# Patient Record
Sex: Female | Born: 1965 | Race: White | Hispanic: No | Marital: Married | Smoking: Former smoker
Health system: Southern US, Community
[De-identification: ages and names within clinical notes are randomized; demographics above are authoritative.]

## PROBLEM LIST (undated history)

## (undated) DIAGNOSIS — M1990 Unspecified osteoarthritis, unspecified site: Secondary | ICD-10-CM

## (undated) DIAGNOSIS — E785 Hyperlipidemia, unspecified: Secondary | ICD-10-CM

## (undated) DIAGNOSIS — D649 Anemia, unspecified: Secondary | ICD-10-CM

## (undated) DIAGNOSIS — IMO0002 Reserved for concepts with insufficient information to code with codable children: Secondary | ICD-10-CM

## (undated) DIAGNOSIS — R112 Nausea with vomiting, unspecified: Secondary | ICD-10-CM

## (undated) DIAGNOSIS — Z9889 Other specified postprocedural states: Secondary | ICD-10-CM

## (undated) DIAGNOSIS — R519 Headache, unspecified: Secondary | ICD-10-CM

## (undated) DIAGNOSIS — T7840XA Allergy, unspecified, initial encounter: Secondary | ICD-10-CM

## (undated) DIAGNOSIS — R51 Headache: Secondary | ICD-10-CM

## (undated) DIAGNOSIS — E559 Vitamin D deficiency, unspecified: Secondary | ICD-10-CM

## (undated) DIAGNOSIS — G629 Polyneuropathy, unspecified: Secondary | ICD-10-CM

## (undated) DIAGNOSIS — I10 Essential (primary) hypertension: Secondary | ICD-10-CM

## (undated) HISTORY — DX: Headache, unspecified: R51.9

## (undated) HISTORY — DX: Allergy, unspecified, initial encounter: T78.40XA

## (undated) HISTORY — DX: Anemia, unspecified: D64.9

## (undated) HISTORY — DX: Polyneuropathy, unspecified: G62.9

## (undated) HISTORY — DX: Headache: R51

## (undated) HISTORY — PX: DIAGNOSTIC LAPAROSCOPY: SUR761

## (undated) HISTORY — PX: CERVICAL DISC SURGERY: SHX588

## (undated) HISTORY — DX: Essential (primary) hypertension: I10

## (undated) HISTORY — DX: Hyperlipidemia, unspecified: E78.5

## (undated) HISTORY — PX: TONSILLECTOMY: SUR1361

## (undated) HISTORY — DX: Vitamin D deficiency, unspecified: E55.9

## (undated) HISTORY — PX: ABDOMINAL HYSTERECTOMY: SHX81

## (undated) HISTORY — DX: Reserved for concepts with insufficient information to code with codable children: IMO0002

---

## 1999-04-16 ENCOUNTER — Other Ambulatory Visit: Admission: RE | Admit: 1999-04-16 | Discharge: 1999-04-16 | Payer: Self-pay | Admitting: Obstetrics & Gynecology

## 1999-10-11 ENCOUNTER — Inpatient Hospital Stay (HOSPITAL_COMMUNITY): Admission: AD | Admit: 1999-10-11 | Discharge: 1999-10-11 | Payer: Self-pay | Admitting: Obstetrics & Gynecology

## 2000-08-12 ENCOUNTER — Other Ambulatory Visit: Admission: RE | Admit: 2000-08-12 | Discharge: 2000-08-12 | Payer: Self-pay | Admitting: Family Medicine

## 2001-09-26 ENCOUNTER — Emergency Department (HOSPITAL_COMMUNITY): Admission: EM | Admit: 2001-09-26 | Discharge: 2001-09-27 | Payer: Self-pay | Admitting: Emergency Medicine

## 2001-09-27 ENCOUNTER — Encounter: Payer: Self-pay | Admitting: Emergency Medicine

## 2001-12-13 ENCOUNTER — Encounter: Admission: RE | Admit: 2001-12-13 | Discharge: 2001-12-13 | Payer: Self-pay | Admitting: Obstetrics & Gynecology

## 2001-12-13 ENCOUNTER — Encounter: Payer: Self-pay | Admitting: Obstetrics & Gynecology

## 2002-09-27 ENCOUNTER — Other Ambulatory Visit: Admission: RE | Admit: 2002-09-27 | Discharge: 2002-09-27 | Payer: Self-pay | Admitting: Obstetrics & Gynecology

## 2004-02-08 ENCOUNTER — Emergency Department (HOSPITAL_COMMUNITY): Admission: EM | Admit: 2004-02-08 | Discharge: 2004-02-08 | Payer: Self-pay | Admitting: Emergency Medicine

## 2004-07-17 ENCOUNTER — Encounter: Admission: RE | Admit: 2004-07-17 | Discharge: 2004-07-17 | Payer: Self-pay | Admitting: Family Medicine

## 2004-08-04 ENCOUNTER — Emergency Department (HOSPITAL_COMMUNITY): Admission: EM | Admit: 2004-08-04 | Discharge: 2004-08-05 | Payer: Self-pay | Admitting: Emergency Medicine

## 2004-10-08 ENCOUNTER — Other Ambulatory Visit: Admission: RE | Admit: 2004-10-08 | Discharge: 2004-10-08 | Payer: Self-pay | Admitting: Obstetrics & Gynecology

## 2005-04-16 ENCOUNTER — Encounter: Admission: RE | Admit: 2005-04-16 | Discharge: 2005-04-16 | Payer: Self-pay | Admitting: Neurosurgery

## 2005-08-01 ENCOUNTER — Ambulatory Visit (HOSPITAL_COMMUNITY): Admission: RE | Admit: 2005-08-01 | Discharge: 2005-08-01 | Payer: Self-pay | Admitting: Neurosurgery

## 2006-12-16 ENCOUNTER — Encounter: Admission: RE | Admit: 2006-12-16 | Discharge: 2006-12-16 | Payer: Self-pay | Admitting: Neurosurgery

## 2007-12-13 ENCOUNTER — Encounter: Admission: RE | Admit: 2007-12-13 | Discharge: 2007-12-13 | Payer: Self-pay | Admitting: Obstetrics & Gynecology

## 2010-04-06 ENCOUNTER — Encounter: Payer: Self-pay | Admitting: Internal Medicine

## 2010-08-02 NOTE — Op Note (Signed)
[name], [name]              ACCOUNT NO.:  [id number]   MEDICAL RECORD NO.:  [id number]          PATIENT TYPE:  OIB   LOCATION:  3007                         FACILITY:  [facility]   PHYSICIAN:  Kathaleen Maser. Pool, M.D.    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   PROCEDURE NAME:  C6-7 anterior cervical dissection and fusion with allograft  surgical plating.   SURGEON:  Kathaleen Maser. Pool, M.D.   ASSISTANT:  Tia Alert, MD   ANESTHESIA:  General endotracheal.   INDICATIONS FOR PROCEDURE:  [name] Smith is a 45 year old female with a  history of chronic neck and right scapular pain with some intermittent right  upper extremity radiation.  She has failed a very long and exhaustive course  of conservative management.  Workup demonstrated evidence of a rightward C6-  7 disk herniation.  The patient has been counseled as to her options.  She  has decided to proceed with C6-7 anterior cervical diskectomy with fusion  allograft surgical plating in the hopes of improving her symptoms.   OPERATIVE NOTE:  The patient is taken to the operating room and placed on  the table in the supine position after an adequate level of anesthesia was  achieved.  The patient was positioned with her neck slightly extended and  held in place with halter traction.  The patient's anterior cervical region  was prepped and draped sterilely. A 10-blade was used to make a linear skin  incision overlying the C6-7 interspace.  This was carried down sharply to  the platysma.  The platysma was then divided vertically; and dissection  proceeded along the medial border of the sternocleidomastoid muscle and  carotid sheath.  The trachea and esophagus were mobilized  and retracted  towards the left. The prevertebral fascia was stripped off the anterior  __________   The longus coli muscles, were then elevated bilaterally using electrocautery  to the deep soft tissue space. Intraoperative fluoroscopy was used and the  C6-7 was confirmed.  The disk space was then incised with a 15-blade in a  rectangular fashion.  A wide disk space plane was then achieved using  pituitary rongeurs,  Kerrison rongeurs, and a high-speed drill.  All  elements of the disk were removed down to the level of the posterior  annulus.  Microscope was then brought into the field to carry out the  remainder of the diskectomy.  The remaining aspects of annulus and  osteophytes were removed using the high-speed drill, down to the level of  the posterior longitudinal ligament.  The posterior longitudinal ligament  was elevated and resected in piece meal fashion using Kerrison rongeurs  along the thecal sac and exiting.  C7 nerve roots were identified.  Wide  decompression was then performed undercutting the bodies of C6 an C7 and  wide anterior foraminotomies were performed along  the first C6 and C7 nerve  roots bilaterally.   The wound was then irrigated with antibiotic solution. Gelfoam was placed  topically for hemostasis and it was found to be good.  A 5 mm LifeNet  allograft wedge was then packed into place and recessed approximately 1 mm  from the anterior cortical margin of C6 and C7.  A 12-mm outliner anterior  cervical plate was then placed under fluoroscopic guidance using 13-mm bare  bone screws to each of the above levels.  All screws were given a final  tightening and found to be solid in bone.  A locking screw was then placed  at both levels.  Final images revealed good position of bone grafts and  hardware with __________ of the lumbar spine.  The wound was then irrigated  with antibiotic solution.  Gelfoam was placed in the operative incision and  found to be god.   Microscope and retractors were removed. Hemostasis was  achieved with electrocautery.  The wound was then closed in layers with  Vicryl sutures.  Steri-Strips and a sterile dressing were applied. The were  no perioperative complications.  The patient tolerated procedure well; and  she returns to the recovery room in stable condition.           ______________________________  Kathaleen Maser Pool, M.D.     HAP/MEDQ  D:  08/01/2005  T:  08/01/2005  Job:  [id number]

## 2011-01-08 ENCOUNTER — Other Ambulatory Visit: Payer: Self-pay | Admitting: Internal Medicine

## 2011-01-08 ENCOUNTER — Ambulatory Visit
Admission: RE | Admit: 2011-01-08 | Discharge: 2011-01-08 | Disposition: A | Discharge status: Home / Self Care | Payer: 59 | Source: Ambulatory Visit | Attending: Internal Medicine | Admitting: Internal Medicine

## 2011-01-08 DIAGNOSIS — R519 Headache, unspecified: Secondary | ICD-10-CM

## 2011-01-09 ENCOUNTER — Other Ambulatory Visit: Payer: Self-pay | Admitting: Internal Medicine

## 2011-01-09 DIAGNOSIS — R519 Headache, unspecified: Secondary | ICD-10-CM

## 2011-01-11 ENCOUNTER — Ambulatory Visit
Admission: RE | Admit: 2011-01-11 | Discharge: 2011-01-11 | Disposition: A | Discharge status: Home / Self Care | Payer: 59 | Source: Ambulatory Visit | Attending: Internal Medicine | Admitting: Internal Medicine

## 2011-01-11 DIAGNOSIS — R519 Headache, unspecified: Secondary | ICD-10-CM

## 2011-03-18 LAB — HM DEXA SCAN

## 2012-02-04 ENCOUNTER — Other Ambulatory Visit: Payer: Self-pay | Admitting: Neurosurgery

## 2012-03-16 NOTE — Pre-Procedure Instructions (Addendum)
20 [name] Smith  03/16/2012   Your procedure is scheduled on:  Monday, January 13th   Report to [facility] at 5:30 AM.  Call this number if you have problems the morning of surgery: [phone]   Remember:   Do not eat food or drink any liquids:After Midnight Sunday.    Take these medicines the morning of surgery with A SIP OF WATER: Hydrocodone   Do not wear jewelry, make-up or nail polish.  Do not wear lotions, powders, or perfumes. You may  NOT wear deodorant.  Ladies-- Do not shave 48 hours prior to surgery.              Do not bring valuables to the hospital.             Contacts, dentures or bridgework may not be worn into surgery.   Leave suitcase in the car. After surgery it may be brought to your room.  For patients admitted to the hospital, checkout time is 11:00 AM the day of discharge.   Patients discharged the day of surgery will not be allowed to drive home.   Name and phone number of your driver:    Special Instructions: Shower using CHG 2 nights before surgery and the night before surgery.  If you shower the day of surgery use CHG.  Use special wash - you have one bottle of CHG for all showers.  You should use approximately 1/3 of the bottle for each shower.   Please read over the following fact sheets that you were given: Pain Booklet, Blood Transfusion Information, MRSA Information and Surgical Site Infection Prevention

## 2012-03-18 ENCOUNTER — Encounter (HOSPITAL_COMMUNITY)
Admission: RE | Admit: 2012-03-18 | Discharge: 2012-03-18 | Disposition: A | Discharge status: Home / Self Care | Payer: 59 | Source: Ambulatory Visit | Attending: Neurosurgery | Admitting: Neurosurgery

## 2012-03-18 ENCOUNTER — Encounter (HOSPITAL_COMMUNITY): Payer: Self-pay

## 2012-03-18 HISTORY — DX: Nausea with vomiting, unspecified: Z98.890

## 2012-03-18 HISTORY — DX: Unspecified osteoarthritis, unspecified site: M19.90

## 2012-03-18 HISTORY — DX: Nausea with vomiting, unspecified: R11.2

## 2012-03-18 LAB — CBC WITH DIFFERENTIAL/PLATELET
Basophils Absolute: 0.1 10*3/uL (ref 0.0–0.1)
Basophils Relative: 1 % (ref 0–1)
Eosinophils Absolute: 0.3 10*3/uL (ref 0.0–0.7)
Eosinophils Relative: 6 % — ABNORMAL HIGH (ref 0–5)
HCT: 39.2 % (ref 36.0–46.0)
Hemoglobin: 13.3 g/dL (ref 12.0–15.0)
Lymphocytes Relative: 37 % (ref 12–46)
Lymphs Abs: 2 10*3/uL (ref 0.7–4.0)
MCH: 29.8 pg (ref 26.0–34.0)
MCHC: 33.9 g/dL (ref 30.0–36.0)
MCV: 87.9 fL (ref 78.0–100.0)
Monocytes Absolute: 0.4 10*3/uL (ref 0.1–1.0)
Monocytes Relative: 8 % (ref 3–12)
Neutro Abs: 2.6 10*3/uL (ref 1.7–7.7)
Neutrophils Relative %: 49 % (ref 43–77)
Platelets: 290 10*3/uL (ref 150–400)
RBC: 4.46 MIL/uL (ref 3.87–5.11)
RDW: 12.7 % (ref 11.5–15.5)
WBC: 5.3 10*3/uL (ref 4.0–10.5)

## 2012-03-18 LAB — SURGICAL PCR SCREEN
MRSA, PCR: NEGATIVE
Staphylococcus aureus: NEGATIVE

## 2012-03-18 NOTE — Progress Notes (Signed)
1430.Marland KitchenMarland KitchenTHURSDAY...the patient DOES not want to wear blue blood band x 6 days.  She has not had blood transfusion and understands she will have sample drawn the dos....da

## 2012-03-28 MED ORDER — CEFAZOLIN SODIUM-DEXTROSE 2-3 GM-% IV SOLR
2.0000 g | INTRAVENOUS | Status: DC
  Filled 2012-03-28: qty 50

## 2012-03-28 NOTE — Progress Notes (Signed)
Patient has penicillin allergy.

## 2012-03-29 ENCOUNTER — Encounter (HOSPITAL_COMMUNITY): Payer: Self-pay | Admitting: Neurosurgery

## 2012-03-29 ENCOUNTER — Encounter (HOSPITAL_COMMUNITY): Payer: Self-pay | Admitting: Anesthesiology

## 2012-03-29 ENCOUNTER — Inpatient Hospital Stay (HOSPITAL_COMMUNITY): Payer: 59

## 2012-03-29 ENCOUNTER — Ambulatory Visit (HOSPITAL_COMMUNITY): Payer: 59 | Admitting: Anesthesiology

## 2012-03-29 ENCOUNTER — Encounter (HOSPITAL_COMMUNITY)
Admission: RE | Disposition: A | Discharge status: Home / Self Care | Payer: Self-pay | Source: Ambulatory Visit | Attending: Neurosurgery

## 2012-03-29 ENCOUNTER — Inpatient Hospital Stay (HOSPITAL_COMMUNITY)
Admission: RE | Admit: 2012-03-29 | Discharge: 2012-03-30 | DRG: 473 | Disposition: A | Discharge status: Home / Self Care | Payer: 59 | Source: Ambulatory Visit | Attending: Neurosurgery | Admitting: Neurosurgery

## 2012-03-29 DIAGNOSIS — Z880 Allergy status to penicillin: Secondary | ICD-10-CM

## 2012-03-29 DIAGNOSIS — M19049 Primary osteoarthritis, unspecified hand: Secondary | ICD-10-CM | POA: Diagnosis present

## 2012-03-29 DIAGNOSIS — Z87891 Personal history of nicotine dependence: Secondary | ICD-10-CM

## 2012-03-29 DIAGNOSIS — Z881 Allergy status to other antibiotic agents status: Secondary | ICD-10-CM

## 2012-03-29 DIAGNOSIS — M5000 Cervical disc disorder with myelopathy, unspecified cervical region: Secondary | ICD-10-CM | POA: Diagnosis present

## 2012-03-29 DIAGNOSIS — Z981 Arthrodesis status: Secondary | ICD-10-CM

## 2012-03-29 HISTORY — PX: ANTERIOR CERVICAL DECOMP/DISCECTOMY FUSION: SHX1161

## 2012-03-29 LAB — TYPE AND SCREEN
ABO/RH(D): A POS
Antibody Screen: NEGATIVE

## 2012-03-29 LAB — ABO/RH: ABO/RH(D): A POS

## 2012-03-29 SURGERY — ANTERIOR CERVICAL DECOMPRESSION/DISCECTOMY FUSION 1 LEVEL
Anesthesia: General | Site: Neck | Laterality: Bilateral | Wound class: Clean

## 2012-03-29 MED ORDER — DEXAMETHASONE SODIUM PHOSPHATE 10 MG/ML IJ SOLN: 10.0000 mg | INTRAMUSCULAR | Status: DC

## 2012-03-29 MED ORDER — HYDROCODONE-ACETAMINOPHEN 5-325 MG PO TABS: 1.0000 | ORAL_TABLET | ORAL | Status: DC | PRN

## 2012-03-29 MED ORDER — THROMBIN 5000 UNITS EX SOLR
OROMUCOSAL | Status: DC | PRN
  Administered 2012-03-29: 09:00:00 via TOPICAL

## 2012-03-29 MED ORDER — BACITRACIN 50000 UNITS IM SOLR
INTRAMUSCULAR | Status: AC
  Filled 2012-03-29: qty 1

## 2012-03-29 MED ORDER — POLYETHYLENE GLYCOL 3350 17 G PO PACK
17.0000 g | PACK | Freq: Every day | ORAL | Status: DC | PRN
  Filled 2012-03-29: qty 1

## 2012-03-29 MED ORDER — HYDROMORPHONE HCL PF 1 MG/ML IJ SOLN
INTRAMUSCULAR | Status: AC
  Filled 2012-03-29: qty 1

## 2012-03-29 MED ORDER — HYDROMORPHONE HCL PF 1 MG/ML IJ SOLN
0.5000 mg | INTRAMUSCULAR | Status: DC | PRN
  Administered 2012-03-29: 1 mg via INTRAVENOUS
  Filled 2012-03-29 (×2): qty 1

## 2012-03-29 MED ORDER — ONDANSETRON HCL 4 MG/2ML IJ SOLN: 4.0000 mg | Freq: Four times a day (QID) | INTRAMUSCULAR | Status: DC | PRN

## 2012-03-29 MED ORDER — HEMOSTATIC AGENTS (NO CHARGE) OPTIME
TOPICAL | Status: DC | PRN
  Administered 2012-03-29: 1 via TOPICAL

## 2012-03-29 MED ORDER — ALUM & MAG HYDROXIDE-SIMETH 200-200-20 MG/5ML PO SUSP: 30.0000 mL | Freq: Four times a day (QID) | ORAL | Status: DC | PRN

## 2012-03-29 MED ORDER — DEXAMETHASONE SODIUM PHOSPHATE 10 MG/ML IJ SOLN
INTRAMUSCULAR | Status: AC
  Administered 2012-03-29: 10 mg via INTRAVENOUS
  Filled 2012-03-29: qty 1

## 2012-03-29 MED ORDER — PHENOL 1.4 % MT LIQD: 1.0000 | OROMUCOSAL | Status: DC | PRN

## 2012-03-29 MED ORDER — LACTATED RINGERS IV SOLN
INTRAVENOUS | Status: DC | PRN
  Administered 2012-03-29 (×2): via INTRAVENOUS

## 2012-03-29 MED ORDER — SODIUM CHLORIDE 0.9 % IV SOLN: 250.0000 mL | INTRAVENOUS | Status: DC

## 2012-03-29 MED ORDER — ARTIFICIAL TEARS OP OINT
TOPICAL_OINTMENT | OPHTHALMIC | Status: DC | PRN
  Administered 2012-03-29: 1 via OPHTHALMIC

## 2012-03-29 MED ORDER — ACETAMINOPHEN 650 MG RE SUPP: 650.0000 mg | RECTAL | Status: DC | PRN

## 2012-03-29 MED ORDER — OXYCODONE-ACETAMINOPHEN 5-325 MG PO TABS: 1.0000 | ORAL_TABLET | ORAL | Status: DC | PRN

## 2012-03-29 MED ORDER — ONDANSETRON HCL 4 MG/2ML IJ SOLN
4.0000 mg | INTRAMUSCULAR | Status: DC | PRN
  Administered 2012-03-29: 4 mg via INTRAVENOUS
  Filled 2012-03-29: qty 2

## 2012-03-29 MED ORDER — SENNA 8.6 MG PO TABS
1.0000 | ORAL_TABLET | Freq: Two times a day (BID) | ORAL | Status: DC
  Filled 2012-03-29 (×2): qty 1

## 2012-03-29 MED ORDER — FENTANYL CITRATE 0.05 MG/ML IJ SOLN
INTRAMUSCULAR | Status: DC | PRN
  Administered 2012-03-29 (×2): 100 ug via INTRAVENOUS

## 2012-03-29 MED ORDER — 0.9 % SODIUM CHLORIDE (POUR BTL) OPTIME
TOPICAL | Status: DC | PRN
  Administered 2012-03-29: 1000 mL

## 2012-03-29 MED ORDER — LIDOCAINE HCL (CARDIAC) 20 MG/ML IV SOLN
INTRAVENOUS | Status: DC | PRN
  Administered 2012-03-29: 100 mg via INTRAVENOUS

## 2012-03-29 MED ORDER — BISACODYL 10 MG RE SUPP: 10.0000 mg | Freq: Every day | RECTAL | Status: DC | PRN

## 2012-03-29 MED ORDER — GLYCOPYRROLATE 0.2 MG/ML IJ SOLN
INTRAMUSCULAR | Status: DC | PRN
  Administered 2012-03-29: .8 mg via INTRAVENOUS

## 2012-03-29 MED ORDER — SODIUM CHLORIDE 0.9 % IJ SOLN: 3.0000 mL | Freq: Two times a day (BID) | INTRAMUSCULAR | Status: DC

## 2012-03-29 MED ORDER — VANCOMYCIN HCL IN DEXTROSE 1-5 GM/200ML-% IV SOLN
1000.0000 mg | Freq: Once | INTRAVENOUS | Status: AC
  Administered 2012-03-29: 1000 mg via INTRAVENOUS
  Filled 2012-03-29: qty 200

## 2012-03-29 MED ORDER — OXYCODONE HCL 5 MG/5ML PO SOLN: 5.0000 mg | Freq: Once | ORAL | Status: DC | PRN

## 2012-03-29 MED ORDER — MAGNESIUM CITRATE PO SOLN
1.0000 | Freq: Once | ORAL | Status: AC | PRN
  Filled 2012-03-29: qty 296

## 2012-03-29 MED ORDER — SODIUM CHLORIDE 0.9 % IR SOLN
Status: DC | PRN
  Administered 2012-03-29: 08:00:00

## 2012-03-29 MED ORDER — SUCCINYLCHOLINE CHLORIDE 20 MG/ML IJ SOLN
INTRAMUSCULAR | Status: DC | PRN
  Administered 2012-03-29: 120 mg via INTRAVENOUS

## 2012-03-29 MED ORDER — ZOLPIDEM TARTRATE 5 MG PO TABS: 5.0000 mg | ORAL_TABLET | Freq: Every evening | ORAL | Status: DC | PRN

## 2012-03-29 MED ORDER — THROMBIN 5000 UNITS EX KIT
PACK | CUTANEOUS | Status: DC | PRN
  Administered 2012-03-29 (×2): 5000 [IU] via TOPICAL

## 2012-03-29 MED ORDER — NEOSTIGMINE METHYLSULFATE 1 MG/ML IJ SOLN
INTRAMUSCULAR | Status: DC | PRN
  Administered 2012-03-29: 5 mg via INTRAVENOUS

## 2012-03-29 MED ORDER — ACETAMINOPHEN 325 MG PO TABS: 650.0000 mg | ORAL_TABLET | ORAL | Status: DC | PRN

## 2012-03-29 MED ORDER — OXYCODONE HCL 5 MG PO TABS: 5.0000 mg | ORAL_TABLET | Freq: Once | ORAL | Status: DC | PRN

## 2012-03-29 MED ORDER — MIDAZOLAM HCL 5 MG/5ML IJ SOLN
INTRAMUSCULAR | Status: DC | PRN
  Administered 2012-03-29: 2 mg via INTRAVENOUS

## 2012-03-29 MED ORDER — CYCLOBENZAPRINE HCL 10 MG PO TABS: 10.0000 mg | ORAL_TABLET | Freq: Three times a day (TID) | ORAL | Status: DC | PRN

## 2012-03-29 MED ORDER — MENTHOL 3 MG MT LOZG: 1.0000 | LOZENGE | OROMUCOSAL | Status: DC | PRN

## 2012-03-29 MED ORDER — HYDROMORPHONE HCL PF 1 MG/ML IJ SOLN
0.2500 mg | INTRAMUSCULAR | Status: DC | PRN
  Administered 2012-03-29 (×4): 0.5 mg via INTRAVENOUS

## 2012-03-29 MED ORDER — SODIUM CHLORIDE 0.9 % IV SOLN
INTRAVENOUS | Status: AC
  Administered 2012-03-29: 08:00:00 via INTRAVENOUS
  Filled 2012-03-29: qty 500

## 2012-03-29 MED ORDER — ROCURONIUM BROMIDE 100 MG/10ML IV SOLN
INTRAVENOUS | Status: DC | PRN
  Administered 2012-03-29: 50 mg via INTRAVENOUS

## 2012-03-29 MED ORDER — SODIUM CHLORIDE 0.9 % IJ SOLN: 3.0000 mL | INTRAMUSCULAR | Status: DC | PRN

## 2012-03-29 MED ORDER — VANCOMYCIN HCL IN DEXTROSE 1-5 GM/200ML-% IV SOLN
1000.0000 mg | Freq: Once | INTRAVENOUS | Status: AC
  Administered 2012-03-29: 1000 mg via INTRAVENOUS

## 2012-03-29 MED ORDER — ONDANSETRON HCL 4 MG/2ML IJ SOLN
INTRAMUSCULAR | Status: DC | PRN
  Administered 2012-03-29 (×2): 4 mg via INTRAVENOUS

## 2012-03-29 SURGICAL SUPPLY — 59 items
APL SKNCLS STERI-STRIP NONHPOA (GAUZE/BANDAGES/DRESSINGS) ×1
BAG DECANTER FOR FLEXI CONT (MISCELLANEOUS) ×2 IMPLANT
BENZOIN TINCTURE PRP APPL 2/3 (GAUZE/BANDAGES/DRESSINGS) ×2 IMPLANT
BRUSH SCRUB EZ PLAIN DRY (MISCELLANEOUS) ×2 IMPLANT
BUR MATCHSTICK NEURO 3.0 LAGG (BURR) ×2 IMPLANT
CANISTER SUCTION 2500CC (MISCELLANEOUS) ×2 IMPLANT
CLOTH BEACON ORANGE TIMEOUT ST (SAFETY) ×2 IMPLANT
CONT SPEC 4OZ CLIKSEAL STRL BL (MISCELLANEOUS) ×2 IMPLANT
DRAPE C-ARM 42X72 X-RAY (DRAPES) ×4 IMPLANT
DRAPE LAPAROTOMY 100X72 PEDS (DRAPES) ×2 IMPLANT
DRAPE MICROSCOPE ZEISS OPMI (DRAPES) ×2 IMPLANT
DRAPE POUCH INSTRU U-SHP 10X18 (DRAPES) ×2 IMPLANT
DRILL BIT (BIT) ×2 IMPLANT
DURAPREP 6ML APPLICATOR 50/CS (WOUND CARE) ×1 IMPLANT
ELECT COATED BLADE 2.86 ST (ELECTRODE) ×2 IMPLANT
ELECT REM PT RETURN 9FT ADLT (ELECTROSURGICAL) ×2
ELECTRODE REM PT RTRN 9FT ADLT (ELECTROSURGICAL) ×1 IMPLANT
GAUZE SPONGE 4X4 16PLY XRAY LF (GAUZE/BANDAGES/DRESSINGS) IMPLANT
GLOVE BIO SURGEON STRL SZ8 (GLOVE) ×1 IMPLANT
GLOVE BIOGEL PI IND STRL 7.0 (GLOVE) IMPLANT
GLOVE BIOGEL PI IND STRL 7.5 (GLOVE) IMPLANT
GLOVE BIOGEL PI INDICATOR 7.0 (GLOVE) ×1
GLOVE BIOGEL PI INDICATOR 7.5 (GLOVE) ×1
GLOVE ECLIPSE 8.5 STRL (GLOVE) ×2 IMPLANT
GLOVE EXAM NITRILE LRG STRL (GLOVE) IMPLANT
GLOVE EXAM NITRILE MD LF STRL (GLOVE) IMPLANT
GLOVE EXAM NITRILE XL STR (GLOVE) IMPLANT
GLOVE EXAM NITRILE XS STR PU (GLOVE) IMPLANT
GLOVE SS BIOGEL STRL SZ 6.5 (GLOVE) IMPLANT
GLOVE SUPERSENSE BIOGEL SZ 6.5 (GLOVE) ×4
GOWN BRE IMP SLV AUR LG STRL (GOWN DISPOSABLE) ×2 IMPLANT
GOWN BRE IMP SLV AUR XL STRL (GOWN DISPOSABLE) ×1 IMPLANT
GOWN STRL REIN 2XL LVL4 (GOWN DISPOSABLE) IMPLANT
HEAD HALTER (SOFTGOODS) ×2 IMPLANT
HEMOSTAT POWDER SURGIFOAM 1G (HEMOSTASIS) ×1 IMPLANT
KIT BASIN OR (CUSTOM PROCEDURE TRAY) ×2 IMPLANT
KIT ROOM TURNOVER OR (KITS) ×2 IMPLANT
NDL SPNL 20GX3.5 QUINCKE YW (NEEDLE) ×1 IMPLANT
NEEDLE SPNL 20GX3.5 QUINCKE YW (NEEDLE) ×2 IMPLANT
NS IRRIG 1000ML POUR BTL (IV SOLUTION) ×2 IMPLANT
PACK LAMINECTOMY NEURO (CUSTOM PROCEDURE TRAY) ×2 IMPLANT
PAD ARMBOARD 7.5X6 YLW CONV (MISCELLANEOUS) ×6 IMPLANT
PLATE 23MM (Plate) ×1 IMPLANT
RUBBERBAND STERILE (MISCELLANEOUS) ×4 IMPLANT
SCREW ST 13X4XST VA NS SPNE (Screw) IMPLANT
SCREW ST VAR 4 ATL (Screw) ×8 IMPLANT
SPACER BONE CORNERSTONE 6X14 (Orthopedic Implant) ×1 IMPLANT
SPONGE GAUZE 4X4 12PLY (GAUZE/BANDAGES/DRESSINGS) ×2 IMPLANT
SPONGE INTESTINAL PEANUT (DISPOSABLE) ×2 IMPLANT
SPONGE SURGIFOAM ABS GEL SZ50 (HEMOSTASIS) ×2 IMPLANT
STRIP CLOSURE SKIN 1/2X4 (GAUZE/BANDAGES/DRESSINGS) ×2 IMPLANT
SUT PDS AB 5-0 P3 18 (SUTURE) ×2 IMPLANT
SUT VIC AB 3-0 SH 8-18 (SUTURE) ×2 IMPLANT
SYR 20ML ECCENTRIC (SYRINGE) ×2 IMPLANT
TAPE CLOTH 4X10 WHT NS (GAUZE/BANDAGES/DRESSINGS) ×1 IMPLANT
TAPE CLOTH SURG 4X10 WHT LF (GAUZE/BANDAGES/DRESSINGS) ×1 IMPLANT
TOWEL OR 17X24 6PK STRL BLUE (TOWEL DISPOSABLE) ×2 IMPLANT
TOWEL OR 17X26 10 PK STRL BLUE (TOWEL DISPOSABLE) ×2 IMPLANT
WATER STERILE IRR 1000ML POUR (IV SOLUTION) ×2 IMPLANT

## 2012-03-29 NOTE — Progress Notes (Signed)
Spoke with Dr Jordan Likes and informed him of pt feeling nauseated and having diarrhea since last night.  He stated he will see her in holding.  Also informed him of pts allergy to Penicillin and new order received.

## 2012-03-29 NOTE — Brief Op Note (Signed)
03/29/2012  9:17 AM  PATIENT:  [name] Smith  47 y.o. female  PRE-OPERATIVE DIAGNOSIS:  Cervical four-five herniated nucleus pulposus  POST-OPERATIVE DIAGNOSIS:  Cervical four-five herniated nucleus pulposus  PROCEDURE:  Procedure(s) (LRB) with comments: ANTERIOR CERVICAL DECOMPRESSION/DISCECTOMY FUSION 1 LEVEL (Bilateral) - Cervical four-five anterior cervical discectomy with fusion   SURGEON:  Surgeon(s) and Role:    * Temple Pacini, MD - Primary    * Mariam Dollar, MD - Assisting  PHYSICIAN ASSISTANT:   ASSISTANTS:    ANESTHESIA:   Gen.  EBL:  Total I/O In: 1000 [I.V.:1000] Out: 100 [Blood:100]  BLOOD ADMINISTERED:none  DRAINS: none   LOCAL MEDICATIONS USED:  NONE  SPECIMEN:  No Specimen  DISPOSITION OF SPECIMEN:  N/A  COUNTS:  YES  TOURNIQUET:  * No tourniquets in log *  DICTATION: .Dragon Dictation  PLAN OF CARE: Admit to inpatient   PATIENT DISPOSITION:  PACU - hemodynamically stable.   Delay start of Pharmacological VTE agent (>24hrs) due to surgical blood loss or risk of bleeding: yes

## 2012-03-29 NOTE — Anesthesia Preprocedure Evaluation (Addendum)
Anesthesia Evaluation  Patient identified by MRN, date of birth, ID band Patient awake    Reviewed: Allergy & Precautions, H&P , NPO status , Patient's Chart, lab work & pertinent test results  History of Anesthesia Complications (+) PONV  Airway Mallampati: II TM Distance: <3 FB Neck ROM: full    Dental  (+) Dental Advisory Given   Pulmonary former smoker,          Cardiovascular     Neuro/Psych    GI/Hepatic   Endo/Other    Renal/GU      Musculoskeletal  (+) Arthritis -,   Abdominal   Peds  Hematology   Anesthesia Other Findings   Reproductive/Obstetrics                          Anesthesia Physical Anesthesia Plan  ASA: II  Anesthesia Plan: General   Post-op Pain Management:    Induction: Intravenous  Airway Management Planned: Oral ETT  Additional Equipment:   Intra-op Plan:   Post-operative Plan: Extubation in OR  Informed Consent: I have reviewed the patients History and Physical, chart, labs and discussed the procedure including the risks, benefits and alternatives for the proposed anesthesia with the patient or authorized representative who has indicated his/her understanding and acceptance.     Plan Discussed with: CRNA and Surgeon  Anesthesia Plan Comments:         Anesthesia Quick Evaluation

## 2012-03-29 NOTE — Anesthesia Procedure Notes (Signed)
Procedure Name: Intubation Date/Time: 03/29/2012 8:05 AM Performed by: Sherie Don Pre-anesthesia Checklist: Patient identified, Emergency Drugs available, Suction available, Patient being monitored and Timeout performed Patient Re-evaluated:Patient Re-evaluated prior to inductionOxygen Delivery Method: Circle system utilized Preoxygenation: Pre-oxygenation with 100% oxygen Intubation Type: IV induction and Rapid sequence Laryngoscope Size: Mac and 3 Grade View: Grade III Tube type: Oral Tube size: 7.0 mm Number of attempts: 1 Airway Equipment and Method: Stylet Placement Confirmation: ETT inserted through vocal cords under direct vision,  positive ETCO2 and breath sounds checked- equal and bilateral (posterior arytenoids anterir larynx) Secured at: 22 cm Tube secured with: Tape Dental Injury: Teeth and Oropharynx as per pre-operative assessment  Difficulty Due To: Difficult Airway- due to anterior larynx Future Recommendations: Recommend- induction with short-acting agent, and alternative techniques readily available

## 2012-03-29 NOTE — Transfer of Care (Signed)
Immediate Anesthesia Transfer of Care Note  Patient: [name] Smith  Procedure(s) Performed: Procedure(s) (LRB) with comments: ANTERIOR CERVICAL DECOMPRESSION/DISCECTOMY FUSION 1 LEVEL (Bilateral) - Cervical four-five anterior cervical discectomy with fusion   Patient Location: PACU  Anesthesia Type:General  Level of Consciousness: alert  and patient cooperative  Airway & Oxygen Therapy: Patient Spontanous Breathing and Patient connected to nasal cannula oxygen  Post-op Assessment: Report given to PACU RN and Patient moving all extremities X 4  Post vital signs: Reviewed and stable  Complications: No apparent anesthesia complications

## 2012-03-29 NOTE — Op Note (Signed)
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  Asst. Surgeon: Wynetta Emery  Anesthesia: General  Indication: 47 year old female with neck and bilateral upper extremity symptoms of pain paresthesias and weakness consistent with an early cervical myelopathy. Patient is status post previous C6-7 anterior cervical discectomy and fusion. Workup demonstrates evidence of a significant paracentral disc herniation on the right at C4-5 with spinal cord compression. Patient presents now for anterior cervical discectomy and fusion in hopes of improving her symptoms.  Operative note: After induction anesthesia, patient positioned supine with her neck slightly extended and held in place with halter traction. Patient's anterior cervical region was prepped and draped sterilely. 10 blade used to make a linear skin incision overlying the C5-6 vertebral level. This carried down sharply to the platysma. Is then divided vertically and dissection proceeded on the medial border of the sternocleidomastoid muscle and carotid sheath. Trachea and esophagus are mobilized track towards the left. Prevertebral fascia struck the anterior spinal column. Longus colli muscle belly bilaterally/Carter. Deep self-retaining traction placed intraoperative fluoroscopy is used and the C4-5 level was confirmed. The space and incised 15 blade in a rectangular fashion. Y. disc space clear was achieved using pituitary rongeurs for tobacco progress Kerrison rongeurs and a high-speed drill. All its the disc removed down to the level posterior annulus. Marked and brought field these at the remainder of the discectomy remaining aspects of annulus and osteophytes removed using a high-speed drill  down to level posterior laterally. Posterior lateral and was elevated and resected piecemeal fashion using Kerrison rongeurs. Underlying thecal sac was identified. Wide central decompression then performed by undercutting the bodies of C4 and C5. Decompression then proceeded out each neural foramen. Wide anterior foraminotomies were then performed along the course exiting C5 nerve roots bilaterally. This went very thorough decompression had been achieved. There is no his injury to thecal sac and nerve roots. Wounds that area out solution. Gelfoam was placed topically for hemostasis which was found to be good. 6 mm cornerstone allograft wedges and packed into place recessed roughly 1 mm from the anterior cortical margin. 23 mm Atlantis anterior cervical plate is then placed over the C4 and C5 levels. This infection or fluoroscopic guidance using 13 mm variable screws 2 each at both levels. All 4 screws given a final tightening be solidly within bone. Locking screws were then engaged both levels. Final images revealed good position the bone graft and hardware at proper upper level with normal lamina spine. Wounds an area and one final time. Hemostasis was ensured with bipolar try repaired was and close in layers with Vicryl sutures. Steri-Strips and sterile dressing were applied. There were no apparent locations. Patient upper well and she returns to the recovery postop.

## 2012-03-29 NOTE — H&P (Signed)
[name] Smith is an 47 y.o. female.   Chief Complaint: Neck pain bilateral upper extremity pain and numbness HPI: 47 year old female with a remote history of a C6-7 anterior cervical discectomy and fusion with allograft or plating with good overall result presents now with worsening neck pain and bilateral upper extremity numbness paresthesias and weakness consistent with an early cervical myelopathy. Patient recently began having some lower tray symptoms which are likely secondary to her spinal cord compression as well. Workup demonstrates evidence of a right paracentral disc herniation at C4-5 with significant spinal cord compression. The remainder of her cervical spine was quite healthy.    Past Medical History  Diagnosis Date  . PONV (postoperative nausea and vomiting)   . Arthritis     in hands    Past Surgical History  Procedure Date  . Diagnostic laparoscopy   . Abdominal hysterectomy   . Tonsillectomy   . Cervical disc surgery     No family history on file. Social History:  reports that she quit smoking about 7 months ago. She does not have any smokeless tobacco history on file. She reports that she does not drink alcohol or use illicit drugs.  Allergies:  Allergies  Allergen Reactions  . Erythromycin Hives  . Penicillins Hives  . Tetracyclines & Related Hives    Medications Prior to Admission  Medication Sig Dispense Refill  . Cholecalciferol (VITAMIN D) 2000 UNITS CAPS Take 2 capsules by mouth 2 (two) times daily.      Marland Kitchen HYDROcodone-acetaminophen (NORCO/VICODIN) 5-325 MG per tablet Take 1 tablet by mouth every 6 (six) hours as needed. For pain        No results found for this or any previous visit (from the past 48 hour(s)). No results found.  Review of Systems  Constitutional: Negative.   HENT: Negative.   Eyes: Negative.   Respiratory: Negative.   Cardiovascular: Negative.   Gastrointestinal: Negative.   Genitourinary: Negative.   Musculoskeletal:  Negative.   Skin: Negative.   Neurological: Negative.   Endo/Heme/Allergies: Negative.   Psychiatric/Behavioral: Negative.     Blood pressure 123/83, pulse 67, temperature 97.7 F (36.5 C), temperature source Oral, resp. rate 18, SpO2 100.00%. Physical Exam  Constitutional: She is oriented to person, place, and time. She appears well-developed and well-nourished.  HENT:  Head: Normocephalic and atraumatic.  Right Ear: External ear normal.  Left Ear: External ear normal.  Nose: Nose normal.  Mouth/Throat: Oropharynx is clear and moist.  Eyes: Conjunctivae normal and EOM are normal. Pupils are equal, round, and reactive to light. Right eye exhibits no discharge. Left eye exhibits no discharge.  Neck: Normal range of motion. Neck supple. No tracheal deviation present. No thyromegaly present.  Cardiovascular: Normal rate, regular rhythm, normal heart sounds and intact distal pulses.  Exam reveals no friction rub.   No murmur heard. Respiratory: Effort normal and breath sounds normal. No respiratory distress. She has no wheezes.  GI: Soft. Bowel sounds are normal. She exhibits no distension. There is no tenderness.  Musculoskeletal: Normal range of motion. She exhibits no edema and no tenderness.  Neurological: She is alert and oriented to person, place, and time. She has normal reflexes. She displays normal reflexes. No cranial nerve deficit. She exhibits normal muscle tone. Coordination normal.  Skin: Skin is warm and dry. No rash noted. No erythema. No pallor.  Psychiatric: She has a normal mood and affect. Her behavior is normal. Judgment and thought content normal.     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 Plan C4-5 anterior cervical setting fusion without after plating. Risks and benefits been explained. Patient wishes to proceed.  Verdie Wilms A 03/29/2012, 7:40 AM

## 2012-03-29 NOTE — Progress Notes (Signed)
ANTIBIOTIC CONSULT NOTE - INITIAL  Pharmacy Consult for Vancomycin  Indication: Surgical prophylaxis  Allergies  Allergen Reactions  . Erythromycin Hives  . Penicillins Hives  . Tetracyclines & Related Hives    Patient Measurements:   Adjusted Body Weight: 61 kg  Vital Signs: Temp: 97.7 F (36.5 C) (01/13 1116) Temp src: Oral (01/13 0603) BP: 132/79 mmHg (01/13 1116) Pulse Rate: 72  (01/13 1116) Intake/Output from previous day:   Intake/Output from this shift: Total I/O In: 2100 [I.V.:2100] Out: 100 [Blood:100]  Labs: No results found for this basename: WBC:3,HGB:3,PLT:3,LABCREA:3,CREATININE:3 in the last 72 hours CrCl is unknown because no creatinine reading has been taken and the patient has no height on file. No results found for this basename: VANCOTROUGH:2,VANCOPEAK:2,VANCORANDOM:2,GENTTROUGH:2,GENTPEAK:2,GENTRANDOM:2,TOBRATROUGH:2,TOBRAPEAK:2,TOBRARND:2,AMIKACINPEAK:2,AMIKACINTROU:2,AMIKACIN:2, in the last 72 hours   Microbiology: Recent Results (from the past 720 hour(s))  SURGICAL PCR SCREEN     Status: Normal   Collection Time   03/18/12  2:42 PM      Component Value Range Status Comment   MRSA, PCR NEGATIVE  NEGATIVE Final    Staphylococcus aureus NEGATIVE  NEGATIVE Final     Medical History: Past Medical History  Diagnosis Date  . PONV (postoperative nausea and vomiting)   . Arthritis     in hands    Assessment: 89 YOF admitted for anterior cervical discectomy and fusion today, pharmacy is consulted to dose vancomycin for post-op prophylaxis (penicillin allergy). Patient received pre-op vancomycin 1g at ~ 0800 this morning. No drain in place confirmed with RN. No BMET in chart, but patient doesn't have history of kidney problems.    Goal of Therapy:  Prevent surgical infections.  Plan:  - Vancomycin 1g IV x 1 at 2000 (12 hrs after pre-op dose) - Pharmacy sign off.  Bayard Hugger, PharmD, BCPS  Clinical Pharmacist  Pager: [phone]  03/29/2012,11:38  AM

## 2012-03-29 NOTE — Anesthesia Postprocedure Evaluation (Signed)
Anesthesia Post Note  Patient: [name] Smith  Procedure(s) Performed: Procedure(s) (LRB): ANTERIOR CERVICAL DECOMPRESSION/DISCECTOMY FUSION 1 LEVEL (Bilateral)  Anesthesia type: General  Patient location: PACU  Post pain: Pain level controlled and Adequate analgesia  Post assessment: Post-op Vital signs reviewed, Patient's Cardiovascular Status Stable, Respiratory Function Stable, Patent Airway and Pain level controlled  Last Vitals:  Filed Vitals:   03/29/12 1045  BP: 114/55  Pulse: 64  Temp: 36.4 C  Resp: 9    Post vital signs: Reviewed and stable  Level of consciousness: awake, alert  and oriented  Complications: No apparent anesthesia complications

## 2012-03-29 NOTE — Preoperative (Signed)
Beta Blockers   Reason not to administer Beta Blockers:Not Applicable 

## 2012-03-30 ENCOUNTER — Encounter (HOSPITAL_COMMUNITY): Payer: Self-pay | Admitting: Neurosurgery

## 2012-03-30 MED ORDER — HYDROCODONE-ACETAMINOPHEN 5-325 MG PO TABS: 1.0000 | ORAL_TABLET | ORAL | Status: DC | PRN

## 2012-03-30 MED ORDER — CYCLOBENZAPRINE HCL 10 MG PO TABS: 10.0000 mg | ORAL_TABLET | Freq: Three times a day (TID) | ORAL | Status: DC | PRN

## 2012-03-30 NOTE — Discharge Summary (Signed)
Physician Discharge Summary  Patient ID: [name] Smith MRN: [id number] DOB/AGE: 1965-10-11 47 y.o.  Admit date: 03/29/2012 Discharge date: 03/30/2012  Admission Diagnoses:  Discharge Diagnoses:  Principal Problem:  *Herniated nucleus pulposus with myelopathy, cervical   Discharged Condition: good  Hospital Course: Admitted to hospital where she underwent uncomplicated C4-5 anterior cervical decompression and fusion. Postoperatively she is done well. Neck and upper trimming symptoms much improved. Strength sensation intact. Wound healing well. Ready for discharge home.  Consults:   Significant Diagnostic Studies:   Treatments:   Discharge Exam: Blood pressure 108/70, pulse 94, temperature 99.8 F (37.7 C), temperature source Oral, resp. rate 16, SpO2 96.00%. Awake and alert oriented and appropriate. Cranial nerve function is intact. Motor and sensory function of the extremities normal. Wound clean dry and intact. Next soft airway midline chest and abdomen benign.  Disposition:      Medication List     As of 03/30/2012 11:12 AM    TAKE these medications         cyclobenzaprine 10 MG tablet   Commonly known as: FLEXERIL   Take 1 tablet (10 mg total) by mouth 3 (three) times daily as needed for muscle spasms.      HYDROcodone-acetaminophen 5-325 MG per tablet   Commonly known as: NORCO/VICODIN   Take 1 tablet by mouth every 6 (six) hours as needed. For pain      HYDROcodone-acetaminophen 5-325 MG per tablet   Commonly known as: NORCO/VICODIN   Take 1-2 tablets by mouth every 4 (four) hours as needed.      Vitamin D 2000 UNITS Caps   Take 2 capsules by mouth 2 (two) times daily.           Follow-up Information    Follow up with Bevin Das A, MD. Call in 1 week. (Ask for Lurena Joiner)    Contact information:   [address] [place] [address] [phone]          Signed: Tyeasha Ebbs A 03/30/2012, 11:12 AM

## 2012-03-30 NOTE — Progress Notes (Signed)
Pt given D/C instructions with Rx's, verbal understanding given. Pt D/C'd home via wheelchair per MD order. Loralye Loberg, RN 

## 2012-06-15 LAB — HM PAP SMEAR

## 2012-06-15 LAB — HM MAMMOGRAPHY

## 2013-01-26 ENCOUNTER — Encounter: Payer: Self-pay | Admitting: Internal Medicine

## 2013-01-26 DIAGNOSIS — M5030 Other cervical disc degeneration, unspecified cervical region: Secondary | ICD-10-CM | POA: Insufficient documentation

## 2013-01-26 DIAGNOSIS — E785 Hyperlipidemia, unspecified: Secondary | ICD-10-CM

## 2013-01-26 DIAGNOSIS — IMO0002 Reserved for concepts with insufficient information to code with codable children: Secondary | ICD-10-CM

## 2013-01-26 DIAGNOSIS — E559 Vitamin D deficiency, unspecified: Secondary | ICD-10-CM

## 2013-01-26 DIAGNOSIS — D649 Anemia, unspecified: Secondary | ICD-10-CM

## 2013-01-27 ENCOUNTER — Ambulatory Visit: Payer: Self-pay | Admitting: Emergency Medicine

## 2013-02-14 ENCOUNTER — Ambulatory Visit: Payer: Self-pay | Admitting: Emergency Medicine

## 2013-03-28 ENCOUNTER — Encounter: Payer: Self-pay | Admitting: Emergency Medicine

## 2013-03-28 ENCOUNTER — Ambulatory Visit (INDEPENDENT_AMBULATORY_CARE_PROVIDER_SITE_OTHER): Payer: 59 | Admitting: Emergency Medicine

## 2013-03-28 VITALS — BP 122/70 | HR 80 | Temp 98.6°F | Resp 16 | Ht 64.5 in | Wt 140.0 lb

## 2013-03-28 DIAGNOSIS — M549 Dorsalgia, unspecified: Secondary | ICD-10-CM

## 2013-03-28 DIAGNOSIS — R251 Tremor, unspecified: Secondary | ICD-10-CM

## 2013-03-28 DIAGNOSIS — R209 Unspecified disturbances of skin sensation: Secondary | ICD-10-CM

## 2013-03-28 DIAGNOSIS — R202 Paresthesia of skin: Secondary | ICD-10-CM

## 2013-03-28 DIAGNOSIS — E559 Vitamin D deficiency, unspecified: Secondary | ICD-10-CM

## 2013-03-28 DIAGNOSIS — R5381 Other malaise: Secondary | ICD-10-CM

## 2013-03-28 DIAGNOSIS — R5383 Other fatigue: Secondary | ICD-10-CM

## 2013-03-28 DIAGNOSIS — E538 Deficiency of other specified B group vitamins: Secondary | ICD-10-CM

## 2013-03-28 DIAGNOSIS — M25559 Pain in unspecified hip: Secondary | ICD-10-CM

## 2013-03-28 LAB — CBC WITH DIFFERENTIAL/PLATELET
Basophils Absolute: 0.1 10*3/uL (ref 0.0–0.1)
Basophils Relative: 1 % (ref 0–1)
Eosinophils Absolute: 0.1 10*3/uL (ref 0.0–0.7)
Eosinophils Relative: 1 % (ref 0–5)
HCT: 38.7 % (ref 36.0–46.0)
Hemoglobin: 13.2 g/dL (ref 12.0–15.0)
Lymphocytes Relative: 28 % (ref 12–46)
Lymphs Abs: 1.8 10*3/uL (ref 0.7–4.0)
MCH: 30.6 pg (ref 26.0–34.0)
MCHC: 34.1 g/dL (ref 30.0–36.0)
MCV: 89.8 fL (ref 78.0–100.0)
Monocytes Absolute: 0.4 10*3/uL (ref 0.1–1.0)
Monocytes Relative: 6 % (ref 3–12)
Neutro Abs: 4 10*3/uL (ref 1.7–7.7)
Neutrophils Relative %: 64 % (ref 43–77)
Platelets: 326 10*3/uL (ref 150–400)
RBC: 4.31 MIL/uL (ref 3.87–5.11)
RDW: 12.9 % (ref 11.5–15.5)
WBC: 6.3 10*3/uL (ref 4.0–10.5)

## 2013-03-28 NOTE — Patient Instructions (Signed)
Tremor  Tremor is a rhythmic, involuntary muscular contraction characterized by oscillations (to-and-fro movements) of a part of the body. The most common of all involuntary movements, tremor can affect various body parts such as the hands, head, facial structures, vocal cords, trunk, and legs; most tremors, however, occur in the hands. Tremor often accompanies neurological disorders associated with aging. Although the disorder is not life-threatening, it can be responsible for functional disability and social embarrassment.  TREATMENT   There are many types of tremor and several ways in which tremor is classified. The most common classification is by behavioral context or position. There are five categories of tremor within this classification: resting, postural, kinetic, task-specific, and psychogenic. Resting or static tremor occurs when the muscle is at rest, for example when the hands are lying on the lap. This type of tremor is often seen in patients with Parkinson's disease. Postural tremor occurs when a patient attempts to maintain posture, such as holding the hands outstretched. Postural tremors include physiological tremor, essential tremor, tremor with basal ganglia disease (also seen in patients with Parkinson's disease), cerebellar postural tremor, tremor with peripheral neuropathy, post-traumatic tremor, and alcoholic tremor. Kinetic or intention (action) tremor occurs during purposeful movement, for example during finger-to-nose testing. Task-specific tremor appears when performing goal-oriented tasks such as handwriting, speaking, or standing. This group consists of primary writing tremor, vocal tremor, and orthostatic tremor. Psychogenic tremor occurs in both older and younger patients. The key feature of this tremor is that it dramatically lessens or disappears when the patient is distracted.  PROGNOSIS  There are some treatment options available for tremor; the appropriate treatment depends on  accurate diagnosis of the cause. Some tremors respond to treatment of the underlying condition, for example in some cases of psychogenic tremor treating the patient's underlying mental problem may cause the tremor to disappear. Also, patients with tremor due to Parkinson's disease may be treated with Levodopa drug therapy. Symptomatic drug therapy is available for several other tremors as well. For those cases of tremor in which there is no effective drug treatment, physical measures such as teaching the patient to brace the affected limb during the tremor are sometimes useful. Surgical intervention such as thalamotomy or deep brain stimulation may be useful in certain cases.  Document Released: 02/21/2002 Document Revised: 05/26/2011 Document Reviewed: 03/03/2005  Exit[employer]® Patient Information ©2014 [employer].

## 2013-03-28 NOTE — Progress Notes (Signed)
Subjective:    Patient ID: [name] Smith, female    DOB: 12/24/1965, 48 y.o.   MRN: [id number]  HPI Comments: 48 yo female notes she has had multiple back problems and is frustrated because Dr Dutch QuintPoole neurosurgeon is not helping with symptoms. She notes feels like shaking on inside/ outside, looks like Parkinson's tremor when it occurs. Cymbalta made symptoms worse with shaking. She has to take Norco 5/325 in order to sleep. She notes unusual tingling sensations in different areas of body without triggers. She is fatigued due to sleep difficulties. She notes tremor R>L hand. SHE ALSO NOTES SHE LIVES IN [facility] HOME.     Medication List       This list is accurate as of: 03/28/13 10:21 PM.  Always use your most recent med list.               b complex vitamins tablet  Take 1 tablet by mouth daily.     HYDROcodone-acetaminophen 5-325 MG per tablet  Commonly known as:  NORCO/VICODIN  Take 1-2 tablets by mouth every 4 (four) hours as needed.     Magnesium 250 MG Tabs  Take 250 mg by mouth daily.     Vitamin D 2000 UNITS Caps  Take 2 capsules by mouth 2 (two) times daily.        ALLERGIES Erythromycin; Penicillins; Prednisone; and Tetracyclines & related    Past Medical History  Diagnosis Date  . PONV (postoperative nausea and vomiting)   . Arthritis     in hands  . Anemia   . Hyperlipidemia   . Unspecified vitamin D deficiency   . Headache   . Allergy   . DDD (degenerative disc disease)     Review of Systems  Constitutional: Positive for fatigue.  Musculoskeletal: Positive for back pain.  Neurological: Positive for tremors.  All other systems reviewed and are negative.   BP 122/70  Pulse 80  Temp(Src) 98.6 F (37 C) (Temporal)  Resp 16  Ht 5' 4.5" (1.638 m)  Wt 140 lb (63.504 kg)  BMI 23.67 kg/m2     Objective:   Physical Exam  Nursing note and vitals reviewed. Constitutional: She is oriented to person, place, and time. She appears well-developed and  well-nourished. No distress.  HENT:  Head: Normocephalic and atraumatic.  Right Ear: External ear normal.  Left Ear: External ear normal.  Nose: Nose normal.  Mouth/Throat: Oropharynx is clear and moist. No oropharyngeal exudate.  Eyes: Conjunctivae and EOM are normal.  Neck: Normal range of motion. Neck supple. No JVD present. No thyromegaly present.  Cardiovascular: Normal rate, regular rhythm, normal heart sounds and intact distal pulses.   Pulmonary/Chest: Effort normal and breath sounds normal.  Abdominal: Soft. Bowel sounds are normal. She exhibits no distension and no mass. There is no tenderness. There is no rebound and no guarding.  Musculoskeletal: Normal range of motion. She exhibits no edema and no tenderness.  Lymphadenopathy:    She has no cervical adenopathy.  Neurological: She is alert and oriented to person, place, and time. She has normal reflexes. No cranial nerve deficit. Coordination normal.  Skin: Skin is warm and dry. No rash noted. No erythema. No pallor.  Psychiatric: She has a normal mood and affect. Her behavior is normal. Judgment and thought content normal.          Assessment & Plan:  1. Back pain vs tingling vs tremors vs vit def- check labs, if neg ? Ref Neurology not  neurosurgery 2. Fatigue- check labs

## 2013-03-29 LAB — IRON AND TIBC
%SAT: 28 % (ref 20–55)
Iron: 81 ug/dL (ref 42–145)
TIBC: 294 ug/dL (ref 250–470)
UIBC: 213 ug/dL (ref 125–400)

## 2013-03-29 LAB — LYME ABY, WSTRN BLT IGG & IGM W/BANDS

## 2013-03-29 LAB — BASIC METABOLIC PANEL WITH GFR
BUN: 13 mg/dL (ref 6–23)
CO2: 27 mEq/L (ref 19–32)
Calcium: 9.8 mg/dL (ref 8.4–10.5)
Chloride: 102 mEq/L (ref 96–112)
Creat: 0.62 mg/dL (ref 0.50–1.10)
GFR, Est African American: >89 mL/min
GFR, Est Non African American: >89 mL/min
Glucose, Bld: 93 mg/dL (ref 70–99)
Potassium: 4.7 mEq/L (ref 3.5–5.3)
Sodium: 137 mEq/L (ref 135–145)

## 2013-03-29 LAB — HEPATIC FUNCTION PANEL
ALT: 18 U/L (ref 0–35)
AST: 20 U/L (ref 0–37)
Albumin: 4.5 g/dL (ref 3.5–5.2)
Alkaline Phosphatase: 57 U/L (ref 39–117)
Bilirubin, Direct: 0.1 mg/dL (ref 0.0–0.3)
Indirect Bilirubin: 0.3 mg/dL (ref 0.0–0.9)
Total Bilirubin: 0.4 mg/dL (ref 0.3–1.2)
Total Protein: 7.1 g/dL (ref 6.0–8.3)

## 2013-03-29 LAB — HSV(HERPES SIMPLEX VRS) I + II AB-IGG
HSV 1 Glycoprotein G Ab, IgG: 0.11 IV
HSV 2 Glycoprotein G Ab, IgG: 0.15 IV

## 2013-03-29 LAB — MAGNESIUM: Magnesium: 2 mg/dL (ref 1.5–2.5)

## 2013-03-29 LAB — RPR

## 2013-03-29 LAB — ROCKY MTN SPOTTED FVR ABS PNL(IGG+IGM)
RMSF IgG: 0.17 IV
RMSF IgM: 0.35 IV

## 2013-03-29 LAB — VITAMIN D 25 HYDROXY (VIT D DEFICIENCY, FRACTURES): Vit D, 25-Hydroxy: 32 ng/mL (ref 30–89)

## 2013-03-29 LAB — FOLATE RBC: RBC Folate: 391 ng/mL (ref >280)

## 2013-03-29 LAB — VITAMIN B12: Vitamin B-12: 337 pg/mL (ref 211–911)

## 2013-03-29 LAB — TSH: TSH: 0.677 u[IU]/mL (ref 0.350–4.500)

## 2013-03-30 LAB — HEAVY METALS, BLOOD
Arsenic: <3 mcg/L (ref <23)
Lead: <2 ug/dL (ref <10)
Mercury, B: <4 mcg/L (ref <=10)

## 2013-04-05 ENCOUNTER — Ambulatory Visit (INDEPENDENT_AMBULATORY_CARE_PROVIDER_SITE_OTHER): Payer: 59 | Admitting: Neurology

## 2013-04-05 ENCOUNTER — Encounter: Payer: Self-pay | Admitting: Neurology

## 2013-04-05 VITALS — BP 116/70 | HR 76 | Temp 97.9°F | Ht 64.0 in | Wt 139.0 lb

## 2013-04-05 DIAGNOSIS — M542 Cervicalgia: Secondary | ICD-10-CM

## 2013-04-05 DIAGNOSIS — R259 Unspecified abnormal involuntary movements: Secondary | ICD-10-CM

## 2013-04-05 DIAGNOSIS — R202 Paresthesia of skin: Secondary | ICD-10-CM

## 2013-04-05 DIAGNOSIS — R209 Unspecified disturbances of skin sensation: Secondary | ICD-10-CM

## 2013-04-05 DIAGNOSIS — R251 Tremor, unspecified: Secondary | ICD-10-CM

## 2013-04-05 LAB — RPR

## 2013-04-05 LAB — VITAMIN B12: Vitamin B-12: 316 pg/mL (ref 211–911)

## 2013-04-05 NOTE — Progress Notes (Signed)
Subjective:    [name] Smith was seen in consultation in the movement disorder clinic at the request of MCKEOWN,WILLIAM DAVID, MD.  The evaluation is for tremor.  She is accompanied by her husband who supplements the hx.    The patient is a 48 y.o. right handed female with a history of tremor.  She reported that after neck surgery last year, she began to paresthesias in both legs.  After a while, she began to have paresthesias in the arms and face. They are present all of the time but she cannot state if it involves all of the fingers/hand.  It seems to be more of the backs of the legs all the way to the feet bilaterally.  She also c/o that tremor began after she had an epidural for LBP.   She feels that she is shaking on the inside but she and her husband also note it on the outside.  She notes the tremor most when trying to hold something.  However, she feels/sees it at rest as well.  She notes no tremor in the legs.  There is no family hx of tremor.    She does have hx of migraine, described as pressure.  It is initially R sided and becomes holocephalic.  She describes it as pressure.  She has nausea.  No emesis.  She has photophobia/phonophobia.  It occurs one time per month.  She has never used triptans.    Affected by caffeine:  no Affected by alcohol:  unknown Affected by stress:  yes Affected by fatigue:  no Spills soup if on spoon:  no Spills glass of liquid if full:  no Affects ADL's (tying shoes, brushing teeth, etc):  no  Current/Previously tried tremor medications: n/a  Current medications that may exacerbate tremor:  n/a  Outside reports reviewed: historical medical records.  Allergies  Allergen Reactions  . Erythromycin Hives  . Penicillins Hives  . Prednisone Other (See Comments)    pain  . Tetracyclines & Related Hives    Current Outpatient Prescriptions on File Prior to Visit  Medication Sig Dispense Refill  . b complex vitamins tablet Take 1 tablet by mouth  daily.      . Cholecalciferol (VITAMIN D) 2000 UNITS CAPS Take 2 capsules by mouth 2 (two) times daily.      Marland Kitchen HYDROcodone-acetaminophen (NORCO/VICODIN) 5-325 MG per tablet Take 1-2 tablets by mouth every 4 (four) hours as needed.  60 tablet  1  . Magnesium 250 MG TABS Take 250 mg by mouth daily.       No current facility-administered medications on file prior to visit.    Past Medical History  Diagnosis Date  . PONV (postoperative nausea and vomiting)   . Arthritis     in hands  . Anemia   . Hyperlipidemia   . Unspecified vitamin D deficiency   . Headache   . Allergy   . DDD (degenerative disc disease)     Past Surgical History  Procedure Laterality Date  . Diagnostic laparoscopy    . Abdominal hysterectomy    . Tonsillectomy    . Cervical disc surgery    . Anterior cervical decomp/discectomy fusion  03/29/2012    Procedure: ANTERIOR CERVICAL DECOMPRESSION/DISCECTOMY FUSION 1 LEVEL;  Surgeon: Temple Pacini, MD;  Location: [facility];  Service: Neurosurgery;  Laterality: Bilateral;  Cervical four-five anterior cervical discectomy with fusion   . Cesarean section      History   Social History  .  Marital Status: Married    Spouse Name: N/A    Number of Children: N/A  . Years of Education: N/A   Occupational History  . Not on file.   Social History Main Topics  . Smoking status: Former Smoker -- 3 years    Quit date: 08/17/2011  . Smokeless tobacco: Not on file  . Alcohol Use: No  . Drug Use: No  . Sexual Activity: Yes    Birth Control/ Protection: Surgical     Comment: 1 cig every 2-3 dyas   Other Topics Concern  . Not on file   Social History Narrative  . No narrative on file    Family Status  Relation Status Death Age  . Mother Alive   . Father Alive   . Brother Alive   . Daughter Alive   . Son Alive   . Son Alive     Review of Systems A complete 10 system ROS was obtained and was negative apart from what is mentioned.   Objective:   VITALS:    Filed Vitals:   04/05/13 1019  BP: 116/70  Pulse: 76  Temp: 97.9 F (36.6 C)  TempSrc: Oral  Height: 5\' 4"  (1.626 m)  Weight: 139 lb (63.05 kg)   Gen:  Appears stated age and in NAD. HEENT:  Normocephalic, atraumatic. The mucous membranes are moist. The superficial temporal arteries are without ropiness or tenderness. Cardiovascular: Regular rate and rhythm. Lungs: Clear to auscultation bilaterally. Neck: There are no carotid bruits noted bilaterally.  NEUROLOGICAL:  Orientation:  The patient is alert and oriented x 3.  Recent and remote memory are intact.  Attention span and concentration are normal.  Able to name objects and repeat without trouble.  Fund of knowledge is appropriate Cranial nerves: There is good facial symmetry. The pupils are equal round and reactive to light bilaterally. Fundoscopic exam reveals clear disc margins bilaterally. Extraocular muscles are intact and visual fields are full to confrontational testing. Speech is fluent and clear. Soft palate rises symmetrically and there is no tongue deviation. Hearing is intact to conversational tone. Tone: Tone is good throughout. Sensation: Sensation is intact to light touch and pinprick throughout (facial, trunk, extremities). Vibration is intact at the bilateral big toe. There is no extinction with double simultaneous stimulation. There is no sensory dermatomal level identified. Coordination:  The patient has no dysdiadichokinesia or dysmetria. Motor: Strength is 5/5 in the bilateral upper and lower extremities.  Shoulder shrug is equal bilaterally.  There is no pronator drift.  There are no fasciculations noted. DTR's: Deep tendon reflexes are 2/4 at the bilateral biceps, triceps, brachioradialis, patella and achilles.  Plantar responses are downgoing bilaterally. Gait and Station: The patient is able to ambulate without difficulty. The patient is able to heel toe walk without any difficulty. The patient is able to  ambulate in a tandem fashion. The patient is able to stand in the Romberg position.   MOVEMENT EXAM: Tremor:  There is no rest tremor.  Very minimal tremor of the outstretched hands.  No increase with intention.  No trouble with Archimedes spirals without significant difficulty.    The patient is able to pour water from one glass to another without spilling it.  She writes a sentence with no evidence of tremor.       Assessment/Plan:   1.  Tremor.  -I saw very minimal, if any, tremor with examination today. 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  There was no evidence  of any type of neurodegenerative condition.  I saw no essential tremor.   Reassurance provided 2.  Paresthesias, diffuse.  -Her neurological examination was nonfocal and nonlateralizing today.  She has had several labs from her primary care physician.  Her recent TSH was normal.  I will recheck her B12, RPR, SPEP/UPEP with immunofixation.  Her recent glucose was normal.  -I will go ahead and repeat image her brain and cervical spine.  She will call me after completed.  If negative, we are going to consider an EMG, but she does have a normal examinationso yield may be low.

## 2013-04-05 NOTE — Patient Instructions (Addendum)
1. We will check some blood work on you today. Please take your requisition to [facility] on the first floor of our building.  2. We have scheduled [facility] at [facility] for your MRI on 04/15/2013 at 3:00 pm. Please arrive 15 minutes prior and go to 1st floor radiology. 3. We will base your follow up on the results of these tests.

## 2013-04-07 ENCOUNTER — Telehealth: Payer: Self-pay | Admitting: Neurology

## 2013-04-07 LAB — SPEP & IFE WITH QIG
Albumin ELP: 63.7 % (ref 55.8–66.1)
Alpha-1-Globulin: 3.5 % (ref 2.9–4.9)
Alpha-2-Globulin: 8.5 % (ref 7.1–11.8)
Beta 2: 4.9 % (ref 3.2–6.5)
Beta Globulin: 5.6 % (ref 4.7–7.2)
Gamma Globulin: 13.8 % (ref 11.1–18.8)
IgA: 139 mg/dL (ref 69–380)
IgG (Immunoglobin G), Serum: 891 mg/dL (ref 690–1700)
IgM, Serum: 373 mg/dL — ABNORMAL HIGH (ref 52–322)
Total Protein, Serum Electrophoresis: 7 g/dL (ref 6.0–8.3)

## 2013-04-07 LAB — UIFE/LIGHT CHAINS/TP QN, 24-HR UR
Albumin, U: DETECTED
Alpha 1, Urine: DETECTED — AB
Alpha 2, Urine: DETECTED — AB
Beta, Urine: DETECTED — AB
Free Kappa Lt Chains,Ur: 0.55 mg/dL (ref 0.14–2.42)
Free Kappa/Lambda Ratio: 3.44 ratio (ref 2.04–10.37)
Free Lambda Lt Chains,Ur: 0.16 mg/dL (ref 0.02–0.67)
Gamma Globulin, Urine: DETECTED — AB
Total Protein, Urine: 1.8 mg/dL

## 2013-04-07 NOTE — Telephone Encounter (Signed)
Message copied by Silvio PateMCCRACKEN, JADE L on Thu Apr 07, 2013  4:37 PM ------      Message from: TAT, REBECCA S      Created: Thu Apr 07, 2013  4:33 PM       Please let pt know that her B12 is on low end of normal and I would like to see her take B12 - 1000 mcg daily and it can be rechecked in the future (months) ------

## 2013-04-07 NOTE — Telephone Encounter (Signed)
Called patient to make her aware. No answer. Will try again tomorrow.

## 2013-04-08 NOTE — Telephone Encounter (Signed)
Called and spoke to patient. She states she was confused when she spoke with me earlier. She is taking 6000 mg of Vitamin D - not B12. She will start 1000 mcg of B12 now.

## 2013-04-08 NOTE — Telephone Encounter (Signed)
No, do not take per day.  Order methylmalonic acid and homocysteine and then have her get monthly injections.

## 2013-04-08 NOTE — Telephone Encounter (Signed)
Patient had labs drawn at PCP recently as well and was told this information. She was taking vitamin b12 but has bumped this up to 6000 mcg per day. I told her to continue this and we would be back in touch with her with her MR results.

## 2013-04-15 ENCOUNTER — Ambulatory Visit (HOSPITAL_COMMUNITY)
Admission: RE | Admit: 2013-04-15 | Discharge: 2013-04-15 | Disposition: A | Discharge status: Home / Self Care | Payer: 59 | Source: Ambulatory Visit | Attending: Neurology | Admitting: Neurology

## 2013-04-15 DIAGNOSIS — R202 Paresthesia of skin: Secondary | ICD-10-CM

## 2013-04-15 DIAGNOSIS — M47812 Spondylosis without myelopathy or radiculopathy, cervical region: Secondary | ICD-10-CM | POA: Insufficient documentation

## 2013-04-15 DIAGNOSIS — M5030 Other cervical disc degeneration, unspecified cervical region: Secondary | ICD-10-CM | POA: Insufficient documentation

## 2013-04-15 DIAGNOSIS — M542 Cervicalgia: Secondary | ICD-10-CM

## 2013-04-15 DIAGNOSIS — R251 Tremor, unspecified: Secondary | ICD-10-CM

## 2013-04-15 DIAGNOSIS — Z981 Arthrodesis status: Secondary | ICD-10-CM | POA: Insufficient documentation

## 2013-04-18 ENCOUNTER — Telehealth: Payer: Self-pay | Admitting: Neurology

## 2013-04-18 NOTE — Telephone Encounter (Signed)
Message copied by Silvio PateMCCRACKEN, Danyell Awbrey L on Mon Apr 18, 2013  8:25 AM ------      Message from: TAT, REBECCA S      Created: Mon Apr 18, 2013  8:21 AM       Please let pt know that MRI brain looked good.  MRI C- spine:  Moderate disc degeneration and spondylosis at C5-6. This has      progressed since 2013.  Does she want to go back to her surgeon for opinion? ------

## 2013-04-18 NOTE — Telephone Encounter (Signed)
Patient made aware of MR results. She will contact her back surgeon and let us know if she needs anything.

## 2013-11-08 ENCOUNTER — Encounter: Payer: Self-pay | Admitting: Emergency Medicine

## 2013-11-23 ENCOUNTER — Ambulatory Visit (INDEPENDENT_AMBULATORY_CARE_PROVIDER_SITE_OTHER): Payer: 59 | Admitting: Physician Assistant

## 2013-11-23 ENCOUNTER — Encounter: Payer: Self-pay | Admitting: Physician Assistant

## 2013-11-23 VITALS — BP 128/80 | HR 72 | Temp 97.7°F | Resp 16 | Ht 64.5 in | Wt 146.0 lb

## 2013-11-23 DIAGNOSIS — Z0000 Encounter for general adult medical examination without abnormal findings: Secondary | ICD-10-CM

## 2013-11-23 DIAGNOSIS — E538 Deficiency of other specified B group vitamins: Secondary | ICD-10-CM

## 2013-11-23 DIAGNOSIS — E785 Hyperlipidemia, unspecified: Secondary | ICD-10-CM

## 2013-11-23 DIAGNOSIS — R1310 Dysphagia, unspecified: Secondary | ICD-10-CM

## 2013-11-23 LAB — CBC WITH DIFFERENTIAL/PLATELET
Basophils Absolute: 0.1 10*3/uL (ref 0.0–0.1)
Basophils Relative: 1 % (ref 0–1)
Eosinophils Absolute: 0.2 10*3/uL (ref 0.0–0.7)
Eosinophils Relative: 3 % (ref 0–5)
HCT: 36.3 % (ref 36.0–46.0)
Hemoglobin: 12.5 g/dL (ref 12.0–15.0)
Lymphocytes Relative: 33 % (ref 12–46)
Lymphs Abs: 2 10*3/uL (ref 0.7–4.0)
MCH: 29.6 pg (ref 26.0–34.0)
MCHC: 34.4 g/dL (ref 30.0–36.0)
MCV: 85.8 fL (ref 78.0–100.0)
Monocytes Absolute: 0.4 10*3/uL (ref 0.1–1.0)
Monocytes Relative: 7 % (ref 3–12)
Neutro Abs: 3.5 10*3/uL (ref 1.7–7.7)
Neutrophils Relative %: 56 % (ref 43–77)
Platelets: 353 10*3/uL (ref 150–400)
RBC: 4.23 MIL/uL (ref 3.87–5.11)
RDW: 13.7 % (ref 11.5–15.5)
WBC: 6.2 10*3/uL (ref 4.0–10.5)

## 2013-11-23 NOTE — Progress Notes (Signed)
Complete Physical  Assessment and Plan: Arthritis- continue meds, follow up with doctors  Anemia- check CBC, iron, B12  Hyperlipidemia- diet controlled, check labs  Unspecified vitamin D deficiency- with new osteopenia, check level  Headache- remission  Allergy- OTC meds  DDD (degenerative disc disease)- continue meds, follow up  Osteopenia-  continue Vit D and Ca, weight bearing exercises Dysphagia, + epigastric tenderness- get on dexilant samples, diet discussed.  Menopausal- get on bASA, continue MGM, suggest 3D Health Maintenance  Discussed med's effects and SE's. Screening labs and tests as requested with regular follow-up as recommended.  HPI 48 y.o. female  presents for a complete physical.  Her blood pressure has been controlled at home, today their BP is BP: 128/80 mmHg She does not workout due to chronic pain of back/neck.  She denies chest pain, shortness of breath, dizziness.  She is not on cholesterol medication and denies myalgias. Her cholesterol is at goal. The cholesterol last visit was:  LDL 85   Last A1C in the office was: 4.8 Patient is on Vitamin D supplement, she is on 6000 IU daily.   Lab Results  Component Value Date   VD25OH 32 03/28/2013     Is on estrogen patch for osteopenia and menopause from Dr. Jennette Kettle.  She has been having some dysphagia with solid foods for 1 month, some epigastric pain. Concerned it is her thyroid, she states she had an RAI uptake scan years ago that was normal but she did have nodules.   Current Medications:    Medication List       This list is accurate as of: 11/23/13  4:30 PM.  Always use your most recent med list.               CALCIUM PO  Take 500 mg by mouth 2 (two) times daily.     estradiol 0.1 MG/24HR patch  Commonly known as:  VIVELLE-DOT  Place 1 patch onto the skin 2 (two) times a week.     HYDROcodone-acetaminophen 5-325 MG per tablet  Commonly known as:  NORCO/VICODIN  Take 1-2 tablets by mouth every 4  (four) hours as needed.     LUTEIN PO  Take 25 mg by mouth daily.     Magnesium 400 MG Tabs  Take 400 mg by mouth daily.     pregabalin 50 MG capsule  Commonly known as:  LYRICA  Take 50 mg by mouth 2 (two) times daily.     PROBIOTIC PO  Take by mouth daily.     SUPER B COMPLEX PO  Take by mouth daily.     VITAMIN B-12 PO  Take 1,000 mcg by mouth daily.     Vitamin D 2000 UNITS Caps  Take 2,000 Units by mouth 3 (three) times daily.        Health Maintenance:   Immunization History  Administered Date(s) Administered  . Tdap 10/22/2011   Tetanus: 2013 Pneumovax: Flu vaccine: declines Zostavax: Pap: 08/2013 MGM:08/2013 DEXA: 08/2013 Osteopenia Dr. Jennette Kettle Colonoscopy: is scheduled with Dr. Loreta Ave EGD:  Patient Care Team: Lucky Cowboy, MD as PCP - General (Internal Medicine) Manning Charity, OD as Referring Physician (Optometry) Lacretia Nicks Varney Baas, MD as Consulting Physician (Obstetrics and Gynecology) Janalyn Harder, MD as Consulting Physician (Dermatology)   Allergies:  Allergies  Allergen Reactions  . Erythromycin Hives  . Penicillins Hives  . Prednisone Other (See Comments)    pain  . Tetracyclines & Related Hives   Medical History:  Past Medical  History  Diagnosis Date  . PONV (postoperative nausea and vomiting)   . Arthritis     in hands  . Anemia   . Hyperlipidemia     diet controlled  . Unspecified vitamin D deficiency   . Headache   . Allergy   . DDD (degenerative disc disease)    Surgical History:  Past Surgical History  Procedure Laterality Date  . Diagnostic laparoscopy    . Abdominal hysterectomy    . Tonsillectomy    . Cervical disc surgery    . Anterior cervical decomp/discectomy fusion  03/29/2012    Procedure: ANTERIOR CERVICAL DECOMPRESSION/DISCECTOMY FUSION 1 LEVEL;  Surgeon: Temple Pacini, MD;  Location: [facility];  Service: Neurosurgery;  Laterality: Bilateral;  Cervical four-five anterior cervical discectomy with fusion   .  Cesarean section     Family History:  Family History  Problem Relation Age of Onset  . Hypertension Mother   . Hyperlipidemia Mother   . Heart disease Father   . Hyperlipidemia Father   . Cancer Father     prostate  . Hypertension Father   . Diabetes Maternal Aunt   . Cancer Maternal Grandfather     colon   Social History:  History  Substance Use Topics  . Smoking status: Former Smoker -- 3 years    Quit date: 08/17/2011  . Smokeless tobacco: Not on file  . Alcohol Use: No   Review of Systems:  = complains of   = denies  General: Fatigue  Fever  Chills  Weakness   Insomnia Weight change  Night sweats   Change in appetite  Eyes: Redness  Blurred vision  Diplopia  Discharge   ENT: Congestion  Sinus Pain  Post Nasal Drip  Sore Throat  Earache  hearing loss  Tinnitus  Snoring   Cardiac: Chest pain/pressure  SOB  Orthopnea   Palpitations intermittent and no accompaniments [x ]  Paroxysmal nocturnal dyspnea[ ]  Claudication  Edema   Pulmonary: Cough  Wheezing[ ]   SOB   Pleurisy   GI: Nausea  Vomiting[ ]  Dysphagia[ x] Heartburn[x ] Abdominal pain  Epigastric [x ] Constipation ; Diarrhea  BRBPR  Melena[ ]  Bloating  Hemorrhoids   GU: Hematuria[ ]  Dysuria  Nocturia[ ]  Urgency   Hesitancy  Discharge  Frequency   Breast:  Breast lumps   nipple discharge    Neuro: Headaches[ ]  Vertigo[ ]  Paresthesias[ ]  Spasm  Speech changes  Incoordination   Ortho: Arthritis [ x] Joint pain  Muscle pain [x ] Joint swelling  Back Pain [x ] Skin:  Rash   Pruritis  Change in skin lesion   Psych: Depression[ ]  Anxiety[ ]  Confusion  Memory loss   Heme/Lypmh: Bleeding  Bruising  Enlarged lymph nodes   Endocrine: Visual blurring  Paresthesia  Polyuria  Polydypsea    Heat/cold intolerance  Hypoglycemia   Physical Exam: Estimated  body mass index is 24.68 kg/(m^2) as calculated from the following:   Height as of this encounter: 5' 4.5" (1.638 m).   Weight as of this encounter: 146 lb (66.225 kg). BP 128/80  Pulse 72  Temp(Src) 97.7 F (  36.5 C)  Resp 16  Ht 5' 4.5" (1.638 m)  Wt 146 lb (66.225 kg)  BMI 24.68 kg/m2 General Appearance: Well nourished, in no apparent distress. Eyes: PERRLA, EOMs, conjunctiva no swelling or erythema, normal fundi and vessels. Sinuses: No Frontal/maxillary tenderness ENT/Mouth: Ext aud canals clear, normal light reflex with TMs without erythema, bulging.  Good dentition. No erythema, swelling, or exudate on post pharynx. Tonsils not swollen or erythematous. Hearing normal.  Neck: Supple, thyroid with some nodules and possible very slight goiter. No bruits Respiratory: Respiratory effort normal, BS equal bilaterally without rales, rhonchi, wheezing or stridor. Cardio: RRR without murmurs, rubs or gallops. Brisk peripheral pulses without edema.  Chest: symmetric, with normal excursions and percussion. Breasts: defer Abdomen: Soft, +BS. + epigastric tender, no guarding, rebound, hernias, masses, or organomegaly. .  Lymphatics: Non tender without lymphadenopathy.  Genitourinary: defer Musculoskeletal: Full ROM all peripheral extremities,5/5 strength, and normal gait. Skin: Warm, dry without rashes, lesions, ecchymosis.  Neuro: Cranial nerves intact, reflexes equal bilaterally. Normal muscle tone, no cerebellar symptoms. Sensation intact.  Psych: Awake and oriented X 3, normal affect, Insight and Judgment appropriate.   EKG: WNL no changes.   Quentin Mulling 4:15 PM

## 2013-11-23 NOTE — Patient Instructions (Signed)

## 2013-11-24 LAB — HEPATIC FUNCTION PANEL
ALT: <8 U/L (ref 0–35)
AST: 14 U/L (ref 0–37)
Albumin: 4.4 g/dL (ref 3.5–5.2)
Alkaline Phosphatase: 59 U/L (ref 39–117)
Bilirubin, Direct: 0.1 mg/dL (ref 0.0–0.3)
Indirect Bilirubin: 0.3 mg/dL (ref 0.2–1.2)
Total Bilirubin: 0.4 mg/dL (ref 0.2–1.2)
Total Protein: 7.1 g/dL (ref 6.0–8.3)

## 2013-11-24 LAB — BASIC METABOLIC PANEL WITH GFR
BUN: 13 mg/dL (ref 6–23)
CO2: 29 mEq/L (ref 19–32)
Calcium: 9.7 mg/dL (ref 8.4–10.5)
Chloride: 103 mEq/L (ref 96–112)
Creat: 0.63 mg/dL (ref 0.50–1.10)
GFR, Est African American: >89 mL/min
GFR, Est Non African American: >89 mL/min
Glucose, Bld: 81 mg/dL (ref 70–99)
Potassium: 3.9 mEq/L (ref 3.5–5.3)
Sodium: 139 mEq/L (ref 135–145)

## 2013-11-24 LAB — URINALYSIS, MICROSCOPIC ONLY
Bacteria, UA: NONE SEEN
Casts: NONE SEEN
Crystals: NONE SEEN

## 2013-11-24 LAB — IRON AND TIBC
%SAT: 37 % (ref 20–55)
Iron: 102 ug/dL (ref 42–145)
TIBC: 275 ug/dL (ref 250–470)
UIBC: 173 ug/dL (ref 125–400)

## 2013-11-24 LAB — LIPID PANEL
Cholesterol: 164 mg/dL (ref 0–200)
HDL: 53 mg/dL (ref >39)
LDL Cholesterol: 100 mg/dL — ABNORMAL HIGH (ref 0–99)
Total CHOL/HDL Ratio: 3.1 Ratio
Triglycerides: 56 mg/dL (ref <150)
VLDL: 11 mg/dL (ref 0–40)

## 2013-11-24 LAB — VITAMIN B12: Vitamin B-12: 439 pg/mL (ref 211–911)

## 2013-11-24 LAB — TSH: TSH: 0.547 u[IU]/mL (ref 0.350–4.500)

## 2013-11-24 LAB — URINALYSIS, ROUTINE W REFLEX MICROSCOPIC
Glucose, UA: NEGATIVE mg/dL
Hgb urine dipstick: NEGATIVE
Leukocytes, UA: NEGATIVE
Nitrite: NEGATIVE
Protein, ur: NEGATIVE mg/dL
Specific Gravity, Urine: 1.025 (ref 1.005–1.030)
Urobilinogen, UA: 0.2 mg/dL (ref 0.0–1.0)
pH: 5.5 (ref 5.0–8.0)

## 2013-11-24 LAB — MICROALBUMIN / CREATININE URINE RATIO
Creatinine, Urine: 352.1 mg/dL
Microalb Creat Ratio: 2.6 mg/g (ref 0.0–30.0)
Microalb, Ur: 0.93 mg/dL (ref 0.00–1.89)

## 2013-11-24 LAB — FERRITIN: Ferritin: 158 ng/mL (ref 10–291)

## 2013-11-24 LAB — HEMOGLOBIN A1C
Hgb A1c MFr Bld: 5.4 % (ref <5.7)
Mean Plasma Glucose: 108 mg/dL (ref <117)

## 2013-11-24 LAB — INSULIN, FASTING: Insulin fasting, serum: 8.6 u[IU]/mL (ref 2.0–19.6)

## 2013-11-24 LAB — VITAMIN D 25 HYDROXY (VIT D DEFICIENCY, FRACTURES): Vit D, 25-Hydroxy: 49 ng/mL (ref 30–89)

## 2013-11-24 LAB — MAGNESIUM: Magnesium: 2.1 mg/dL (ref 1.5–2.5)

## 2014-03-14 ENCOUNTER — Encounter: Payer: Self-pay | Admitting: Physician Assistant

## 2014-03-14 ENCOUNTER — Ambulatory Visit (INDEPENDENT_AMBULATORY_CARE_PROVIDER_SITE_OTHER): Payer: 59 | Admitting: Physician Assistant

## 2014-03-14 VITALS — BP 100/64 | HR 66 | Temp 98.2°F | Resp 16 | Ht 64.5 in | Wt 148.0 lb

## 2014-03-14 DIAGNOSIS — F329 Major depressive disorder, single episode, unspecified: Secondary | ICD-10-CM

## 2014-03-14 DIAGNOSIS — F32A Depression, unspecified: Secondary | ICD-10-CM

## 2014-03-14 DIAGNOSIS — F411 Generalized anxiety disorder: Secondary | ICD-10-CM

## 2014-03-14 MED ORDER — ALPRAZOLAM 0.25 MG PO TABS: 0.2500 mg | ORAL_TABLET | Freq: Three times a day (TID) | ORAL | Status: DC | PRN

## 2014-03-14 MED ORDER — SERTRALINE HCL 25 MG PO TABS: ORAL_TABLET | ORAL | Status: DC

## 2014-03-14 NOTE — Progress Notes (Signed)
Subjective:    Patient ID: [name] Smith, female    DOB: 05/01/1965, 48 y.o.   MRN: [id number]  Anxiety Presents for initial visit. Episode onset: Started about 2 months ago. The problem has been unchanged. Symptoms include nervous/anxious behavior. Patient reports no shortness of breath. Episode frequency: comes and goes. Duration: Hours. The severity of symptoms is interfering with daily activities. The symptoms are aggravated by social activities. Hours of sleep per night: 7 hours and does take naps during day. The quality of sleep is good. Nighttime awakenings: wake up due to back pain.   Treatments tried: Patient states she was on Lexapro before, but that caused GI upset and would like to try another medication. The treatment provided no relief.  Patient states she thought the anxiety was a side effect of estrogen patch and tried getting off of patch this past summer and did not notice an improvement while not being on patch.  Patient sees Dr. Jordan LikesPool for chronic pain.  Patient is currently taking Lyrica for nerve damage and neck surgery.  Patient is also taking Hydrocodone 5-325mg  and states she just stopped taking them due to running out and not getting refill.  Patient states she has been on them for years and was only taking 1 tablet per day and usually at bed time.  Patient states she was wondering about that medication and iff it can cause anxiety feelings.  Patient does admit to social anxiety when out in a store/crowded area.  She states she get chest tightness, SOB and anxiety when out in a crowd.  Patient does admit to be taking care of her parents and does not currently work.  Patient denies any specific stress or issue that can be causing her symptoms.  GFR= >89 on 11/23/13 Review of Systems  Constitutional: Negative.  Negative for fever, chills, diaphoresis and fatigue.  HENT: Negative.   Eyes: Negative.   Respiratory: Positive for chest tightness. Negative for cough, shortness of  breath and wheezing.   Cardiovascular: Negative.   Gastrointestinal: Negative.   Genitourinary: Negative.   Skin: Negative.  Negative for rash.  Neurological: Negative.   Psychiatric/Behavioral: The patient is nervous/anxious.    Past Medical History  Diagnosis Date  . PONV (postoperative nausea and vomiting)   . Arthritis     in hands  . Anemia   . Hyperlipidemia     diet controlled  . Unspecified vitamin D deficiency   . Headache   . Allergy   . DDD (degenerative disc disease)    Current Outpatient Prescriptions on File Prior to Visit  Medication Sig Dispense Refill  . B Complex-C (SUPER B COMPLEX PO) Take by mouth daily.    Marland Kitchen. CALCIUM PO Take 500 mg by mouth 2 (two) times daily.    . Cholecalciferol (VITAMIN D) 2000 UNITS CAPS Take 2,000 Units by mouth 3 (three) times daily.     . Cyanocobalamin (VITAMIN B-12 PO) Take 1,000 mcg by mouth daily.    Marland Kitchen. estradiol (VIVELLE-DOT) 0.1 MG/24HR patch Place 1 patch onto the skin 2 (two) times a week.    . LUTEIN PO Take 25 mg by mouth daily.    . Magnesium 400 MG TABS Take 400 mg by mouth daily.    . pregabalin (LYRICA) 50 MG capsule Take 50 mg by mouth 2 (two) times daily.    . Probiotic Product (PROBIOTIC PO) Take by mouth daily.     No current facility-administered medications on file prior to visit.  Allergies  Allergen Reactions  . Erythromycin Hives  . Penicillins Hives  . Prednisone Other (See Comments)    pain  . Tetracyclines & Related Hives     BP 100/64 mmHg  Pulse 66  Temp(Src) 98.2 F (36.8 C) (Temporal)  Resp 16  Ht 5' 4.5" (1.638 m)  Wt 148 lb (67.132 kg)  BMI 25.02 kg/m2  SpO2 98% Wt Readings from Last 3 Encounters:  03/14/14 148 lb (67.132 kg)  11/23/13 146 lb (66.225 kg)  04/05/13 139 lb (63.05 kg)   Objective:   Physical Exam  Constitutional: She is oriented to person, place, and time. She appears well-developed and well-nourished. She does not have a sickly appearance. No distress.  HENT:  Head:  Normocephalic.  Right Ear: Tympanic membrane, external ear and ear canal normal.  Left Ear: Tympanic membrane, external ear and ear canal normal.  Nose: Nose normal. Right sinus exhibits no maxillary sinus tenderness and no frontal sinus tenderness. Left sinus exhibits no maxillary sinus tenderness and no frontal sinus tenderness.  Mouth/Throat: Uvula is midline, oropharynx is clear and moist and mucous membranes are normal. Mucous membranes are not pale and not dry. No trismus in the jaw. No uvula swelling. No oropharyngeal exudate, posterior oropharyngeal edema or posterior oropharyngeal erythema.  Eyes: Conjunctivae and lids are normal. Pupils are equal, round, and reactive to light. Right eye exhibits no discharge. Left eye exhibits no discharge. No scleral icterus.  Neck: Trachea normal, normal range of motion and phonation normal. Neck supple. No tracheal tenderness present. No tracheal deviation present. No thyroid mass and no thyromegaly present.  Cardiovascular: Normal rate, regular rhythm, S1 normal, S2 normal, normal heart sounds, intact distal pulses and normal pulses.  Exam reveals no gallop, no distant heart sounds and no friction rub.   No murmur heard. Pulmonary/Chest: Effort normal and breath sounds normal. No stridor. No respiratory distress. She has no decreased breath sounds. She has no wheezes. She has no rhonchi. She has no rales. She exhibits no tenderness.  Abdominal: Soft. Bowel sounds are normal. There is no tenderness. There is no rebound and no guarding.  Lymphadenopathy:  No tenderness or LAD.  Neurological: She is alert and oriented to person, place, and time. No cranial nerve deficit. Gait normal.  Skin: Skin is warm, dry and intact. No rash noted. She is not diaphoretic. No pallor.  Psychiatric: Her speech is normal and behavior is normal. Judgment and thought content normal. Her mood appears anxious. Cognition and memory are normal.  Vitals reviewed.  Assessment &  Plan:  1. Depression and Generalized Anxiety Disorder- Possible social anxiety? -Start taking Xanax as prescribed- ALPRAZolam (XANAX) 0.25 MG tablet; Take 1 tablet (0.25 mg total) by mouth 3 (three) times daily as needed for anxiety.  Dispense: 90 tablet; Refill: 0 -Start taking Zoloft as prescribed-  sertraline (ZOLOFT) 25 MG tablet; Take 1 tablet PO QDaily for first week.  Then if tolerating, take 2 tablets PO QDaily.  Dispense: 67 tablet; Refill: 0 -Gave patient information about [facility].  Told patient to call to get evaluated by psychiatrist to check for possible social anxiety disorder and to have counseling.  Please talk to Dr. Jordan LikesPool about your pain medications.  Discussed medication effects and SE's.  Pt agreed to treatment plan. Please follow up in 1 month.  Jensine Luz, Lise AuerJennifer L, PA-C 11:46 AM [facility]

## 2014-03-14 NOTE — Patient Instructions (Addendum)
-  Take Xanax as prescribed for anxiety. -Take the Zoloft as prescribed for maintenance of anxiety.  Please follow up in 1 month.  Recommend that you see [facility] to help manage your depression, anxiety and stress better. Address: [address]     [place], [place] [address] [phone]    Generalized Anxiety Disorder Generalized anxiety disorder (GAD) is a mental disorder. It interferes with life functions, including relationships, work, and school. GAD is different from normal anxiety, which everyone experiences at some point in their lives in response to specific life events and activities. Normal anxiety actually helps us prepare for and get through these life events and activities. Normal anxiety goes away after the event or activity is over.  GAD causes anxiety that is not necessarily related to specific events or activities. It also causes excess anxiety in proportion to specific events or activities. The anxiety associated with GAD is also difficult to control. GAD can vary from mild to severe. People with severe GAD can have intense waves of anxiety with physical symptoms (panic attacks).  SYMPTOMS The anxiety and worry associated with GAD are difficult to control. This anxiety and worry are related to many life events and activities and also occur more days than not for 6 months or longer. People with GAD also have three or more of the following symptoms (one or more in children):  Restlessness.   Fatigue.  Difficulty concentrating.   Irritability.  Muscle tension.  Difficulty sleeping or unsatisfying sleep. DIAGNOSIS GAD is diagnosed through an assessment by your health care provider. Your health care provider will ask you questions aboutyour mood,physical symptoms, and events in your life. Your health care provider may ask you about your medical history and use of alcohol or drugs, including prescription medicines. Your health care provider may  also do a physical exam and blood tests. Certain medical conditions and the use of certain substances can cause symptoms similar to those associated with GAD. Your health care provider may refer you to a mental health specialist for further evaluation. TREATMENT The following therapies are usually used to treat GAD:   Medication. Antidepressant medication usually is prescribed for long-term daily control. Antianxiety medicines may be added in severe cases, especially when panic attacks occur.   Talk therapy (psychotherapy). Certain types of talk therapy can be helpful in treating GAD by providing support, education, and guidance. A form of talk therapy called cognitive behavioral therapy can teach you healthy ways to think about and react to daily life events and activities.  Stress managementtechniques. These include yoga, meditation, and exercise and can be very helpful when they are practiced regularly. A mental health specialist can help determine which treatment is best for you. Some people see improvement with one therapy. However, other people require a combination of therapies. Document Released: 06/28/2012 Document Revised: 07/18/2013 Document Reviewed: 06/28/2012 [facility] Patient Information 2015 [place], [place]. This information is not intended to replace advice given to you by your health care provider. Make sure you discuss any questions you have with your health care provider.

## 2014-04-11 ENCOUNTER — Ambulatory Visit: Payer: Self-pay | Admitting: Physician Assistant

## 2014-07-07 ENCOUNTER — Ambulatory Visit
Admission: RE | Admit: 2014-07-07 | Discharge: 2014-07-07 | Disposition: A | Discharge status: Home / Self Care | Payer: 59 | Source: Ambulatory Visit | Attending: Neurosurgery | Admitting: Neurosurgery

## 2014-07-07 ENCOUNTER — Other Ambulatory Visit: Payer: Self-pay | Admitting: Neurosurgery

## 2014-07-07 DIAGNOSIS — M533 Sacrococcygeal disorders, not elsewhere classified: Secondary | ICD-10-CM

## 2014-07-07 MED ORDER — METHYLPREDNISOLONE ACETATE 40 MG/ML INJ SUSP (RADIOLOG
120.0000 mg | Freq: Once | INTRAMUSCULAR | Status: AC
  Administered 2014-07-07: 120 mg via INTRA_ARTICULAR

## 2014-07-07 MED ORDER — IOHEXOL 180 MG/ML  SOLN
1.0000 mL | Freq: Once | INTRAMUSCULAR | Status: AC | PRN
  Administered 2014-07-07: 1 mL via INTRA_ARTICULAR

## 2014-07-29 ENCOUNTER — Encounter: Payer: Self-pay | Admitting: *Deleted

## 2014-11-27 ENCOUNTER — Ambulatory Visit (INDEPENDENT_AMBULATORY_CARE_PROVIDER_SITE_OTHER): Payer: Commercial Managed Care - HMO | Admitting: Physician Assistant

## 2014-11-27 VITALS — BP 100/60 | HR 70 | Temp 97.5°F | Resp 16 | Ht 64.0 in | Wt 152.4 lb

## 2014-11-27 DIAGNOSIS — F411 Generalized anxiety disorder: Secondary | ICD-10-CM

## 2014-11-27 DIAGNOSIS — M5030 Other cervical disc degeneration, unspecified cervical region: Secondary | ICD-10-CM

## 2014-11-27 DIAGNOSIS — F329 Major depressive disorder, single episode, unspecified: Secondary | ICD-10-CM

## 2014-11-27 DIAGNOSIS — I10 Essential (primary) hypertension: Secondary | ICD-10-CM | POA: Diagnosis not present

## 2014-11-27 DIAGNOSIS — Z131 Encounter for screening for diabetes mellitus: Secondary | ICD-10-CM

## 2014-11-27 DIAGNOSIS — Z0000 Encounter for general adult medical examination without abnormal findings: Secondary | ICD-10-CM | POA: Diagnosis not present

## 2014-11-27 DIAGNOSIS — D649 Anemia, unspecified: Secondary | ICD-10-CM

## 2014-11-27 DIAGNOSIS — M8580 Other specified disorders of bone density and structure, unspecified site: Secondary | ICD-10-CM

## 2014-11-27 DIAGNOSIS — M2550 Pain in unspecified joint: Secondary | ICD-10-CM

## 2014-11-27 DIAGNOSIS — M5000 Cervical disc disorder with myelopathy, unspecified cervical region: Secondary | ICD-10-CM

## 2014-11-27 DIAGNOSIS — F32A Depression, unspecified: Secondary | ICD-10-CM

## 2014-11-27 DIAGNOSIS — E785 Hyperlipidemia, unspecified: Secondary | ICD-10-CM

## 2014-11-27 DIAGNOSIS — M26609 Unspecified temporomandibular joint disorder, unspecified side: Secondary | ICD-10-CM

## 2014-11-27 DIAGNOSIS — E559 Vitamin D deficiency, unspecified: Secondary | ICD-10-CM

## 2014-11-27 LAB — CBC WITH DIFFERENTIAL/PLATELET
Basophils Absolute: 0.1 10*3/uL (ref 0.0–0.1)
Basophils Relative: 1 % (ref 0–1)
Eosinophils Absolute: 0.2 10*3/uL (ref 0.0–0.7)
Eosinophils Relative: 3 % (ref 0–5)
HCT: 38.1 % (ref 36.0–46.0)
Hemoglobin: 12.5 g/dL (ref 12.0–15.0)
Lymphocytes Relative: 32 % (ref 12–46)
Lymphs Abs: 1.8 10*3/uL (ref 0.7–4.0)
MCH: 29.3 pg (ref 26.0–34.0)
MCHC: 32.8 g/dL (ref 30.0–36.0)
MCV: 89.2 fL (ref 78.0–100.0)
MPV: 9.7 fL (ref 8.6–12.4)
Monocytes Absolute: 0.3 10*3/uL (ref 0.1–1.0)
Monocytes Relative: 6 % (ref 3–12)
Neutro Abs: 3.2 10*3/uL (ref 1.7–7.7)
Neutrophils Relative %: 58 % (ref 43–77)
Platelets: 325 10*3/uL (ref 150–400)
RBC: 4.27 MIL/uL (ref 3.87–5.11)
RDW: 13 % (ref 11.5–15.5)
WBC: 5.6 10*3/uL (ref 4.0–10.5)

## 2014-11-27 NOTE — Progress Notes (Signed)
Complete Physical  Assessment and Plan: 1. Hyperlipidemia -continue medications, check lipids, decrease fatty foods, increase activity.  - Lipid panel  2. Herniated nucleus pulposus with myelopathy, cervical Follow up Dr. [name]  3. Vitamin D deficiency - Vit D  25 hydroxy (rtn osteoporosis monitoring)  4. Depression Depression- continue medications, stress management techniques discussed, increase water, good sleep hygiene discussed, increase exercise, and increase veggies.  - Magnesium  5. Generalized anxiety disorder  stress management techniques discussed, increase water, good sleep hygiene discussed, increase exercise, and increase veggies.   6. Anemia, unspecified anemia type - Iron and TIBC - Ferritin - Vitamin B12  7. DDD (degenerative disc disease), cervical Continue follow up Dr. [name]  8. Osteopenia Osteopenia- get dexa next year, continue Vit D and Ca, weight bearing exercises  9. Essential hypertension - continue medications, DASH diet, exercise and monitor at home. Call if greater than 130/80. - CBC with Differential/Platelet - BASIC METABOLIC PANEL WITH GFR - Hepatic function panel - TSH - Urinalysis, Routine w reflex microscopic (not at [facility]) - Microalbumin / creatinine urine ratio - EKG 12-Lead - [place], RETROPERITNL ABD,  LTD  10. Screening for diabetes mellitus - Hemoglobin A1c - Insulin, fasting  11. Routine general medical examination at a health care facility Given Dr. Kenna Gilbert number - CBC with Differential/Platelet - BASIC METABOLIC PANEL WITH GFR - Hepatic function panel - TSH - Lipid panel - Hemoglobin A1c - Insulin, fasting - Magnesium - Vit D  25 hydroxy (rtn osteoporosis monitoring) - Urinalysis, Routine w reflex microscopic (not at [facility]) - Microalbumin / creatinine urine ratio - Iron and TIBC - Ferritin - Vitamin B12  12. Polyarthralgia Likely OA, since biateral hands/feet, will get autoimmue labs - Sedimentation rate - ANA -  Anti-DNA antibody, double-stranded - Rheumatoid factor - CK  13. TMJ (temporomandibular joint disorder) information given to the patient, no gum/decrease hard foods, warm wet wash clothes, decrease stress, talk with dentist about possible night guard, can do massage, and exercise.    Discussed med's effects and SE's. Screening labs and tests as requested with regular follow-up as recommended.  HPI 49 y.o. female  presents for a complete physical.  Her blood pressure has been controlled at home, today their BP is BP: 100/60 mmHg She does not workout due to chronic pain of back/neck.  She denies chest pain, shortness of breath, dizziness.  She is not on cholesterol medication and denies myalgias. Her cholesterol is at goal. The cholesterol last visit was:   Lab Results  Component Value Date   CHOL 164 11/23/2013   HDL 53 11/23/2013   LDLCALC 100* 11/23/2013   TRIG 56 11/23/2013   CHOLHDL 3.1 11/23/2013    Last A1C in the office was:  Lab Results  Component Value Date   HGBA1C 5.4 11/23/2013   Patient is on Vitamin D supplement, she is on 6000 IU daily.   Lab Results  Component Value Date   VD25OH 49 11/23/2013     Is on estrogen patch for osteopenia and menopause from Dr. Jennette Kettle, due DEXA 2017.  She was on zoloft for depression/anxiety, but she is off of it due to diarrhea, she is on xanax PRN.  She follows with Dr. [name] for her neck, but has lower back pain, has had injection with him that helped. She states her hands are swelling, heels are painful. She is on lyrica 75 BID. She also has some blurred vision.  She is having headaches daily, right eye and behind it.  Has seen Dr. Eulah Pont in the past.  BMI is Body mass index is 26.15 kg/(m^2)., she is working on diet and exercise. Wt Readings from Last 3 Encounters:  11/27/14 152 lb 6.4 oz (69.128 kg)  03/14/14 148 lb (67.132 kg)  11/23/13 146 lb (66.225 kg)     Current Medications:    Medication List       This list is  accurate as of: 11/27/14  2:30 PM.  Always use your most recent med list.               ALPRAZolam 0.25 MG tablet  Commonly known as:  XANAX  Take 1 tablet (0.25 mg total) by mouth 3 (three) times daily as needed for anxiety.     CALCIUM PO  Take 500 mg by mouth 2 (two) times daily.     estradiol 0.1 MG/24HR patch  Commonly known as:  VIVELLE-DOT  Place 1 patch onto the skin 2 (two) times a week.     LUTEIN PO  Take 25 mg by mouth daily.     pregabalin 50 MG capsule  Commonly known as:  LYRICA  Take 50 mg by mouth 2 (two) times daily.     SUPER B COMPLEX PO  Take by mouth daily.     VITAMIN B-12 PO  Take 1,000 mcg by mouth daily.     Vitamin D 2000 UNITS Caps  Take 2,000 Units by mouth 3 (three) times daily.        Health Maintenance:   Immunization History  Administered Date(s) Administered  . Tdap 10/22/2011   Tetanus: 2013 Pneumovax: Prevnar 13: due age 33 Flu vaccine: declines Zostavax: Pap: 08/2014 MGM:08/2014 DEXA: 08/2013 Osteopenia Dr. Jennette Kettle Colonoscopy: is scheduled with Dr. Loreta Ave, never got EGD: MRI cervical 2015  Eye: Dr. Emily Filbert   Patient Care Team: Lucky Cowboy, MD as PCP - General (Internal Medicine) Manning Charity, OD as Referring Physician (Optometry) Lacretia Nicks Varney Baas, MD as Consulting Physician (Obstetrics and Gynecology) Janalyn Harder, MD as Consulting Physician (Dermatology) Charna Elizabeth, MD as Consulting Physician (Gastroenterology)  Allergies:  Allergies  Allergen Reactions  . Erythromycin Hives  . Penicillins Hives  . Prednisone Other (See Comments)    pain  . Tetracyclines & Related Hives   Medical History:  Past Medical History  Diagnosis Date  . PONV (postoperative nausea and vomiting)   . Arthritis     in hands  . Anemia   . Hyperlipidemia     diet controlled  . Unspecified vitamin D deficiency   . Headache   . Allergy   . DDD (degenerative disc disease)    Surgical History:  Past Surgical History  Procedure  Laterality Date  . Diagnostic laparoscopy    . Abdominal hysterectomy    . Tonsillectomy    . Cervical disc surgery    . Anterior cervical decomp/discectomy fusion  03/29/2012    Procedure: ANTERIOR CERVICAL DECOMPRESSION/DISCECTOMY FUSION 1 LEVEL;  Surgeon: Temple Pacini, MD;  Location: [facility];  Service: Neurosurgery;  Laterality: Bilateral;  Cervical four-five anterior cervical discectomy with fusion   . Cesarean section     Family History:  Family History  Problem Relation Age of Onset  . Hypertension Mother   . Hyperlipidemia Mother   . Heart disease Father   . Hyperlipidemia Father   . Cancer Father     prostate  . Hypertension Father   . Diabetes Maternal Aunt   . Cancer Maternal Grandfather     colon  Social History:  Social History  Substance Use Topics  . Smoking status: Former Smoker -- 3 years    Quit date: 08/17/2011  . Smokeless tobacco: Not on file  . Alcohol Use: No   Review of Systems  Constitutional: Positive for malaise/fatigue. Negative for fever, chills, weight loss and diaphoresis.  HENT: Negative for congestion, ear discharge, ear pain, hearing loss, nosebleeds, sore throat and tinnitus.   Eyes: Positive for blurred vision. Negative for double vision, photophobia, pain, discharge and redness.  Respiratory: Negative.  Negative for stridor.   Cardiovascular: Negative.   Gastrointestinal: Negative.   Genitourinary: Negative.   Musculoskeletal: Positive for back pain. Negative for myalgias, joint pain, falls and neck pain.  Skin: Negative.   Neurological: Positive for headaches. Negative for dizziness, tingling, tremors, sensory change, speech change, focal weakness, seizures, loss of consciousness and weakness.  Psychiatric/Behavioral: Negative.      Physical Exam: Estimated body mass index is 26.15 kg/(m^2) as calculated from the following:   Height as of this encounter:  (1.626 m).   Weight as of this encounter: 152 lb 6.4 oz (69.128  kg). BP 100/60 mmHg  Pulse 70  Temp(Src) 97.5 F (36.4 C)  Resp 16  Ht  (1.626 m)  Wt 152 lb 6.4 oz (69.128 kg)  BMI 26.15 kg/m2 General Appearance: Well nourished, in no apparent distress. Eyes: PERRLA, EOMs, conjunctiva no swelling or erythema, normal fundi and vessels. Sinuses: No Frontal/maxillary tenderness ENT/Mouth: Ext aud canals clear, normal light reflex with TMs without erythema, bulging.  Good dentition. No erythema, swelling, or exudate on post pharynx. Tonsils not swollen or erythematous. Hearing normal.  Neck: Supple, thyroid with some nodules and possible very slight goiter. No bruits Respiratory: Respiratory effort normal, BS equal bilaterally without rales, rhonchi, wheezing or stridor. Cardio: RRR without murmurs, rubs or gallops. Brisk peripheral pulses without edema.  Chest: symmetric, with normal excursions and percussion. Breasts: defer Abdomen: Soft, +BS. + epigastric tender, no guarding, rebound, hernias, masses, or organomegaly. .  Lymphatics: Non tender without lymphadenopathy.  Genitourinary: defer Musculoskeletal: Full ROM all peripheral extremities,5/5 strength, and normal gait. Skin: Warm, dry without rashes, lesions, ecchymosis.  Neuro: Cranial nerves intact, reflexes equal bilaterally. Normal muscle tone, no cerebellar symptoms. Sensation intact.  Psych: Awake and oriented X 3, normal affect, Insight and Judgment appropriate.   EKG: WNL no changes.   Quentin Mulling 2:30 PM

## 2014-11-27 NOTE — Patient Instructions (Addendum)
SCHEDULE COLONOSCOPY DR.[facility] Phone: [phone];  What is the TMJ? The temporomandibular (tem-PUH-ro-man-DIB-yoo-ler) joint, or the TMJ, connects the upper and lower jawbones. This joint allows the jaw to open wide and move back and forth when you chew, talk, or yawn.There are also several muscles that help this joint move. There can be muscle tightness and pain in the muscle that can cause several symptoms.  What causes TMJ pain? There are many causes of TMJ pain. Repeated chewing (for example, chewing gum) and clenching your teeth can cause pain in the joint. Some TMJ pain has no obvious cause. What can I do to ease the pain? There are many things you can do to help your pain get better. When you have pain:  Eat soft foods and stay away from chewy foods (for example, taffy) Try to use both sides of your mouth to chew Don't chew gum Massage Don't open your mouth wide (for example, during yawning or singing) Don't bite your cheeks or fingernails Lower your amount of stress and worry Applying a warm, damp washcloth to the joint may help. Over-the-counter pain medicines such as ibuprofen (one brand: Advil) or acetaminophen (one brand: Tylenol) might also help. Do not use these medicines if you are allergic to them or if your doctor told you not to use them. How can I stop the pain from coming back? When your pain is better, you can do these exercises to make your muscles stronger and to keep the pain from coming back:  Resisted mouth opening: Place your thumb or two fingers under your chin and open your mouth slowly, pushing up lightly on your chin with your thumb. Hold for three to six seconds. Close your mouth slowly. Resisted mouth closing: Place your thumbs under your chin and your two index fingers on the ridge between your mouth and the bottom of your chin. Push down lightly on your chin as you close your mouth. Tongue up: Slowly open and close your mouth while keeping the tongue touching  the roof of the mouth. Side-to-side jaw movement: Place an object about one fourth of an inch thick (for example, two tongue depressors) between your front teeth. Slowly move your jaw from side to side. Increase the thickness of the object as the exercise becomes easier Forward jaw movement: Place an object about one fourth of an inch thick between your front teeth and move the bottom jaw forward so that the bottom teeth are in front of the top teeth. Increase the thickness of the object as the exercise becomes easier. These exercises should not be painful. If it hurts to do these exercises, stop doing them and talk to your family doctor.

## 2014-11-28 LAB — ANTI-DNA ANTIBODY, DOUBLE-STRANDED: ds DNA Ab: <1 IU/mL

## 2014-11-28 LAB — LIPID PANEL
Cholesterol: 190 mg/dL (ref 125–200)
HDL: 47 mg/dL (ref >=46)
LDL Cholesterol: 126 mg/dL (ref <130)
Total CHOL/HDL Ratio: 4 Ratio (ref <=5.0)
Triglycerides: 86 mg/dL (ref <150)
VLDL: 17 mg/dL (ref <30)

## 2014-11-28 LAB — URINALYSIS, ROUTINE W REFLEX MICROSCOPIC
Bilirubin Urine: NEGATIVE
Glucose, UA: NEGATIVE
Hgb urine dipstick: NEGATIVE
Ketones, ur: NEGATIVE
Leukocytes, UA: NEGATIVE
Nitrite: NEGATIVE
Protein, ur: NEGATIVE
Specific Gravity, Urine: 1.024 (ref 1.001–1.035)
pH: 6 (ref 5.0–8.0)

## 2014-11-28 LAB — HEPATIC FUNCTION PANEL
ALT: 19 U/L (ref 6–29)
AST: 16 U/L (ref 10–35)
Albumin: 4.5 g/dL (ref 3.6–5.1)
Alkaline Phosphatase: 66 U/L (ref 33–130)
Bilirubin, Direct: 0.1 mg/dL (ref <=0.2)
Indirect Bilirubin: 0.3 mg/dL (ref 0.2–1.2)
Total Bilirubin: 0.4 mg/dL (ref 0.2–1.2)
Total Protein: 6.8 g/dL (ref 6.1–8.1)

## 2014-11-28 LAB — VITAMIN D 25 HYDROXY (VIT D DEFICIENCY, FRACTURES): Vit D, 25-Hydroxy: 51 ng/mL (ref 30–100)

## 2014-11-28 LAB — IRON AND TIBC
%SAT: 33 % (ref 11–50)
Iron: 98 ug/dL (ref 45–160)
TIBC: 293 ug/dL (ref 250–450)
UIBC: 195 ug/dL (ref 125–400)

## 2014-11-28 LAB — BASIC METABOLIC PANEL WITH GFR
BUN: 13 mg/dL (ref 7–25)
CO2: 27 mmol/L (ref 20–31)
Calcium: 9.6 mg/dL (ref 8.6–10.4)
Chloride: 102 mmol/L (ref 98–110)
Creat: 0.59 mg/dL (ref 0.50–1.05)
GFR, Est African American: >89 mL/min (ref >=60)
GFR, Est Non African American: >89 mL/min (ref >=60)
Glucose, Bld: 80 mg/dL (ref 65–99)
Potassium: 4.1 mmol/L (ref 3.5–5.3)
Sodium: 142 mmol/L (ref 135–146)

## 2014-11-28 LAB — MICROALBUMIN / CREATININE URINE RATIO
Creatinine, Urine: 248.8 mg/dL
Microalb Creat Ratio: 3.6 mg/g (ref 0.0–30.0)
Microalb, Ur: 0.9 mg/dL (ref <2.0)

## 2014-11-28 LAB — SEDIMENTATION RATE: Sed Rate: 7 mm/hr (ref 0–30)

## 2014-11-28 LAB — HEMOGLOBIN A1C
Hgb A1c MFr Bld: 5.3 % (ref <5.7)
Mean Plasma Glucose: 105 mg/dL (ref <117)

## 2014-11-28 LAB — VITAMIN B12: Vitamin B-12: 302 pg/mL (ref 211–911)

## 2014-11-28 LAB — CK: Total CK: 38 U/L (ref 7–177)

## 2014-11-28 LAB — ANA: Anti Nuclear Antibody(ANA): NEGATIVE

## 2014-11-28 LAB — MAGNESIUM: Magnesium: 2 mg/dL (ref 1.5–2.5)

## 2014-11-28 LAB — FERRITIN: Ferritin: 175 ng/mL (ref 10–291)

## 2014-11-28 LAB — INSULIN, FASTING: Insulin fasting, serum: 7 u[IU]/mL (ref 2.0–19.6)

## 2014-11-28 LAB — RHEUMATOID FACTOR: Rhuematoid fact SerPl-aCnc: <10 IU/mL (ref <=14)

## 2014-11-28 LAB — TSH: TSH: 0.435 u[IU]/mL (ref 0.350–4.500)

## 2015-03-27 ENCOUNTER — Ambulatory Visit (INDEPENDENT_AMBULATORY_CARE_PROVIDER_SITE_OTHER): Payer: Commercial Managed Care - HMO | Admitting: Physician Assistant

## 2015-03-27 ENCOUNTER — Encounter: Payer: Self-pay | Admitting: Physician Assistant

## 2015-03-27 VITALS — BP 102/76 | HR 74 | Temp 98.3°F | Resp 16 | Ht 64.5 in | Wt 150.4 lb

## 2015-03-27 DIAGNOSIS — G43009 Migraine without aura, not intractable, without status migrainosus: Secondary | ICD-10-CM | POA: Diagnosis not present

## 2015-03-27 DIAGNOSIS — D649 Anemia, unspecified: Secondary | ICD-10-CM

## 2015-03-27 DIAGNOSIS — F411 Generalized anxiety disorder: Secondary | ICD-10-CM | POA: Diagnosis not present

## 2015-03-27 DIAGNOSIS — R002 Palpitations: Secondary | ICD-10-CM | POA: Diagnosis not present

## 2015-03-27 DIAGNOSIS — I10 Essential (primary) hypertension: Secondary | ICD-10-CM | POA: Diagnosis not present

## 2015-03-27 DIAGNOSIS — Z79899 Other long term (current) drug therapy: Secondary | ICD-10-CM

## 2015-03-27 LAB — CBC WITH DIFFERENTIAL/PLATELET
Basophils Absolute: 0.1 10*3/uL (ref 0.0–0.1)
Basophils Relative: 1 % (ref 0–1)
Eosinophils Absolute: 0.2 10*3/uL (ref 0.0–0.7)
Eosinophils Relative: 4 % (ref 0–5)
HCT: 38 % (ref 36.0–46.0)
Hemoglobin: 12.8 g/dL (ref 12.0–15.0)
Lymphocytes Relative: 39 % (ref 12–46)
Lymphs Abs: 2 10*3/uL (ref 0.7–4.0)
MCH: 29.4 pg (ref 26.0–34.0)
MCHC: 33.7 g/dL (ref 30.0–36.0)
MCV: 87.4 fL (ref 78.0–100.0)
MPV: 9.6 fL (ref 8.6–12.4)
Monocytes Absolute: 0.4 10*3/uL (ref 0.1–1.0)
Monocytes Relative: 8 % (ref 3–12)
Neutro Abs: 2.4 10*3/uL (ref 1.7–7.7)
Neutrophils Relative %: 48 % (ref 43–77)
Platelets: 306 10*3/uL (ref 150–400)
RBC: 4.35 MIL/uL (ref 3.87–5.11)
RDW: 12.8 % (ref 11.5–15.5)
WBC: 5 10*3/uL (ref 4.0–10.5)

## 2015-03-27 LAB — MAGNESIUM: Magnesium: 2 mg/dL (ref 1.5–2.5)

## 2015-03-27 LAB — BASIC METABOLIC PANEL WITH GFR
BUN: 11 mg/dL (ref 7–25)
CO2: 26 mmol/L (ref 20–31)
Calcium: 9.6 mg/dL (ref 8.6–10.4)
Chloride: 103 mmol/L (ref 98–110)
Creat: 0.61 mg/dL (ref 0.50–1.05)
GFR, Est African American: >89 mL/min (ref >=60)
GFR, Est Non African American: >89 mL/min (ref >=60)
Glucose, Bld: 98 mg/dL (ref 65–99)
Potassium: 4 mmol/L (ref 3.5–5.3)
Sodium: 140 mmol/L (ref 135–146)

## 2015-03-27 LAB — HEPATIC FUNCTION PANEL
ALT: 9 U/L (ref 6–29)
AST: 12 U/L (ref 10–35)
Albumin: 4.4 g/dL (ref 3.6–5.1)
Alkaline Phosphatase: 57 U/L (ref 33–130)
Bilirubin, Direct: <0.1 mg/dL (ref <=0.2)
Total Bilirubin: 0.4 mg/dL (ref 0.2–1.2)
Total Protein: 6.8 g/dL (ref 6.1–8.1)

## 2015-03-27 LAB — FERRITIN: Ferritin: 147 ng/mL (ref 10–291)

## 2015-03-27 LAB — IRON AND TIBC
%SAT: 26 % (ref 11–50)
Iron: 76 ug/dL (ref 45–160)
TIBC: 290 ug/dL (ref 250–450)
UIBC: 214 ug/dL (ref 125–400)

## 2015-03-27 LAB — TSH: TSH: 0.951 u[IU]/mL (ref 0.350–4.500)

## 2015-03-27 MED ORDER — DULOXETINE HCL 30 MG PO CPEP: 30.0000 mg | ORAL_CAPSULE | Freq: Every day | ORAL | Status: DC

## 2015-03-27 NOTE — Progress Notes (Signed)
Subjective:    Patient ID: [name] Smith, female    DOB: 04/21/1965, 50 y.o.   MRN: [id number]  HPI 50 y.o. WF with history of anxiety and migraines presents with migraines consistently for last few weeks, will have HA, diarrhea, and feel anxious, she has also had nonexertional palpitations since Nov, will be worse lying down at night. She is taking care of her mom and dad, and brother was in for the holidays and he has had a stroke. States she took a xanax this AM and she has not had anymore palpitations. Migraines will last 1 days, several times a week, will have nausea without vomiting with diarrhea, no aura, no changes in vision, speech. Takes Excedrin migraine that will occ help.  Has been increasing caffeine due to migraines and palpitations have been better with decreasing this, nonexertional with no accompaniments, lasts for hours, nothing better or worse.   Blood pressure 102/76, pulse 74, temperature 98.3 F (36.8 C), resp. rate 16, height 5' 4.5" (1.638 m), weight 150 lb 6.4 oz (68.221 kg).  Past Medical History  Diagnosis Date  . PONV (postoperative nausea and vomiting)   . Arthritis     in hands  . Anemia   . Hyperlipidemia     diet controlled  . Unspecified vitamin D deficiency   . Headache   . Allergy   . DDD (degenerative disc disease)    Current Outpatient Prescriptions on File Prior to Visit  Medication Sig Dispense Refill  . ALPRAZolam (XANAX) 0.25 MG tablet Take 1 tablet (0.25 mg total) by mouth 3 (three) times daily as needed for anxiety. 90 tablet 0  . B Complex-C (SUPER B COMPLEX PO) Take by mouth daily.    Marland Kitchen. CALCIUM PO Take 500 mg by mouth 2 (two) times daily.    . Cholecalciferol (VITAMIN D) 2000 UNITS CAPS Take 2,000 Units by mouth 3 (three) times daily.     . Cyanocobalamin (VITAMIN B-12 PO) Take 1,000 mcg by mouth daily.    Marland Kitchen. estradiol (VIVELLE-DOT) 0.1 MG/24HR patch Place 1 patch onto the skin 2 (two) times a week.    . LUTEIN PO Take 25 mg by mouth  daily.    . pregabalin (LYRICA) 50 MG capsule Take 50 mg by mouth 2 (two) times daily.     No current facility-administered medications on file prior to visit.    Review of Systems  Constitutional: Negative for fever, chills and diaphoresis.  HENT: Negative for congestion, ear discharge, ear pain, hearing loss, nosebleeds, sore throat and tinnitus.   Eyes: Negative for photophobia, pain, discharge and redness.  Respiratory: Negative.  Negative for stridor.   Cardiovascular: Negative.   Gastrointestinal: Negative.   Genitourinary: Negative.   Musculoskeletal: Positive for back pain. Negative for myalgias and neck pain.  Skin: Negative.   Neurological: Positive for headaches. Negative for dizziness, tremors, seizures and weakness.  Psychiatric/Behavioral: Negative.        Objective:   Physical Exam  Constitutional: She is oriented to person, place, and time. She appears well-developed and well-nourished.  HENT:  Head: Normocephalic and atraumatic.  Right Ear: External ear normal.  Left Ear: External ear normal.  Mouth/Throat: Oropharynx is clear and moist.  Eyes: Conjunctivae and EOM are normal. Pupils are equal, round, and reactive to light.  Neck: Normal range of motion. Neck supple. No thyromegaly present.  Cardiovascular: Normal rate, regular rhythm and normal heart sounds.  Exam reveals no gallop and no friction rub.   No  murmur heard. Pulmonary/Chest: Effort normal and breath sounds normal. No respiratory distress. She has no wheezes.  Abdominal: Soft. Bowel sounds are normal. She exhibits no distension and no mass. There is no tenderness. There is no rebound and no guarding.  Musculoskeletal: Normal range of motion.  Lymphadenopathy:    She has no cervical adenopathy.  Neurological: She is alert and oriented to person, place, and time. She displays normal reflexes. No cranial nerve deficit. Coordination normal.  Skin: Skin is warm and dry.  Psychiatric: She has a normal  mood and affect.      Assessment & Plan:  Migraines/palpitations Normal neuro, no red flag symptoms, no accompaniments Will try cymbalta for back pain/migraines/anxiety Can stop lyrica if doing well with cymbalta Can continue xanax PRN Check labs to rule out TSH, anemia, dehydration etc If palpitations continue will get holter, no symptoms at this time If any SOB, CP, changes vision/speech go to ER.  Close follow up 2-4 weeks

## 2015-03-27 NOTE — Patient Instructions (Signed)
Palpitations A palpitation is the feeling that your heartbeat is irregular or is faster than normal. It may feel like your heart is fluttering or skipping a beat. Palpitations are usually not a serious problem. However, in some cases, you may need further medical evaluation. CAUSES  Palpitations can be caused by:  Smoking.  Caffeine or other stimulants, such as diet pills or energy drinks.  Alcohol.  Stress and anxiety.  Strenuous physical activity.  Fatigue.  Certain medicines.  Heart disease, especially if you have a history of irregular heart rhythms (arrhythmias), such as atrial fibrillation, atrial flutter, or supraventricular tachycardia.  An improperly working pacemaker or defibrillator. DIAGNOSIS  To find the cause of your palpitations, your health care provider will take your medical history and perform a physical exam. Your health care provider may also have you take a test called an ambulatory electrocardiogram (ECG). An ECG records your heartbeat patterns over a 24-hour period. You may also have other tests, such as:  Transthoracic echocardiogram (TTE). During echocardiography, sound waves are used to evaluate how blood flows through your heart.  Transesophageal echocardiogram (TEE).  Cardiac monitoring. This allows your health care provider to monitor your heart rate and rhythm in real time.  Holter monitor. This is a portable device that records your heartbeat and can help diagnose heart arrhythmias. It allows your health care provider to track your heart activity for several days, if needed.  Stress tests by exercise or by giving medicine that makes the heart beat faster. TREATMENT  Treatment of palpitations depends on the cause of your symptoms and can vary greatly. Most cases of palpitations do not require any treatment other than time, relaxation, and monitoring your symptoms. Other causes, such as atrial fibrillation, atrial flutter, or supraventricular  tachycardia, usually require further treatment. HOME CARE INSTRUCTIONS   Avoid:  Caffeinated coffee, tea, soft drinks, diet pills, and energy drinks.  Chocolate.  Alcohol.  Stop smoking if you smoke.  Reduce your stress and anxiety. Things that can help you relax include:  A method of controlling things in your body, such as your heartbeats, with your mind (biofeedback).  Yoga.  Meditation.  Physical activity such as swimming, jogging, or walking.  Get plenty of rest and sleep. SEEK MEDICAL CARE IF:   You continue to have a fast or irregular heartbeat beyond 24 hours.  Your palpitations occur more often. SEEK IMMEDIATE MEDICAL CARE IF:  You have chest pain or shortness of breath.  You have a severe headache.  You feel dizzy or you faint. MAKE SURE YOU:  Understand these instructions.  Will watch your condition.  Will get help right away if you are not doing well or get worse.   This information is not intended to replace advice given to you by your health care provider. Make sure you discuss any questions you have with your health care provider.   Document Released: 02/29/2000 Document Revised: 03/08/2013 Document Reviewed: 05/02/2011 Elsevier Interactive Patient Education 2016 [employer]  Migraine Headache A migraine headache is an intense, throbbing pain on one or both sides of your head. A migraine can last for 30 minutes to several hours. CAUSES  The exact cause of a migraine headache is not always known. However, a migraine may be caused when nerves in the brain become irritated and release chemicals that cause inflammation. This causes pain. Certain things may also trigger migraines, such as:  Alcohol.  Smoking.  Stress.  Menstruation.  Aged cheeses.  Foods or drinks that contain  nitrates, glutamate, aspartame, or tyramine.  Lack of sleep.  Chocolate.  Caffeine.  Hunger.  Physical exertion.  Fatigue.  Medicines used to treat  chest pain (nitroglycerine), birth control pills, estrogen, and some blood pressure medicines. SIGNS AND SYMPTOMS  Pain on one or both sides of your head.  Pulsating or throbbing pain.  Severe pain that prevents daily activities.  Pain that is aggravated by any physical activity.  Nausea, vomiting, or both.  Dizziness.  Pain with exposure to bright lights, loud noises, or activity.  General sensitivity to bright lights, loud noises, or smells. Before you get a migraine, you may get warning signs that a migraine is coming (aura). An aura may include:  Seeing flashing lights.  Seeing bright spots, halos, or zigzag lines.  Having tunnel vision or blurred vision.  Having feelings of numbness or tingling.  Having trouble talking.  Having muscle weakness. DIAGNOSIS  A migraine headache is often diagnosed based on:  Symptoms.  Physical exam.  A CT scan or MRI of your head. These imaging tests cannot diagnose migraines, but they can help rule out other causes of headaches. TREATMENT Medicines may be given for pain and nausea. Medicines can also be given to help prevent recurrent migraines.  HOME CARE INSTRUCTIONS  Only take over-the-counter or prescription medicines for pain or discomfort as directed by your health care provider. The use of long-term narcotics is not recommended.  Lie down in a dark, quiet room when you have a migraine.  Keep a journal to find out what may trigger your migraine headaches. For example, write down:  What you eat and drink.  How much sleep you get.  Any change to your diet or medicines.  Limit alcohol consumption.  Quit smoking if you smoke.  Get 7-9 hours of sleep, or as recommended by your health care provider.  Limit stress.  Keep lights dim if bright lights bother you and make your migraines worse. SEEK IMMEDIATE MEDICAL CARE IF:   Your migraine becomes severe.  You have a fever.  You have a stiff neck.  You have  vision loss.  You have muscular weakness or loss of muscle control.  You start losing your balance or have trouble walking.  You feel faint or pass out.  You have severe symptoms that are different from your first symptoms. MAKE SURE YOU:   Understand these instructions.  Will watch your condition.  Will get help right away if you are not doing well or get worse.   This information is not intended to replace advice given to you by your health care provider. Make sure you discuss any questions you have with your health care provider.   Document Released: 03/03/2005 Document Revised: 03/24/2014 Document Reviewed: 11/08/2012 Elsevier Interactive Patient Education [employer]

## 2015-04-25 ENCOUNTER — Encounter: Payer: Self-pay | Admitting: Physician Assistant

## 2015-04-25 ENCOUNTER — Ambulatory Visit (INDEPENDENT_AMBULATORY_CARE_PROVIDER_SITE_OTHER): Payer: Commercial Managed Care - HMO | Admitting: Physician Assistant

## 2015-04-25 VITALS — BP 124/66 | HR 88 | Temp 97.7°F | Resp 16 | Ht 64.5 in | Wt 150.4 lb

## 2015-04-25 DIAGNOSIS — G4730 Sleep apnea, unspecified: Secondary | ICD-10-CM

## 2015-04-25 NOTE — Progress Notes (Signed)
Assessment and Plan: Migraine- continue lyrica, ? From OSA Back pain- continue follow up ortho Sleep apnea- + fatigue, witness apnea, migraines, get sleep study  Future Appointments Date Time Provider Department Center  11/28/2015 2:00 PM Quentin Mulling, PA-C [id number] None    HPI 50 y.o.female presents for 1 month follow up.  She was given Cymbalta but states that she had diarrhea and was unable to tolerate it, she continues to be on lyrica and will take xanax BID PRN.  Her husband states that she will have witness apnea spells at night, has some decrease sleep at night,  She has stopped caffeine and states the palpitations are nearly gone.   Past Medical History  Diagnosis Date  . PONV (postoperative nausea and vomiting)   . Arthritis     in hands  . Anemia   . Hyperlipidemia     diet controlled  . Unspecified vitamin D deficiency   . Headache   . Allergy   . DDD (degenerative disc disease)      Allergies  Allergen Reactions  . Erythromycin Hives  . Penicillins Hives  . Prednisone Other (See Comments)    pain  . Tetracyclines & Related Hives      Current Outpatient Prescriptions on File Prior to Visit  Medication Sig Dispense Refill  . ALPRAZolam (XANAX) 0.25 MG tablet Take 1 tablet (0.25 mg total) by mouth 3 (three) times daily as needed for anxiety. 90 tablet 0  . B Complex-C (SUPER B COMPLEX PO) Take by mouth daily.    Marland Kitchen CALCIUM PO Take 500 mg by mouth 2 (two) times daily.    . Cholecalciferol (VITAMIN D) 2000 UNITS CAPS Take 2,000 Units by mouth 3 (three) times daily.     . Cyanocobalamin (VITAMIN B-12 PO) Take 1,000 mcg by mouth daily.    Marland Kitchen estradiol (VIVELLE-DOT) 0.1 MG/24HR patch Place 1 patch onto the skin 2 (two) times a week.    . LUTEIN PO Take 25 mg by mouth daily.    . pregabalin (LYRICA) 50 MG capsule Take 50 mg by mouth 2 (two) times daily.     No current facility-administered medications on file prior to visit.    ROS: all negative except above.    Physical Exam: Filed Weights   04/25/15 1501  Weight: 150 lb 6.4 oz (68.221 kg)   BP 124/66 mmHg  Pulse 88  Temp(Src) 97.7 F (36.5 C)  Resp 16  Ht 5' 4.5" (1.638 m)  Wt 150 lb 6.4 oz (68.221 kg)  BMI 25.43 kg/m2  SpO2 99% General Appearance: Well nourished, in no apparent distress. Eyes: PERRLA, EOMs, conjunctiva no swelling or erythema Sinuses: No Frontal/maxillary tenderness ENT/Mouth: Ext aud canals clear, TMs without erythema, bulging. No erythema, swelling, or exudate on post pharynx.  Tonsils not swollen or erythematous. Hearing normal.  Neck: Supple, thyroid normal.  Respiratory: Respiratory effort normal, BS equal bilaterally without rales, rhonchi, wheezing or stridor.  Cardio: RRR with no MRGs. Brisk peripheral pulses without edema.  Abdomen: Soft, + BS.  Non tender, no guarding, rebound, hernias, masses. Lymphatics: Non tender without lymphadenopathy.  Musculoskeletal: Full ROM, 5/5 strength, normal gait.  Skin: Warm, dry without rashes, lesions, ecchymosis.  Neuro: Cranial nerves intact. Normal muscle tone, no cerebellar symptoms. Sensation intact.  Psych: Awake and oriented X 3, normal affect, Insight and Judgment appropriate.     Quentin Mulling, PA-C 3:14 PM [facility]

## 2015-04-25 NOTE — Patient Instructions (Signed)
I think it is possible that you have sleep apnea. It can cause interrupted sleep, headaches, frequent awakenings, fatigue, dry mouth, fast/slow heart beats, memory issues, anxiety/depression, swelling, numbness tingling hands/feet, weight gain, shortness of breath, and the list goes on. Sleep apnea needs to be ruled out because if it is left untreated it does eventually lead to abnormal heart beats, lung failure or heart failure as well as increasing the risk of heart attack and stroke. There are masks you can wear OR a mouth piece that I can give you information about. Often times though people feel MUCH better after getting treatment.   Sleep Apnea  Sleep apnea is a sleep disorder characterized by abnormal pauses in breathing while you sleep. When your breathing pauses, the level of oxygen in your blood decreases. This causes you to move out of deep sleep and into light sleep. As a result, your quality of sleep is poor, and the system that carries your blood throughout your body (cardiovascular system) experiences stress. If sleep apnea remains untreated, the following conditions can develop:  High blood pressure (hypertension).  Coronary artery disease.  Inability to achieve or maintain an erection (impotence).  Impairment of your thought process (cognitive dysfunction). There are three types of sleep apnea: 1. Obstructive sleep apnea--Pauses in breathing during sleep because of a blocked airway. 2. Central sleep apnea--Pauses in breathing during sleep because the area of the brain that controls your breathing does not send the correct signals to the muscles that control breathing. 3. Mixed sleep apnea--A combination of both obstructive and central sleep apnea.  RISK FACTORS The following risk factors can increase your risk of developing sleep apnea:  Being overweight.  Smoking.  Having narrow passages in your nose and throat.  Being of older age.  Being female.  Alcohol use.   Sedative and tranquilizer use.  Ethnicity. Among individuals younger than 35 years, African Americans are at increased risk of sleep apnea. SYMPTOMS   Difficulty staying asleep.  Daytime sleepiness and fatigue.  Loss of energy.  Irritability.  Loud, heavy snoring.  Morning headaches.  Trouble concentrating.  Forgetfulness.  Decreased interest in sex. DIAGNOSIS  In order to diagnose sleep apnea, your caregiver will perform a physical examination. Your caregiver may suggest that you take a home sleep test. Your caregiver may also recommend that you spend the night in a sleep lab. In the sleep lab, several monitors record information about your heart, lungs, and brain while you sleep. Your leg and arm movements and blood oxygen level are also recorded. TREATMENT The following actions may help to resolve mild sleep apnea:  Sleeping on your side.   Using a decongestant if you have nasal congestion.   Avoiding the use of depressants, including alcohol, sedatives, and narcotics.   Losing weight and modifying your diet if you are overweight. There also are devices and treatments to help open your airway:  Oral appliances. These are custom-made mouthpieces that shift your lower jaw forward and slightly open your bite. This opens your airway.  Devices that create positive airway pressure. This positive pressure "splints" your airway open to help you breathe better during sleep. The following devices create positive airway pressure:  Continuous positive airway pressure (CPAP) device. The CPAP device creates a continuous level of air pressure with an air pump. The air is delivered to your airway through a mask while you sleep. This continuous pressure keeps your airway open.  Nasal expiratory positive airway pressure (EPAP) device. The EPAP device   creates positive air pressure as you exhale. The device consists of single-use valves, which are inserted into each nostril and held in  place by adhesive. The valves create very little resistance when you inhale but create much more resistance when you exhale. That increased resistance creates the positive airway pressure. This positive pressure while you exhale keeps your airway open, making it easier to breath when you inhale again.  Bilevel positive airway pressure (BPAP) device. The BPAP device is used mainly in patients with central sleep apnea. This device is similar to the CPAP device because it also uses an air pump to deliver continuous air pressure through a mask. However, with the BPAP machine, the pressure is set at two different levels. The pressure when you exhale is lower than the pressure when you inhale.  Surgery. Typically, surgery is only done if you cannot comply with less invasive treatments or if the less invasive treatments do not improve your condition. Surgery involves removing excess tissue in your airway to create a wider passage way. Document Released: 02/21/2002 Document Revised: 06/28/2012 Document Reviewed: 07/10/2011 [employer] Patient Information 2015 [employer]. This information is not intended to replace advice given to you by your health care provider. Make sure you discuss any questions you have with your health care provider.     

## 2015-05-14 ENCOUNTER — Institutional Professional Consult (permissible substitution): Payer: Self-pay | Admitting: Neurology

## 2015-05-15 ENCOUNTER — Encounter: Payer: Self-pay | Admitting: Neurology

## 2015-05-15 ENCOUNTER — Other Ambulatory Visit: Payer: Self-pay | Admitting: Internal Medicine

## 2015-05-15 ENCOUNTER — Ambulatory Visit (INDEPENDENT_AMBULATORY_CARE_PROVIDER_SITE_OTHER): Payer: 59 | Admitting: Neurology

## 2015-05-15 VITALS — BP 110/62 | HR 84 | Resp 20 | Ht 65.0 in | Wt 151.0 lb

## 2015-05-15 DIAGNOSIS — R0683 Snoring: Secondary | ICD-10-CM | POA: Diagnosis not present

## 2015-05-15 DIAGNOSIS — M4802 Spinal stenosis, cervical region: Secondary | ICD-10-CM | POA: Diagnosis not present

## 2015-05-15 DIAGNOSIS — G4719 Other hypersomnia: Secondary | ICD-10-CM | POA: Diagnosis not present

## 2015-05-15 DIAGNOSIS — G4701 Insomnia due to medical condition: Secondary | ICD-10-CM | POA: Insufficient documentation

## 2015-05-15 DIAGNOSIS — G8929 Other chronic pain: Secondary | ICD-10-CM | POA: Diagnosis not present

## 2015-05-15 NOTE — Patient Instructions (Signed)

## 2015-05-15 NOTE — Progress Notes (Signed)
SLEEP MEDICINE CLINIC   Provider:  Melvyn Novas, M D  Referring Provider: Lucky Cowboy, MD Primary Care Physician:  [name] Corwin, MD  Chief Complaint  Patient presents with  . New Patient (Initial Visit)    possible sleep apnea, snores, never had sleep study, rm 11, alone    HPI:  [name] Smith is a 50 y.o. female , seen here as a referral from Dr. Oneta Smith for evaluation of sleep apnea.   [name] Smith reports having woken with the feeling of her tongue closing her throat. 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 She chews her tongue at night, and  woke several times biting her bottom lip. Her husband has noted her to snore and stop to breath. She has trouble going to sleep and uses regularly a pain medication at night. It is back and neck pain that interrupts her sleep or doesn't allow her to go to sleep in the first place. She reports that she likes to sleep on her side not in supine that she uses a very slim pillow. She shares a bedroom with her husband and describes her bedroom is quiet, cool and dark. [name] Smith think that she may snore for several years now but the apnea components was just recently noted by her husband and may be related to her pain medication as well. She just purchased another med service on Friday in hopes that this will help her to reduce her back and neck pain. She feels fatigued, she also has frequent migraines which were partially treated with Lyrica. She also takes Lyrica to control tremor.  Sleep habits are as follows: The patient states that she usually goes to bed around 1 AM she has been a success to initiate sleep if she took hydrocodone. Without a pain pill she sometimes unable to initiate sleep until the early morning hours. Once asleep she usually stays asleep until she is woken by her alarm clock at 7-9 Am. She usually has neither bathroom breaks nor any other interruptions in her sleep. This picture is different if she  wouldn't take her pain medication her sleep would be fragmented and she would be more restless. With the medication however she is able to sleep through the night and gets at least 6 hours of sleep, with a new mattress she feels that she is also not as restless. When she rises in the morning she does not wake with headaches and has no dry mouth. She avoids caffeinated beverages and often has her breakfast delayed in the later morning hours. She  sleeps a couple of hours after she has send her children to school and has taking care of the pets. 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 She will not take any naps later in the day. She has no history of shift work. She has never been a heavy user of tobacco or alcohol.   Sleep medical history and family sleep history:  Father is a loud snorer. No [ersonal history of childhood night terrors or sleep walking. Social history:   She has 3 children , young adults. No tobacco,  ETOH use.   Review of Systems: Out of a complete 14 system review, the patient complains of only the following symptoms, and all other reviewed systems are negative.   How likely are you to doze in the following situations: 0 = not likely, 1 = slight chance, 2 = moderate chance, 3 = high chance  Sitting and Reading?1 Watching Television?1  Sitting inactive in a public place (theater or meeting)?0 As a passenger in a car for an hour without a break?2  Lying down in the afternoon when circumstances permit?3 Sitting and talking to someone?0 Sitting quietly after lunch without alcohol?1 In a car, while stopped for a few minutes in traffic?0  Total = 8  , Fatigue severity score 20  , depression score n/a   Social History   Social History  . Marital Status: Married    Spouse Name: N/A  . Number of Children: N/A  . Years of Education: N/A   Occupational History  . Not on file.   Social History Main Topics  . Smoking status: Former Smoker -- 3 years    Quit date:  08/17/2011  . Smokeless tobacco: Not on file  . Alcohol Use: No  . Drug Use: No  . Sexual Activity: Yes    Birth Control/ Protection: Surgical     Comment: 1 cig every 2-3 dyas   Other Topics Concern  . Not on file   Social History Narrative    Family History  Problem Relation Age of Onset  . Hypertension Mother   . Hyperlipidemia Mother   . Heart disease Father   . Hyperlipidemia Father   . Cancer Father     prostate  . Hypertension Father   . Diabetes Maternal Aunt   . Cancer Maternal Grandfather     colon    Past Medical History  Diagnosis Date  . PONV (postoperative nausea and vomiting)   . Arthritis     in hands  . Anemia   . Hyperlipidemia     diet controlled  . Unspecified vitamin D deficiency   . Headache   . Allergy   . DDD (degenerative disc disease)     Past Surgical History  Procedure Laterality Date  . Diagnostic laparoscopy    . Abdominal hysterectomy    . Tonsillectomy    . Cervical disc surgery    . Anterior cervical decomp/discectomy fusion  03/29/2012    Procedure: ANTERIOR CERVICAL DECOMPRESSION/DISCECTOMY FUSION 1 LEVEL;  Surgeon: Temple Pacini, MD;  Location: [facility];  Service: Neurosurgery;  Laterality: Bilateral;  Cervical four-five anterior cervical discectomy with fusion   . Cesarean section      Current Outpatient Prescriptions  Medication Sig Dispense Refill  . ALPRAZolam (XANAX) 0.25 MG tablet Take 1 tablet (0.25 mg total) by mouth 3 (three) times daily as needed for anxiety. 90 tablet 0  . B Complex-C (SUPER B COMPLEX PO) Take by mouth daily.    Marland Kitchen CALCIUM PO Take 500 mg by mouth 2 (two) times daily.    . Cholecalciferol (VITAMIN D) 2000 UNITS CAPS Take 2,000 Units by mouth 3 (three) times daily.     . Cyanocobalamin (VITAMIN B-12 PO) Take 1,000 mcg by mouth daily.    Marland Kitchen estradiol (VIVELLE-DOT) 0.1 MG/24HR patch Place 1 patch onto the skin 2 (two) times a week.    Marland Kitchen HYDROcodone-acetaminophen (NORCO/VICODIN) 5-325 MG tablet  Take 1 tablet by mouth every 6 (six) hours as needed for moderate pain.    Marland Kitchen LUTEIN PO Take 25 mg by mouth daily.    . pregabalin (LYRICA) 50 MG capsule Take 50 mg by mouth 2 (two) times daily.     No current facility-administered medications for this visit.    Allergies as of 05/15/2015 - Review Complete 05/15/2015  Allergen Reaction Noted  . Erythromycin Hives 03/15/2012  . Penicillins Hives 03/15/2012  .  Prednisone Other (See Comments) 01/26/2013  . Tetracyclines & related Hives 03/15/2012    Vitals: BP 110/62 mmHg  Pulse 84  Resp 20  Ht 5\' 5"  (1.651 m)  Wt 151 lb (68.493 kg)  BMI 25.13 kg/m2 Last Weight:  Wt Readings from Last 1 Encounters:  05/15/15 151 lb (68.493 kg)   ZOX:WRUE mass index is 25.13 kg/(m^2).     Last Height:   Ht Readings from Last 1 Encounters:  05/15/15 5\' 5"  (1.651 m)    Physical exam:  General: The patient is awake, alert and appears not in acute distress. The patient is well groomed. Head: Normocephalic, atraumatic. Neck is supple. Mallampati 3,  neck circumference: 14.5 . Nasal airflow unrestricted  . Retrognathia is seen.  Cardiovascular:  Regular rate and rhythm, without  murmurs or carotid bruit, and without distended neck veins. Respiratory: Lungs are clear to auscultation. Skin:  Without evidence of edema, or rash Trunk: BMI is normal.  Neurologic exam : The patient is awake and alert, oriented to place and time.   Memory subjective  described as intact.  Attention span & concentration ability appears normal.  Speech is fluent,  without dysarthria, dysphonia or aphasia.  Mood and affect are appropriate.  Cranial nerves: Pupils are equal and briskly reactive to light. Funduscopic exam without evidence of pallor or edema.  Extraocular movements  in vertical and horizontal planes intact and without nystagmus. Visual fields by finger perimetry are intact. Hearing to finger rub intact. Facial sensation intact to fine touch.Facial motor  strength is symmetric and tongue and uvula move midline. Shoulder shrug was symmetrical.  Motor exam:   Normal tone, muscle bulk and symmetric strength in all extremities. Sensory:  Fine touch, pinprick and vibration were tested in all extremities. Proprioception tested in the upper extremities was normal. Coordination: Rapid alternating movements in the fingers/hands was normal. Finger-to-nose maneuver  normal without evidence of ataxia, dysmetria or tremor. Gait and station: Patient walks without assistive device and is able unassisted to climb up to the exam table. Strength within normal limits.Stance is stable and normal.   Deep tendon reflexes: in the  upper and lower extremities are symmetric and intact. Babinski maneuver response is downgoing.  The patient was advised of the nature of the diagnosed sleep disorder , the treatment options and risks for general a health and wellness arising from not treating the condition.  I spent more than 40 minutes of face to face time with the patient. Greater than 50% of time was spent in counseling and coordination of care. We have discussed the diagnosis and differential and I answered the patient's questions.     Assessment:  After physical and neurologic examination, review of laboratory studies,  Personal review of imaging studies, reports of other /same  Imaging studies ,  Results of polysomnography/ neurophysiology testing and pre-existing records as far as provided in visit., my assessment is   1) Chronic pain syndrome lower back and neck, hips.  Narcotic pain medication has been used for several years.   This may have an apnea promoting affect, shallow breathing, snoring.   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   3) status post neck surgery times 2.     Plan:  Treatment plan and additional workup :   SPLIT night PSG ,  Capnography for headaches.  Document hypoxemia is present, consider  treating with oxygen if  No significant AHI and/ or no apnea.  Porfirio Mylar Shalita Notte MD  05/15/2015   RV after sleep study.  CC: [name] Cowboy, Md [address] [place], [place] [address]

## 2015-05-28 ENCOUNTER — Other Ambulatory Visit: Payer: Self-pay | Admitting: Physician Assistant

## 2015-05-29 NOTE — Telephone Encounter (Signed)
Rx called into [facility] pharmacy. 

## 2015-06-08 ENCOUNTER — Ambulatory Visit (INDEPENDENT_AMBULATORY_CARE_PROVIDER_SITE_OTHER): Payer: 59 | Admitting: Neurology

## 2015-06-08 DIAGNOSIS — G8929 Other chronic pain: Secondary | ICD-10-CM

## 2015-06-08 DIAGNOSIS — G4701 Insomnia due to medical condition: Secondary | ICD-10-CM

## 2015-06-08 DIAGNOSIS — G4710 Hypersomnia, unspecified: Secondary | ICD-10-CM

## 2015-06-08 DIAGNOSIS — G4719 Other hypersomnia: Secondary | ICD-10-CM

## 2015-06-08 DIAGNOSIS — M4802 Spinal stenosis, cervical region: Secondary | ICD-10-CM

## 2015-06-08 DIAGNOSIS — R0683 Snoring: Secondary | ICD-10-CM

## 2015-06-09 NOTE — Sleep Study (Signed)
Please see the scanned sleep study interpretation located in the procedure tab in the chart view section.  

## 2015-06-14 ENCOUNTER — Telehealth: Payer: Self-pay

## 2015-06-14 NOTE — Telephone Encounter (Signed)
Called pt to discuss sleep study results. I advised pt that her sleep study revealed no organic sleep disorder and does not show insomnia. I recommended a referall to psychology/psychiatry for abnormal sleep perception, versus chronic insomnia. Pt declined a referral. She says her husband initiated all of this and she sleeps fine. She declined a follow up appt but asked that I fax a copy of her sleep study to Dr. Oneta RackMcKeown.

## 2015-09-05 ENCOUNTER — Other Ambulatory Visit: Payer: Self-pay | Admitting: Physician Assistant

## 2015-09-05 NOTE — Telephone Encounter (Signed)
Rx called into [facility].

## 2015-11-20 ENCOUNTER — Other Ambulatory Visit: Payer: Self-pay | Admitting: Physician Assistant

## 2015-11-28 ENCOUNTER — Encounter: Payer: Self-pay | Admitting: Physician Assistant

## 2015-11-28 ENCOUNTER — Ambulatory Visit (HOSPITAL_COMMUNITY)
Admission: RE | Admit: 2015-11-28 | Discharge: 2015-11-28 | Disposition: A | Discharge status: Home / Self Care | Payer: Commercial Managed Care - HMO | Source: Ambulatory Visit | Attending: Physician Assistant | Admitting: Physician Assistant

## 2015-11-28 ENCOUNTER — Ambulatory Visit (INDEPENDENT_AMBULATORY_CARE_PROVIDER_SITE_OTHER): Payer: Commercial Managed Care - HMO | Admitting: Physician Assistant

## 2015-11-28 VITALS — BP 118/66 | HR 86 | Temp 97.7°F | Resp 16 | Ht 64.5 in | Wt 148.2 lb

## 2015-11-28 DIAGNOSIS — F411 Generalized anxiety disorder: Secondary | ICD-10-CM | POA: Diagnosis not present

## 2015-11-28 DIAGNOSIS — R791 Abnormal coagulation profile: Secondary | ICD-10-CM

## 2015-11-28 DIAGNOSIS — F32A Depression, unspecified: Secondary | ICD-10-CM

## 2015-11-28 DIAGNOSIS — E785 Hyperlipidemia, unspecified: Secondary | ICD-10-CM

## 2015-11-28 DIAGNOSIS — M8580 Other specified disorders of bone density and structure, unspecified site: Secondary | ICD-10-CM | POA: Diagnosis not present

## 2015-11-28 DIAGNOSIS — Z0001 Encounter for general adult medical examination with abnormal findings: Secondary | ICD-10-CM | POA: Diagnosis not present

## 2015-11-28 DIAGNOSIS — D649 Anemia, unspecified: Secondary | ICD-10-CM

## 2015-11-28 DIAGNOSIS — M5000 Cervical disc disorder with myelopathy, unspecified cervical region: Secondary | ICD-10-CM | POA: Diagnosis not present

## 2015-11-28 DIAGNOSIS — G8929 Other chronic pain: Secondary | ICD-10-CM | POA: Diagnosis not present

## 2015-11-28 DIAGNOSIS — E559 Vitamin D deficiency, unspecified: Secondary | ICD-10-CM | POA: Diagnosis not present

## 2015-11-28 DIAGNOSIS — F329 Major depressive disorder, single episode, unspecified: Secondary | ICD-10-CM | POA: Diagnosis not present

## 2015-11-28 DIAGNOSIS — I10 Essential (primary) hypertension: Secondary | ICD-10-CM | POA: Insufficient documentation

## 2015-11-28 DIAGNOSIS — R6889 Other general symptoms and signs: Secondary | ICD-10-CM | POA: Diagnosis not present

## 2015-11-28 DIAGNOSIS — R7989 Other specified abnormal findings of blood chemistry: Secondary | ICD-10-CM

## 2015-11-28 DIAGNOSIS — M5030 Other cervical disc degeneration, unspecified cervical region: Secondary | ICD-10-CM

## 2015-11-28 DIAGNOSIS — R1310 Dysphagia, unspecified: Secondary | ICD-10-CM

## 2015-11-28 DIAGNOSIS — R0602 Shortness of breath: Secondary | ICD-10-CM | POA: Insufficient documentation

## 2015-11-28 DIAGNOSIS — M4802 Spinal stenosis, cervical region: Secondary | ICD-10-CM

## 2015-11-28 DIAGNOSIS — Z136 Encounter for screening for cardiovascular disorders: Secondary | ICD-10-CM | POA: Diagnosis not present

## 2015-11-28 DIAGNOSIS — G4701 Insomnia due to medical condition: Secondary | ICD-10-CM

## 2015-11-28 DIAGNOSIS — R002 Palpitations: Secondary | ICD-10-CM

## 2015-11-28 LAB — CBC WITH DIFFERENTIAL/PLATELET
Basophils Absolute: 55 cells/uL (ref 0–200)
Basophils Relative: 1 %
Eosinophils Absolute: 165 cells/uL (ref 15–500)
Eosinophils Relative: 3 %
HCT: 37.6 % (ref 35.0–45.0)
Hemoglobin: 12.4 g/dL (ref 11.7–15.5)
Lymphocytes Relative: 36 %
Lymphs Abs: 1980 cells/uL (ref 850–3900)
MCH: 28.9 pg (ref 27.0–33.0)
MCHC: 33 g/dL (ref 32.0–36.0)
MCV: 87.6 fL (ref 80.0–100.0)
MPV: 9.8 fL (ref 7.5–12.5)
Monocytes Absolute: 440 cells/uL (ref 200–950)
Monocytes Relative: 8 %
Neutro Abs: 2860 cells/uL (ref 1500–7800)
Neutrophils Relative %: 52 %
Platelets: 322 10*3/uL (ref 140–400)
RBC: 4.29 MIL/uL (ref 3.80–5.10)
RDW: 13.8 % (ref 11.0–15.0)
WBC: 5.5 10*3/uL (ref 3.8–10.8)

## 2015-11-28 LAB — LIPID PANEL
Cholesterol: 182 mg/dL (ref 125–200)
HDL: 51 mg/dL (ref >=46)
LDL Cholesterol: 117 mg/dL (ref <130)
Total CHOL/HDL Ratio: 3.6 Ratio (ref <=5.0)
Triglycerides: 69 mg/dL (ref <150)
VLDL: 14 mg/dL (ref <30)

## 2015-11-28 LAB — BASIC METABOLIC PANEL WITH GFR
BUN: 13 mg/dL (ref 7–25)
CO2: 27 mmol/L (ref 20–31)
Calcium: 9.9 mg/dL (ref 8.6–10.4)
Chloride: 105 mmol/L (ref 98–110)
Creat: 0.69 mg/dL (ref 0.50–1.05)
GFR, Est African American: >89 mL/min (ref >=60)
GFR, Est Non African American: >89 mL/min (ref >=60)
Glucose, Bld: 85 mg/dL (ref 65–99)
Potassium: 4 mmol/L (ref 3.5–5.3)
Sodium: 139 mmol/L (ref 135–146)

## 2015-11-28 LAB — IRON AND TIBC
%SAT: 39 % (ref 11–50)
Iron: 110 ug/dL (ref 45–160)
TIBC: 281 ug/dL (ref 250–450)
UIBC: 171 ug/dL (ref 125–400)

## 2015-11-28 LAB — HEPATIC FUNCTION PANEL
ALT: 6 U/L (ref 6–29)
AST: 13 U/L (ref 10–35)
Albumin: 4.5 g/dL (ref 3.6–5.1)
Alkaline Phosphatase: 68 U/L (ref 33–130)
Bilirubin, Direct: 0.1 mg/dL (ref <=0.2)
Indirect Bilirubin: 0.3 mg/dL (ref 0.2–1.2)
Total Bilirubin: 0.4 mg/dL (ref 0.2–1.2)
Total Protein: 7.3 g/dL (ref 6.1–8.1)

## 2015-11-28 LAB — FERRITIN: Ferritin: 158 ng/mL (ref 10–232)

## 2015-11-28 LAB — TSH: TSH: 0.61 mIU/L

## 2015-11-28 LAB — VITAMIN B12: Vitamin B-12: 387 pg/mL (ref 200–1100)

## 2015-11-28 LAB — MAGNESIUM: Magnesium: 2 mg/dL (ref 1.5–2.5)

## 2015-11-28 NOTE — Patient Instructions (Addendum)
Cologuard is an easy to use noninvasive colon cancer screening test based on the latest advances in stool DNA science.   Colon cancer is 3rd most diagnosed cancer and 2nd leading cause of death in both men and women 52 years of age and older despite being one of the most preventable and treatable cancers if found early.  4 of out 5 people diagnosed with colon cancer have NO prior family history.  When caught EARLY 90% of colon cancer is curable.   You have agreed to do a Cologuard screening and have declined a colonoscopy in spite of being explained the risks and benefits of the colonoscopy in detail, including cancer and death. Please understand that this is test not as sensitive or specific as a colonoscopy and you are still recommended to get a colonoscopy.   If you are NOT medicare please call your insurance company and given them this CPT code, [id number], in order to see how much your insurance company will cover Or you can call [phone] to talk with Cologuard about pricing and coverage.  Out-of-pocket cost for Cologuard can range from $0 - $649 so please call  You will receive a short call from [place] support center at [employer], when you receive a call they will say they are from [place],  to confirm your mailing address and give you more information.  When they calll you, it will appear on the caller ID as "[employer]" or in some cases only this number will appear, [phone].   [employer] will ship your collection kit directly to you. You will collect a single stool sample in the privacy of your own home, no special preparation required. You will return the kit via [employer] pre-paid shipping or pick-up, in the same box it arrived in. Then I will contact you to discuss your results after I receive them from the laboratory.   If you have any questions or concerns, [employer] Customer Support Specialist are available 24 hours a day, 7 days  a week at [phone] or go to [email or web address].      Get on PPI 2 x a day for 7 days, then once a day for 3 weeks.  If worsening symptoms go to Er or call the office  Common causes of cough OR hoarseness OR sore throat:   Allergies, Viral Infections, Acid Reflux and Bacterial Infections.  1) Allergies and viral infections cause a cough OR sore throat by post nasal drip and are often worse at night, can also have sneezing, lower grade fevers, clear/yellow mucus. This is best treated with allergy medications or nasal sprays.  Please get on allegra for 1-2 weeks The strongest is allegra or fexafinadine  Cheapest at [facility], [employer], [facility]  2) Bacterial infections are more severe than allergies or viral infections with fever, teeth pain, fatigue. This can be treated with prednisone and the same over the counter medication and after 7 days can be treated with an antibiotic.    Silent reflux/GERD can cause a cough OR sore throat OR hoarseness OR palpitations OR shortness of breath WITHOUT heart burn because the esophagus that goes to the stomach and trachea that goes to the lungs are very close and when you lay down the acid can irritate your throat and lungs. This can cause hoarseness, cough, shortness of breath, and wheezing. Please stop any alcohol or anti-inflammatories like aleve/advil/ibuprofen and start nexium given   Palpitations A palpitation is the feeling that your heartbeat is irregular  or is faster than normal. It may feel like your heart is fluttering or skipping a beat. Palpitations are usually not a serious problem. However, in some cases, you may need further medical evaluation. CAUSES  Palpitations can be caused by:  Smoking.  Caffeine or other stimulants, such as diet pills or energy drinks.  Alcohol.  Stress and anxiety.  Strenuous physical activity.  Fatigue.  Certain medicines.  Heart disease, especially if you have a history of irregular heart rhythms  (arrhythmias), such as atrial fibrillation, atrial flutter, or supraventricular tachycardia.  An improperly working pacemaker or defibrillator. DIAGNOSIS  To find the cause of your palpitations, your health care provider will take your medical history and perform a physical exam. Your health care provider may also have you take a test called an ambulatory electrocardiogram (ECG). An ECG records your heartbeat patterns over a 24-hour period. You may also have other tests, such as:  Transthoracic echocardiogram (TTE). During echocardiography, sound waves are used to evaluate how blood flows through your heart.  Transesophageal echocardiogram (TEE).  Cardiac monitoring. This allows your health care provider to monitor your heart rate and rhythm in real time.  Holter monitor. This is a portable device that records your heartbeat and can help diagnose heart arrhythmias. It allows your health care provider to track your heart activity for several days, if needed.  Stress tests by exercise or by giving medicine that makes the heart beat faster. TREATMENT  Treatment of palpitations depends on the cause of your symptoms and can vary greatly. Most cases of palpitations do not require any treatment other than time, relaxation, and monitoring your symptoms. Other causes, such as atrial fibrillation, atrial flutter, or supraventricular tachycardia, usually require further treatment. HOME CARE INSTRUCTIONS   Avoid:  Caffeinated coffee, tea, soft drinks, diet pills, and energy drinks.  Chocolate.  Alcohol.  Stop smoking if you smoke.  Reduce your stress and anxiety. Things that can help you relax include:  A method of controlling things in your body, such as your heartbeats, with your mind (biofeedback).  Yoga.  Meditation.  Physical activity such as swimming, jogging, or walking.  Get plenty of rest and sleep. SEEK MEDICAL CARE IF:   You continue to have a fast or irregular heartbeat  beyond 24 hours.  Your palpitations occur more often. SEEK IMMEDIATE MEDICAL CARE IF:  You have chest pain or shortness of breath.  You have a severe headache.  You feel dizzy or you faint. MAKE SURE YOU:  Understand these instructions.  Will watch your condition.  Will get help right away if you are not doing well or get worse.   This information is not intended to replace advice given to you by your health care provider. Make sure you discuss any questions you have with your health care provider.   Document Released: 02/29/2000 Document Revised: 03/08/2013 Document Reviewed: 05/02/2011 Elsevier Interactive Patient Education [employer].

## 2015-11-28 NOTE — Progress Notes (Addendum)
Complete Physical  Assessment and Plan:  Hyperlipidemia -continue medications, check lipids, decrease fatty foods, increase activity.  - Lipid panel   Herniated nucleus pulposus with myelopathy, cervical Follow up Dr. [name]  Vitamin D deficiency - Vit D  25 hydroxy (rtn osteoporosis monitoring)   Depression Depression- continue medications, stress management techniques discussed, increase water, good sleep hygiene discussed, increase exercise, and increase veggies.  - Magnesium  Generalized anxiety disorder  stress management techniques discussed, increase water, good sleep hygiene discussed, increase exercise, and increase veggies.  - states unable to take any SSRI's or SNRI's, explained need to decrease use of xanax or will refer to psych due to use of opioids.   Anemia, unspecified anemia type - Iron and TIBC - Ferritin - Vitamin B12  DDD (degenerative disc disease), cervical Continue follow up Dr. [name]   Osteopenia Osteopenia- continue Vit D and Ca, weight bearing exercises  Essential hypertension - continue medications, DASH diet, exercise and monitor at home. Call if greater than 130/80. - CBC with Differential/Platelet - BASIC METABOLIC PANEL WITH GFR - Hepatic function panel - TSH - Urinalysis, Routine w reflex microscopic (not at [facility]) - Microalbumin / creatinine urine ratio - EKG 12-Lead  Routine general medical examination at a health care facility Wants to see [facility], her mom goes there and would like to see her, Dr. Laural Benes - CBC with Differential/Platelet - BASIC METABOLIC PANEL WITH GFR - Hepatic function panel - TSH - Lipid panel - Hemoglobin A1c - Insulin, fasting - Magnesium - Vit D  25 hydroxy (rtn osteoporosis monitoring) - Urinalysis, Routine w reflex microscopic (not at [facility]) - Microalbumin / creatinine urine ratio - Iron and TIBC - Ferritin - Vitamin B12   Palpitations with some SOB At [place], has been a year, suspect esophageal  spasm/silent reflux but with walking up stairs, flushing, and family history afib will refer to cardio for possible holter monitor. Negative sleep study Will check Ddimer, low risk but on estrogen, brother with stroke at 42- had + ddimer of 4, with symptoms and family history  Will get CTA ? From anxiety- declines meds at this time, may need referral counselor/psych  Trouble swallowing/shortness of breath on her right side Has has 2 neck surgeries, ? Compressive symptoms versus GERD Get on PPI, follow up GI, ? Need for barium swallow versus CT   Discussed med's effects and SE's. Screening labs and tests as requested with regular follow-up as recommended. Over 60 minutes of exam, counseling, chart review, and complex, high level critical decision making was performed this visit.   HPI 50 y.o. female  presents for a complete physical.  Her blood pressure has been controlled at home, today their BP is BP: 118/66 She does not workout due to chronic pain of back/neck.  She denies chest pain, shortness of breath, dizziness.  Has had two cervical neck issues, she has noticed last few weeks that when she lies on her right side she feels that her airway will close on her, she has trouble swallowing bread/meat, and occ with water.  Continues to have palpitations worse at night, bending over with some sweating/flushing from it, lasting for seconds/mins, Nothing better.  She is not on cholesterol medication and denies myalgias. Her cholesterol is at goal. The cholesterol last visit was:   Lab Results  Component Value Date   CHOL 190 11/27/2014   HDL 47 11/27/2014   LDLCALC 126 11/27/2014   TRIG 86 11/27/2014   CHOLHDL 4.0 11/27/2014  Last A1C in the office was:  Lab Results  Component Value Date   HGBA1C 5.3 11/27/2014   Patient is on Vitamin D supplement, she is on 6000 IU daily.   Lab Results  Component Value Date   VD25OH 51 11/27/2014     Is on estrogen patch for osteopenia and  menopause from Dr. Jennette Kettle, due DEXA 2017, on bASA.  She follows with Dr. [name] for her neck, but has lower back pain, has had injection with him that helped. 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 Dad MI at 18, mom with afib . She is having headaches daily, right eye and behind it. Has seen Dr. Eulah Pont in the past.  BMI is Body mass index is 25.05 kg/m., she is working on diet and exercise. Wt Readings from Last 3 Encounters:  11/28/15 148 lb 3.2 oz (67.2 kg)  05/15/15 151 lb (68.5 kg)  04/25/15 150 lb 6.4 oz (68.2 kg)     Current Medications:    Medication List       Accurate as of 11/28/15  2:22 PM. Always use your most recent med list.          ALPRAZolam 0.25 MG tablet Commonly known as:  XANAX TAKE 1 TABLET BY MOUTH 3 TIMES A DAY AS NEEDED FOR ANXIETY   ASPIRIN LOW DOSE ADULT PO Take by mouth.   CALCIUM PO Take 500 mg by mouth 2 (two) times daily.   estradiol 0.1 MG/24HR patch Commonly known as:  VIVELLE-DOT Place 1 patch onto the skin 2 (two) times a week.   HAIR/SKIN/NAILS PO Take by mouth.   HYDROcodone-acetaminophen 5-325 MG tablet Commonly known as:  NORCO/VICODIN Take 1 tablet by mouth every 6 (six) hours as needed for moderate pain.   LUTEIN PO Take 25 mg by mouth daily.   pregabalin 50 MG capsule Commonly known as:  LYRICA Take 50 mg by mouth 2 (two) times daily.   VITAMIN B-12 PO Take 1,000 mcg by mouth daily.   Vitamin D 2000 units Caps Take 2,000 Units by mouth 3 (three) times daily.       Health Maintenance:   Immunization History  Administered Date(s) Administered  . Tdap 10/22/2011   Tetanus: 2013 Pneumovax: Prevnar 13: due age 44 Flu vaccine: declines Zostavax:  Pap: 08/2014 get with Dr. Jennette Kettle, is due MGM:08/2014 get with Dr. Jennette Kettle DEXA: 08/2013 Osteopenia Dr. Jennette Kettle Colonoscopy: is scheduled with Dr. Loreta Ave, never got EGD: MRI cervical  2015  Eye: Dr. Emily Filbert   Patient Care Team: Lucky Cowboy, MD as PCP - General (Internal Medicine) Manning Charity, OD as Referring Physician (Optometry) Lacretia Nicks Varney Baas, MD as Consulting Physician (Obstetrics and Gynecology) Janalyn Harder, MD as Consulting Physician (Dermatology) Charna Elizabeth, MD as Consulting Physician (Gastroenterology)  Medical History:  Past Medical History:  Diagnosis Date  . Allergy   . Anemia   . Arthritis    in hands  . DDD (degenerative disc disease)   . Headache   . Hyperlipidemia    diet controlled  . PONV (postoperative nausea and vomiting)   . Unspecified vitamin D deficiency    Allergies Allergies  Allergen Reactions  . Erythromycin Hives  . Penicillins Hives  . Prednisone Other (See Comments)    pain  . Tetracyclines & Related Hives    SURGICAL HISTORY She  has a past surgical history that includes Diagnostic laparoscopy;  Abdominal hysterectomy; Tonsillectomy; Cervical disc surgery; Anterior cervical decomp/discectomy fusion (03/29/2012); and Cesarean section. FAMILY HISTORY Her family history includes Cancer in her father and maternal grandfather; Diabetes in her maternal aunt; Heart disease in her father; Hyperlipidemia in her father and mother; Hypertension in her father and mother. SOCIAL HISTORY She  reports that she quit smoking about 4 years ago. She quit after 3.00 years of use. She does not have any smokeless tobacco history on file. She reports that she does not drink alcohol or use drugs.  Review of Systems  Constitutional: Positive for malaise/fatigue. Negative for chills, diaphoresis, fever and weight loss.  HENT: Negative for congestion, ear discharge, ear pain, hearing loss, nosebleeds, sore throat and tinnitus.   Eyes: Negative for blurred vision, double vision, photophobia, pain, discharge and redness.  Respiratory: Negative.  Negative for stridor.   Cardiovascular: Negative.   Gastrointestinal: Negative.   Genitourinary:  Negative.   Musculoskeletal: Positive for back pain. Negative for falls, joint pain, myalgias and neck pain.  Skin: Negative.   Neurological: Negative for dizziness, tingling, tremors, sensory change, speech change, focal weakness, seizures, loss of consciousness, weakness and headaches.  Psychiatric/Behavioral: Negative.     Physical Exam: Estimated body mass index is 25.05 kg/m as calculated from the following:   Height as of this encounter: 5' 4.5" (1.638 m).   Weight as of this encounter: 148 lb 3.2 oz (67.2 kg). BP 118/66   Pulse 86   Temp 97.7 F (36.5 C)   Resp 16   Ht 5' 4.5" (1.638 m)   Wt 148 lb 3.2 oz (67.2 kg)   SpO2 96%   BMI 25.05 kg/m  General Appearance: Well nourished, in no apparent distress. Eyes: PERRLA, EOMs, conjunctiva no swelling or erythema, normal fundi and vessels. Sinuses: No Frontal/maxillary tenderness ENT/Mouth: Ext aud canals clear, normal light reflex with TMs without erythema, bulging.  Good dentition. No erythema, swelling, or exudate on post pharynx. Tonsils not swollen or erythematous. Hearing normal.  Neck: Supple, thyroid with some nodules and possible very slight goiter. No bruits Respiratory: Respiratory effort normal, BS equal bilaterally without rales, rhonchi, wheezing or stridor. Cardio: RRR without murmurs, rubs or gallops. Brisk peripheral pulses without edema.  Chest: symmetric, with normal excursions and percussion. Breasts: defer Abdomen: Soft, +BS.+ epigastric tenderness, no guarding, rebound, hernias, masses, or organomegaly. .  Lymphatics: Non tender without lymphadenopathy.  Genitourinary: defer Musculoskeletal: Full ROM all peripheral extremities,5/5 strength, and normal gait. Skin: Warm, dry without rashes, lesions, ecchymosis.  Neuro: Cranial nerves intact, reflexes equal bilaterally. Normal muscle tone, no cerebellar symptoms. Sensation intact.  Psych: Awake and oriented X 3, normal affect, Insight and Judgment  appropriate.   EKG: WNL no changes.   [name] MullingAmanda Raysha Smith 2:22 PM

## 2015-11-29 LAB — URINALYSIS, ROUTINE W REFLEX MICROSCOPIC
Bilirubin Urine: NEGATIVE
Glucose, UA: NEGATIVE
Hgb urine dipstick: NEGATIVE
Ketones, ur: NEGATIVE
Leukocytes, UA: NEGATIVE
Nitrite: NEGATIVE
Protein, ur: NEGATIVE
Specific Gravity, Urine: 1.024 (ref 1.001–1.035)
pH: 5.5 (ref 5.0–8.0)

## 2015-11-29 LAB — MICROALBUMIN / CREATININE URINE RATIO
Creatinine, Urine: 270 mg/dL (ref 20–320)
Microalb Creat Ratio: 3 mcg/mg creat (ref <30)
Microalb, Ur: 0.8 mg/dL

## 2015-11-29 LAB — D-DIMER, QUANTITATIVE (NOT AT ARMC): D-Dimer, Quant: 4.27 mcg/mL FEU — ABNORMAL HIGH (ref <0.50)

## 2015-11-29 LAB — VITAMIN D 25 HYDROXY (VIT D DEFICIENCY, FRACTURES): Vit D, 25-Hydroxy: 58 ng/mL (ref 30–100)

## 2015-11-29 NOTE — Addendum Note (Signed)
Addended by: Quentin MullingOLLIER, Unknown Schleyer R on: 11/29/2015 11:20 AM   Modules accepted: Orders

## 2015-11-30 ENCOUNTER — Ambulatory Visit (HOSPITAL_COMMUNITY)
Admission: RE | Admit: 2015-11-30 | Discharge: 2015-11-30 | Disposition: A | Discharge status: Home / Self Care | Payer: Commercial Managed Care - HMO | Source: Ambulatory Visit | Attending: Physician Assistant | Admitting: Physician Assistant

## 2015-11-30 DIAGNOSIS — R0602 Shortness of breath: Secondary | ICD-10-CM | POA: Diagnosis present

## 2015-11-30 DIAGNOSIS — R7989 Other specified abnormal findings of blood chemistry: Secondary | ICD-10-CM

## 2015-11-30 DIAGNOSIS — R002 Palpitations: Secondary | ICD-10-CM | POA: Diagnosis present

## 2015-11-30 DIAGNOSIS — R791 Abnormal coagulation profile: Secondary | ICD-10-CM | POA: Diagnosis present

## 2015-11-30 MED ORDER — IOPAMIDOL (ISOVUE-370) INJECTION 76%
INTRAVENOUS | Status: AC
  Filled 2015-11-30: qty 100

## 2015-12-12 ENCOUNTER — Telehealth: Payer: Self-pay

## 2015-12-12 NOTE — Telephone Encounter (Signed)
Spoke with pt about the referral person being out until Thursday & as soon as she returns she will be working on box & get that referral worked on. Pt agreed & voiced understanding.

## 2015-12-19 ENCOUNTER — Ambulatory Visit: Payer: 59 | Admitting: Cardiovascular Disease

## 2016-01-09 ENCOUNTER — Ambulatory Visit: Payer: Commercial Managed Care - HMO | Admitting: Cardiovascular Disease

## 2016-01-17 ENCOUNTER — Encounter: Payer: Self-pay | Admitting: Physician Assistant

## 2016-01-17 ENCOUNTER — Ambulatory Visit (INDEPENDENT_AMBULATORY_CARE_PROVIDER_SITE_OTHER): Payer: Commercial Managed Care - HMO | Admitting: Physician Assistant

## 2016-01-17 VITALS — BP 120/76 | HR 70 | Temp 97.7°F | Resp 16 | Ht 64.5 in | Wt 150.0 lb

## 2016-01-17 DIAGNOSIS — S022XXA Fracture of nasal bones, initial encounter for closed fracture: Secondary | ICD-10-CM

## 2016-01-17 NOTE — Patient Instructions (Signed)
Nasal Fracture A nasal fracture is a break or crack in the bones or cartilage of the nose. Minor breaks do not require treatment. These breaks usually heal on their own after about one month. Serious breaks may require surgery. CAUSES This injury is usually caused by a blunt injury to the nose. This type of injury often occurs from:  Contact sports.  Car accidents.  Falls.  Getting punched. SYMPTOMS Symptoms of this injury include:  Pain.  Swelling of the nose.  Bleeding from the nose.  Bruising around the nose or eyes. This may include having black eyes.  Crooked appearance of the nose. DIAGNOSIS This injury may be diagnosed with a physical exam. The health care provider will gently feel the nose for signs of broken bones. He or she will look inside the nostrils to make sure that there is not a blood-filled swelling on the dividing wall between the nostrils (septal hematoma). X-rays of the nose may not show a nasal fracture even when one is present. In some cases, X-rays or a CT scan may be done 1-5 days after the injury. Sometimes, the health care provider will want to wait until the swelling has gone down. TREATMENT Often, minor fractures that have caused no deformity do not require treatment. More serious fractures in which bones have moved out of position may require surgery, which will take place after the swelling is gone. Surgery will stabilize and align the fracture. In some cases, a health care provider may be able to reposition the bones without surgery. This may be done in the health care provider's office after medicine is given to numb the area (local anesthetic). HOME CARE INSTRUCTIONS  If directed, apply ice to the injured area:  Put ice in a plastic bag.  Place a towel between your skin and the bag.  Leave the ice on for 20 minutes, 2-3 times per day.  Take over-the-counter and prescription medicines only as told by your health care provider.  If your nose  starts to bleed, sit in an upright position while you squeeze the soft parts of your nose against the dividing wall between your nostrils (septum) for 10 minutes.  Try to avoid blowing your nose.  Return to your normal activities as told by your health care provider. Ask your health care provider what activities are safe for you.  Avoid contact sports for 3-4 weeks or as told by your health care provider.  Keep all follow-up visits as told by your health care provider. This is important. SEEK MEDICAL CARE IF:  Your pain increases or becomes severe.  You continue to have nosebleeds.  The shape of your nose does not return to normal within 5 days.  You have pus draining out of your nose. SEEK IMMEDIATE MEDICAL CARE IF:  You have bleeding from your nose that does not stop after you pinch your nostrils closed for 20 minutes and keep ice on your nose.  You have clear fluid draining out of your nose.  You notice a grape-like swelling on the septum. This swelling is a collection of blood (hematoma) that must be drained to help prevent infection.  You have difficulty moving your eyes.  You have repeated vomiting.   This information is not intended to replace advice given to you by your health care provider. Make sure you discuss any questions you have with your health care provider.   Document Released: 02/29/2000 Document Revised: 11/22/2014 Document Reviewed: 04/10/2014 Elsevier Interactive Patient Education 2016 [employer]  

## 2016-01-17 NOTE — Progress Notes (Signed)
Subjective:    Patient ID: [name] Smith, female    DOB: 01/18/1966, 50 y.o.   MRN: [id number]  HPI 50 y.o. WF presents with nose pain. She states on Tuesday had dog mix, shepard mix puppy, jump up and hit her nose. She has had swelling, nasal pain, left more than right, dull HA, feels stopped up on the left side. No LOC but says she "saw stars". She has had several injuries in the past, 3 total, was going to get a repair in the past, does have snoring negative sleep study, feels stuffy left side constantly, worse this past time. No recurrent sinus infections.   Blood pressure 120/76, pulse 70, temperature 97.7 F (36.5 C), resp. rate 16, height 5' 4.5" (1.638 m), weight 150 lb (68 kg), SpO2 98 %.  Medications Current Outpatient Prescriptions on File Prior to Visit  Medication Sig  . ALPRAZolam (XANAX) 0.25 MG tablet TAKE 1 TABLET BY MOUTH 3 TIMES A DAY AS NEEDED FOR ANXIETY  . Biotin w/ Vitamins C & E (HAIR/SKIN/NAILS PO) Take by mouth.  Marland Kitchen. CALCIUM PO Take 500 mg by mouth 2 (two) times daily.  . Cholecalciferol (VITAMIN D) 2000 UNITS CAPS Take 2,000 Units by mouth 3 (three) times daily.   . Cyanocobalamin (VITAMIN B-12 PO) Take 1,000 mcg by mouth daily.  Marland Kitchen. HYDROcodone-acetaminophen (NORCO/VICODIN) 5-325 MG tablet Take 1 tablet by mouth every 6 (six) hours as needed for moderate pain.  Marland Kitchen. LUTEIN PO Take 25 mg by mouth daily.  . pregabalin (LYRICA) 50 MG capsule Take 50 mg by mouth 2 (two) times daily.   No current facility-administered medications on file prior to visit.     Problem list She has Herniated nucleus pulposus with myelopathy, cervical; Hyperlipidemia; Anemia; Vitamin D deficiency; DDD (degenerative disc disease), cervical; Depression; Generalized anxiety disorder; Osteopenia; Essential hypertension; Insomnia secondary to chronic pain; and Spinal stenosis in cervical region on her problem list.   Review of Systems  Constitutional: Negative.   HENT: Positive for congestion  (left side), facial swelling and rhinorrhea. Negative for nosebleeds and trouble swallowing.   Respiratory: Negative.   Cardiovascular: Negative.   Gastrointestinal: Negative.   Genitourinary: Negative.   Musculoskeletal: Negative.   Skin: Negative.   Neurological: Negative.   Hematological: Negative.   Psychiatric/Behavioral: Negative.        Objective:   Physical Exam  Constitutional: She is oriented to person, place, and time. She appears well-developed and well-nourished.  HENT:  Head: Normocephalic and atraumatic. Head is without raccoon's eyes, without Battle's sign, without right periorbital erythema and without left periorbital erythema.  Right Ear: External ear normal.  Left Ear: External ear normal.  Nose: Sinus tenderness (bridge of nose with palpable/tender bumb on right bridge, with septal deviation to the left and overgrowth of septum), nasal deformity and septal deviation present. No nose lacerations or nasal septal hematoma. No epistaxis.  No foreign bodies. Right sinus exhibits no maxillary sinus tenderness and no frontal sinus tenderness. Left sinus exhibits no maxillary sinus tenderness and no frontal sinus tenderness.  Mouth/Throat: Oropharynx is clear and moist.  Eyes: Conjunctivae and EOM are normal. Pupils are equal, round, and reactive to light.  Neck: Normal range of motion. Neck supple. No thyromegaly present.  Cardiovascular: Normal rate, regular rhythm and normal heart sounds.  Exam reveals no gallop and no friction rub.   No murmur heard. Pulmonary/Chest: Effort normal and breath sounds normal. No respiratory distress. She has no wheezes.  Abdominal: Soft. Bowel sounds  are normal. She exhibits no distension and no mass. There is no tenderness. There is no rebound and no guarding.  Musculoskeletal: Normal range of motion.  Lymphadenopathy:    She has no cervical adenopathy.  Neurological: She is alert and oriented to person, place, and time. She displays  normal reflexes. No cranial nerve deficit. Coordination normal.  Skin: Skin is warm and dry.  Psychiatric: She has a normal mood and affect.      Assessment & Plan:  Nasal fracture/obstruction Will refer to plastic surgery for possible correction Will get water views, will wait on CT pending consult

## 2016-01-28 ENCOUNTER — Encounter: Payer: Self-pay | Admitting: Cardiovascular Disease

## 2016-01-28 ENCOUNTER — Ambulatory Visit (INDEPENDENT_AMBULATORY_CARE_PROVIDER_SITE_OTHER): Payer: Commercial Managed Care - HMO | Admitting: Cardiovascular Disease

## 2016-01-28 VITALS — BP 108/80 | HR 76 | Ht 64.5 in | Wt 150.8 lb

## 2016-01-28 DIAGNOSIS — I341 Nonrheumatic mitral (valve) prolapse: Secondary | ICD-10-CM | POA: Insufficient documentation

## 2016-01-28 DIAGNOSIS — I493 Ventricular premature depolarization: Secondary | ICD-10-CM | POA: Diagnosis not present

## 2016-01-28 DIAGNOSIS — I10 Essential (primary) hypertension: Secondary | ICD-10-CM | POA: Diagnosis not present

## 2016-01-28 NOTE — Progress Notes (Signed)
Cardiology Office Note   Date:  01/28/2016   ID:  [name] Smith, DOB 07/27/1965, MRN [id number]  PCP:  Nadean CorwinMCKEOWN,WILLIAM DAVID, MD  Cardiologist:   Kristeen MissPhilip Nahser, MD   Chief Complaint  Patient presents with  . Palpitations   Problem list 1. Palpitations 2. Hyperlipidemia 3. Cervical spine surgery - is on Lyrica for neuropathy pain .    History of Present Illness: [name] Smith  [name] ) is a 50 y.o. female who presents for further evaluation of her palpitations.  ( Daughter of [name] )   Has noticed some palpitations on occasion.     Intermittent   It's intermittent and not constant. She's going through menopause. These palpitations have been occurring for the past couple of months. There is no associated syncope or presyncope. There is no chest pain or shortness breath.  Does not get regular exercise .  Just got a new puppy .   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    Past Medical History:  Diagnosis Date  . Allergy   . Anemia   . Arthritis    in hands  . DDD (degenerative disc disease)   . Headache   . Hyperlipidemia    diet controlled  . PONV (postoperative nausea and vomiting)   . Unspecified vitamin D deficiency     Past Surgical History:  Procedure Laterality Date  . ABDOMINAL HYSTERECTOMY    . ANTERIOR CERVICAL DECOMP/DISCECTOMY FUSION  03/29/2012   Procedure: ANTERIOR CERVICAL DECOMPRESSION/DISCECTOMY FUSION 1 LEVEL;  Surgeon: Temple PaciniHenry A Pool, MD;  Location: [facility];  Service: Neurosurgery;  Laterality: Bilateral;  Cervical four-five anterior cervical discectomy with fusion   . CERVICAL DISC SURGERY    . CESAREAN SECTION    . DIAGNOSTIC LAPAROSCOPY    . TONSILLECTOMY       Current Outpatient Prescriptions  Medication Sig Dispense Refill  . ALPRAZolam (XANAX) 0.25 MG tablet TAKE 1 TABLET BY MOUTH 3 TIMES A DAY AS NEEDED FOR ANXIETY 90 tablet 0  . Biotin w/ Vitamins C & E (HAIR/SKIN/NAILS PO) Take by mouth.    Marland Kitchen. CALCIUM PO Take  500 mg by mouth 2 (two) times daily.    . Cholecalciferol (VITAMIN D) 2000 UNITS CAPS Take 2,000 Units by mouth 3 (three) times daily.     . Cyanocobalamin (VITAMIN B-12 PO) Take 1,000 mcg by mouth daily.    Marland Kitchen. HYDROcodone-acetaminophen (NORCO/VICODIN) 5-325 MG tablet Take 1 tablet by mouth every 6 (six) hours as needed for moderate pain.    Marland Kitchen. LUTEIN PO Take 25 mg by mouth daily.    . pregabalin (LYRICA) 75 MG capsule Take 75 mg by mouth daily.      No current facility-administered medications for this visit.     Allergies:   Erythromycin; Penicillins; Prednisone; and Tetracyclines & related    Social History:  The patient  reports that she quit smoking about 4 years ago. She quit after 3.00 years of use. She has never used smokeless tobacco. She reports that she does not drink alcohol or use drugs.   Family History:  The patient's family history includes Cancer in her father and maternal grandfather; Diabetes in her maternal aunt; Heart disease in her father; Hyperlipidemia in her father and mother; Hypertension in her father and mother.    ROS:  Please see the history of present illness.    Review of Systems: Constitutional:  denies fever, chills, diaphoresis, appetite change and fatigue.  HEENT:  denies photophobia, eye pain, redness, hearing loss, ear pain, congestion, sore throat, rhinorrhea, sneezing, neck pain, neck stiffness and tinnitus.  Respiratory: denies SOB, DOE, cough, chest tightness, and wheezing.  Cardiovascular: denies chest pain, palpitations and leg swelling.  Gastrointestinal: denies nausea, vomiting, abdominal pain, diarrhea, constipation, blood in stool.  Genitourinary: denies dysuria, urgency, frequency, hematuria, flank pain and difficulty urinating.  Musculoskeletal: denies  myalgias, back pain, joint swelling, arthralgias and gait problem.   Skin: denies pallor, rash and wound.  Neurological: denies dizziness, seizures, syncope, weakness, light-headedness,  numbness and headaches.   Hematological: denies adenopathy, easy bruising, personal or family bleeding history.  Psychiatric/ Behavioral: denies suicidal ideation, mood changes, confusion, nervousness, sleep disturbance and agitation.       All other systems are reviewed and negative.    PHYSICAL EXAM: VS:  BP 108/80 (BP Location: Right Arm)   Pulse 76   Ht 5' 4.5" (1.638 m)   Wt 150 lb 12.8 oz (68.4 kg)   SpO2 99%   BMI 25.49 kg/m  , BMI Body mass index is 25.49 kg/m. GEN: Well nourished, well developed, in no acute distress  HEENT: normal  Neck: no JVD, carotid bruits, or masses Cardiac: RRR;  Mid systolic click  no murmurs heart,  no edema  Respiratory:  clear to auscultation bilaterally, normal work of breathing GI: soft, nontender, nondistended, + BS MS: no deformity or atrophy  Skin: warm and dry, no rash Neuro:  Strength and sensation are intact Psych: normal   EKG:  EKG is ordered today. The ekg ordered today demonstrates  NSR at 73.   Occasional PVCS    Recent Labs: 11/28/2015: ALT 6; BUN 13; Creat 0.69; Hemoglobin 12.4; Magnesium 2.0; Platelets 322; Potassium 4.0; Sodium 139; TSH 0.61    Lipid Panel    Component Value Date/Time   CHOL 182 11/28/2015 1506   TRIG 69 11/28/2015 1506   HDL 51 11/28/2015 1506   CHOLHDL 3.6 11/28/2015 1506   VLDL 14 11/28/2015 1506   LDLCALC 117 11/28/2015 1506      Wt Readings from Last 3 Encounters:  01/28/16 150 lb 12.8 oz (68.4 kg)  01/17/16 150 lb (68 kg)  11/28/15 148 lb 3.2 oz (67.2 kg)      Other studies Reviewed: Additional studies/ records that were reviewed today include: . Review of the above records demonstrates:    ASSESSMENT AND PLAN:  1.  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  We'll place a 24-hour monitor on her to get some idea of her PVC burden. I've recommended that she drink V8 juice daily. I'll see her in 3 months for follow-up visit.  2.   Probable  mitral valve prolapse:  She has a mid systolic click ( vs widely split S2)  I do not hear significant mitral regurgitation. We'll get an echocardiogram for further evaluation.  3.  Elevated d-dimer level. Her medical doctor drew a d-dimer level because of her palpitations. A CT angiogram revealed no evidence of pulmonary embolus.   Current medicines are reviewed at length with the patient today.  The patient does not have concerns regarding medicines.  Labs/ tests ordered today include:  No orders of the defined types were placed in this encounter.  Disposition:   FU with me in 3 months    Kristeen MissPhilip Nahser, MD  01/28/2016 9:57 AM    [facility] [address], [place], [place]  [address] Phone: [phone]; Fax: [phone]

## 2016-01-28 NOTE — Patient Instructions (Signed)
Your physician recommends that you continue on your current medications as directed. Please refer to the Current Medication list given to you today.  Your physician has requested that you have an echocardiogram. Echocardiography is a painless test that uses sound waves to create images of your heart. It provides your doctor with information about the size and shape of your heart and how well your heart's chambers and valves are working. This procedure takes approximately one hour. There are no restrictions for this procedure.  Your physician has recommended that you wear a holter monitor. Holter monitors are medical devices that record the heart's electrical activity. Doctors most often use these monitors to diagnose arrhythmias. Arrhythmias are problems with the speed or rhythm of the heartbeat. The monitor is a small, portable device. You can wear one while you do your normal daily activities. This is usually used to diagnose what is causing palpitations/syncope (passing out).  Your physician recommends that you schedule a follow-up appointment in: 3 months with Dr. Elease HashimotoNahser.

## 2016-02-06 ENCOUNTER — Ambulatory Visit (INDEPENDENT_AMBULATORY_CARE_PROVIDER_SITE_OTHER): Payer: Commercial Managed Care - HMO

## 2016-02-06 DIAGNOSIS — I493 Ventricular premature depolarization: Secondary | ICD-10-CM | POA: Diagnosis not present

## 2016-02-06 DIAGNOSIS — I341 Nonrheumatic mitral (valve) prolapse: Secondary | ICD-10-CM

## 2016-02-20 ENCOUNTER — Other Ambulatory Visit: Payer: Self-pay

## 2016-02-20 ENCOUNTER — Ambulatory Visit (HOSPITAL_COMMUNITY): Payer: Commercial Managed Care - HMO | Attending: Cardiology

## 2016-02-20 DIAGNOSIS — I340 Nonrheumatic mitral (valve) insufficiency: Secondary | ICD-10-CM | POA: Insufficient documentation

## 2016-02-20 DIAGNOSIS — I493 Ventricular premature depolarization: Secondary | ICD-10-CM | POA: Insufficient documentation

## 2016-02-20 DIAGNOSIS — I341 Nonrheumatic mitral (valve) prolapse: Secondary | ICD-10-CM | POA: Insufficient documentation

## 2016-03-31 DIAGNOSIS — M7742 Metatarsalgia, left foot: Secondary | ICD-10-CM | POA: Diagnosis not present

## 2016-04-11 ENCOUNTER — Other Ambulatory Visit: Payer: Self-pay | Admitting: Physician Assistant

## 2016-04-11 MED ORDER — ALPRAZOLAM 0.25 MG PO TABS: ORAL_TABLET | ORAL | 0 refills | Status: DC

## 2016-04-11 NOTE — Progress Notes (Signed)
Xanax was called into pharmacy 

## 2016-04-16 DIAGNOSIS — M5416 Radiculopathy, lumbar region: Secondary | ICD-10-CM | POA: Diagnosis not present

## 2016-04-16 DIAGNOSIS — M545 Low back pain: Secondary | ICD-10-CM | POA: Diagnosis not present

## 2016-04-16 DIAGNOSIS — M533 Sacrococcygeal disorders, not elsewhere classified: Secondary | ICD-10-CM | POA: Diagnosis not present

## 2016-04-24 ENCOUNTER — Encounter: Payer: Self-pay | Admitting: Cardiovascular Disease

## 2016-04-29 ENCOUNTER — Encounter (INDEPENDENT_AMBULATORY_CARE_PROVIDER_SITE_OTHER): Payer: Self-pay

## 2016-04-29 ENCOUNTER — Ambulatory Visit (INDEPENDENT_AMBULATORY_CARE_PROVIDER_SITE_OTHER): Payer: Commercial Managed Care - HMO | Admitting: Cardiovascular Disease

## 2016-04-29 ENCOUNTER — Encounter: Payer: Self-pay | Admitting: Cardiovascular Disease

## 2016-04-29 VITALS — BP 110/70 | HR 70 | Ht 64.5 in | Wt 150.8 lb

## 2016-04-29 DIAGNOSIS — I10 Essential (primary) hypertension: Secondary | ICD-10-CM | POA: Diagnosis not present

## 2016-04-29 MED ORDER — PROPRANOLOL HCL 10 MG PO TABS: 10.0000 mg | ORAL_TABLET | Freq: Four times a day (QID) | ORAL | 11 refills | Status: DC | PRN

## 2016-04-29 MED ORDER — METOPROLOL SUCCINATE ER 25 MG PO TB24: 25.0000 mg | ORAL_TABLET | Freq: Every day | ORAL | 11 refills | Status: DC

## 2016-04-29 NOTE — Patient Instructions (Signed)
Medication Instructions:  START Toprol (Metoprolol) 25 mg once daily START Propranolol 10 mg up to 4 times per day as needed in addition to the Toprol for fast heart rate/palpitations   Labwork: None ordered   Testing/Procedures: None Ordered   Follow-Up: Your physician recommends that you schedule a follow-up appointment in: 3 months with Dr. Elease HashimotoNahser   If you need a refill on your cardiac medications before your next appointment, please call your pharmacy.   Thank you for choosing [facility]! Eligha BridegroomMichelle Swinyer, RN [phone]

## 2016-04-29 NOTE — Progress Notes (Signed)
Cardiology Office Note   Date:  04/29/2016   ID:  [name], DOB 07/24/1965, MRN [id number]  PCP:  Nadean Corwin, MD  Cardiologist:   Kristeen Miss, MD   Chief Complaint  Patient presents with  . Mitral Valve Prolapse   Problem list 1. Palpitations 2. Hyperlipidemia 3. Cervical spine surgery - is on Lyrica for neuropathy pain .    History of Present Illness: [name] Smith  [facility] ) is a 51 y.o. female who presents for further evaluation of her palpitations.  ( Daughter of [name] Smith )   Has noticed some palpitations on occasion.     Intermittent   It's intermittent and not constant. She's going through menopause. These palpitations have been occurring for the past couple of months. There is no associated syncope or presyncope. There is no chest pain or shortness breath.  Does not get regular exercise .  Just got a new puppy .   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   Feb. 13, 2018:  [name] was seen several months ago for palpitations. She had an echocardiogram on 02/20/2015 which reveals normal left ventricular systolic function. She has grade 2 diastolic dysfunction.  Has been diagnosed with osteoperosis. She had stopped the hormone patch Her doctor wanted her to ask me about hormone replacement therapy   Past Medical History:  Diagnosis Date  . Allergy   . Anemia   . Arthritis    in hands  . DDD (degenerative disc disease)   . Headache   . Hyperlipidemia    diet controlled  . PONV (postoperative nausea and vomiting)   . Unspecified vitamin D deficiency     Past Surgical History:  Procedure Laterality Date  . ABDOMINAL HYSTERECTOMY    . ANTERIOR CERVICAL DECOMP/DISCECTOMY FUSION  03/29/2012   Procedure: ANTERIOR CERVICAL DECOMPRESSION/DISCECTOMY FUSION 1 LEVEL;  Surgeon: Temple Pacini, MD;  Location: [facility];  Service: Neurosurgery;  Laterality: Bilateral;  Cervical four-five anterior cervical discectomy with fusion   . CERVICAL  DISC SURGERY    . CESAREAN SECTION    . DIAGNOSTIC LAPAROSCOPY    . TONSILLECTOMY       Current Outpatient Prescriptions  Medication Sig Dispense Refill  . ALPRAZolam (XANAX) 0.25 MG tablet TAKE 1 TABLET BY MOUTH 3 TIMES A DAY AS NEEDED FOR ANXIETY 90 tablet 0  . Biotin w/ Vitamins C & E (HAIR/SKIN/NAILS PO) Take 2 tablets by mouth daily.     Marland Kitchen CALCIUM PO Take 500 mg by mouth 2 (two) times daily.    . Cholecalciferol (VITAMIN D) 2000 UNITS CAPS Take 2,000 Units by mouth 3 (three) times daily.     . Cyanocobalamin (VITAMIN B-12 PO) Take 1,000 mcg by mouth daily.    Marland Kitchen HYDROcodone-acetaminophen (NORCO/VICODIN) 5-325 MG tablet Take 1 tablet by mouth every 6 (six) hours as needed for moderate pain.    . pregabalin (LYRICA) 75 MG capsule Take 75 mg by mouth daily.      No current facility-administered medications for this visit.     Allergies:   Erythromycin; Penicillins; Prednisone; and Tetracyclines & related    Social History:  The patient  reports that she quit smoking about 4 years ago. She quit after 3.00 years of use. She has never used smokeless tobacco. She reports that she does not drink alcohol or use drugs.   Family History:  The patient's family history includes Cancer in her father and maternal grandfather; Diabetes in her  maternal aunt; Heart disease in her father; Hyperlipidemia in her father and mother; Hypertension in her father and mother.    ROS:  Please see the history of present illness.    Review of Systems: Constitutional:  denies fever, chills, diaphoresis, appetite change and fatigue.  HEENT: denies photophobia, eye pain, redness, hearing loss, ear pain, congestion, sore throat, rhinorrhea, sneezing, neck pain, neck stiffness and tinnitus.  Respiratory: denies SOB, DOE, cough, chest tightness, and wheezing.  Cardiovascular: denies chest pain, palpitations and leg swelling.  Gastrointestinal: denies nausea, vomiting, abdominal pain, diarrhea, constipation, blood  in stool.  Genitourinary: denies dysuria, urgency, frequency, hematuria, flank pain and difficulty urinating.  Musculoskeletal: denies  myalgias, back pain, joint swelling, arthralgias and gait problem.   Skin: denies pallor, rash and wound.  Neurological: denies dizziness, seizures, syncope, weakness, light-headedness, numbness and headaches.   Hematological: denies adenopathy, easy bruising, personal or family bleeding history.  Psychiatric/ Behavioral: denies suicidal ideation, mood changes, confusion, nervousness, sleep disturbance and agitation.       All other systems are reviewed and negative.    PHYSICAL EXAM: VS:  BP 110/70 (BP Location: Left Arm, Patient Position: Sitting, Cuff Size: Normal)   Pulse 70   Ht 5' 4.5" (1.638 m)   Wt 150 lb 12.8 oz (68.4 kg)   SpO2 96%   BMI 25.49 kg/m  , BMI Body mass index is 25.49 kg/m. GEN: Well nourished, well developed, in no acute distress  HEENT: normal  Neck: no JVD, carotid bruits, or masses Cardiac: RRR;  Mid systolic click  no murmurs heart,  no edema  Respiratory:  clear to auscultation bilaterally, normal work of breathing GI: soft, nontender, nondistended, + BS MS: no deformity or atrophy  Skin: warm and dry, no rash Neuro:  Strength and sensation are intact Psych: normal   EKG:  EKG is ordered today. The ekg ordered today demonstrates  NSR at 73.   Occasional PVCS    Recent Labs: 11/28/2015: ALT 6; BUN 13; Creat 0.69; Hemoglobin 12.4; Magnesium 2.0; Platelets 322; Potassium 4.0; Sodium 139; TSH 0.61    Lipid Panel    Component Value Date/Time   CHOL 182 11/28/2015 1506   TRIG 69 11/28/2015 1506   HDL 51 11/28/2015 1506   CHOLHDL 3.6 11/28/2015 1506   VLDL 14 11/28/2015 1506   LDLCALC 117 11/28/2015 1506      Wt Readings from Last 3 Encounters:  04/29/16 150 lb 12.8 oz (68.4 kg)  01/28/16 150 lb 12.8 oz (68.4 kg)  01/17/16 150 lb (68 kg)      Other studies Reviewed: Additional studies/ records that  were reviewed today include: . Review of the above records demonstrates:    ASSESSMENT AND PLAN:  1.  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  Holter monitor shows occasional PVCs Will start her on toprol XL 25 mg a day  Follow up with her in 3 months  2.  Probable  mitral valve prolapse:  She has a mid systolic click ( vs widely split S2)    3.  Elevated d-dimer level. Her medical doctor drew a d-dimer level because of her palpitations. A CT angiogram revealed no evidence of pulmonary embolus.   Current medicines are reviewed at length with the patient today.  The patient does not have concerns regarding medicines.  Labs/ tests ordered today include:  No orders of the defined types were placed in this encounter.  Disposition:  FU with me in 3 months    Kristeen Miss, MD  04/29/2016 2:45 PM    [facility] [address] [place], [place], [place]  [address] Phone: [phone]; Fax: [phone]

## 2016-05-06 DIAGNOSIS — M7742 Metatarsalgia, left foot: Secondary | ICD-10-CM | POA: Diagnosis not present

## 2016-05-28 ENCOUNTER — Telehealth: Payer: Self-pay | Admitting: Cardiovascular Disease

## 2016-05-28 NOTE — Telephone Encounter (Signed)
Agree with the plan by Eligha BridegroomMichelle Swinyer, RN  to decrease the toprol XL to 12. 5

## 2016-05-28 NOTE — Telephone Encounter (Signed)
Patient is calling states that she has been having issues with the Toprol medication. She states that the Toprol medication has not been effective, and makes her feel worse than she did before. Please call to discuss, thanks.

## 2016-05-28 NOTE — Telephone Encounter (Signed)
Spoke with patient who states she has not been able to tolerate the propranolol since taking it 2 times since getting the Rx after 2/13 visit with Dr. Elease HashimotoNahser. She states it made her very dizzy. She states she also has difficulty with the Toprol. States she started out taking in the mornings and she was very fatigued so she switched to taking it with dinner. States she feels pounding in chest after she takes it even though she otherwise feels relaxed. States it also makes her more sensitive to the tingling she has following neck surgery for which she takes Lyrica for. She states it seems to make her more sensitive to every bodily function. She has a low normal BP and I advised it could be lowering her BP. I advised that she may decrease Toprol dose to 12.5 mg daily and to call back in a week or so to let us know who she is feeling. She verbalized understanding and agreement with plan and thanked me for the call.

## 2016-06-09 NOTE — Telephone Encounter (Signed)
Attempted to call patient to see how she is feeling since reducing dose of Toprol; no answer and no voice mail.

## 2016-06-10 NOTE — Telephone Encounter (Signed)
Attempted to call patient to see how she is feeling. No answer on home number and no voice mail and cell phone voice mail is not set up.

## 2016-07-10 DIAGNOSIS — M545 Low back pain: Secondary | ICD-10-CM | POA: Diagnosis not present

## 2016-07-10 DIAGNOSIS — M5416 Radiculopathy, lumbar region: Secondary | ICD-10-CM | POA: Diagnosis not present

## 2016-07-10 DIAGNOSIS — M533 Sacrococcygeal disorders, not elsewhere classified: Secondary | ICD-10-CM | POA: Diagnosis not present

## 2016-08-04 ENCOUNTER — Ambulatory Visit (INDEPENDENT_AMBULATORY_CARE_PROVIDER_SITE_OTHER): Payer: Commercial Managed Care - HMO | Admitting: Cardiovascular Disease

## 2016-08-04 ENCOUNTER — Encounter: Payer: Self-pay | Admitting: Cardiovascular Disease

## 2016-08-04 ENCOUNTER — Encounter (INDEPENDENT_AMBULATORY_CARE_PROVIDER_SITE_OTHER): Payer: Self-pay

## 2016-08-04 VITALS — BP 94/64 | HR 74 | Ht 64.5 in | Wt 151.8 lb

## 2016-08-04 DIAGNOSIS — I493 Ventricular premature depolarization: Secondary | ICD-10-CM

## 2016-08-04 NOTE — Patient Instructions (Signed)

## 2016-08-04 NOTE — Progress Notes (Signed)
Cardiology Office Note   Date:  08/04/2016   ID:  [name] Smith, DOB 1965-11-22, MRN [id number]  PCP:  Lucky Cowboy, MD  Cardiologist:   Kristeen Miss, MD   Chief Complaint  Patient presents with  . Hypertension   Problem list 1. Palpitations 2. Hyperlipidemia 3. Cervical spine surgery - is on Lyrica for neuropathy pain .    History of Present Illness: [name] Smith  [facility] ) is a 51 y.o. female who presents for further evaluation of her palpitations.  ( Daughter of [name] )   Has noticed some palpitations on occasion.     Intermittent   It's intermittent and not constant. She's going through menopause. These palpitations have been occurring for the past couple of months. There is no associated syncope or presyncope. There is no chest pain or shortness breath.  Does not get regular exercise .  Just got a new puppy .   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   Feb. 13, 2018:  [name] Smith was seen several months ago for palpitations. She had an echocardiogram on 02/20/2015 which reveals normal left ventricular systolic function. She has grade 2 diastolic dysfunction.  Has been diagnosed with osteoperosis. She had stopped the hormone patch Her doctor wanted her to ask me about hormone replacement therapy  Aug 04, 2016:  Echo showed normal LV systolic function. Grade 2 diastolic dysfunction,  No MVP We Try Toprol-XL 25 mg a day but this did not agree with her. She is now cutting the Toprol in half and is tolerating 12.5 kg today. Is not exercising    Past Medical History:  Diagnosis Date  . Allergy   . Anemia   . Arthritis    in hands  . DDD (degenerative disc disease)   . Headache   . Hyperlipidemia    diet controlled  . PONV (postoperative nausea and vomiting)   . Unspecified vitamin D deficiency     Past Surgical History:  Procedure Laterality Date  . ABDOMINAL HYSTERECTOMY    . ANTERIOR CERVICAL DECOMP/DISCECTOMY FUSION  03/29/2012   Procedure: ANTERIOR CERVICAL DECOMPRESSION/DISCECTOMY FUSION 1 LEVEL;  Surgeon: Temple Pacini, MD;  Location: [facility];  Service: Neurosurgery;  Laterality: Bilateral;  Cervical four-five anterior cervical discectomy with fusion   . CERVICAL DISC SURGERY    . CESAREAN SECTION    . DIAGNOSTIC LAPAROSCOPY    . TONSILLECTOMY       Current Outpatient Prescriptions  Medication Sig Dispense Refill  . ALPRAZolam (XANAX) 0.25 MG tablet TAKE 1 TABLET BY MOUTH 3 TIMES A DAY AS NEEDED FOR ANXIETY 90 tablet 0  . Biotin w/ Vitamins C & E (HAIR/SKIN/NAILS PO) Take 2 tablets by mouth daily.     Marland Kitchen CALCIUM PO Take 500 mg by mouth 2 (two) times daily.    . Cholecalciferol (VITAMIN D) 2000 UNITS CAPS Take 2,000 Units by mouth 3 (three) times daily.     . Cyanocobalamin (VITAMIN B-12 PO) Take 1,000 mcg by mouth daily.    Marland Kitchen HYDROcodone-acetaminophen (NORCO/VICODIN) 5-325 MG tablet Take 1 tablet by mouth every 6 (six) hours as needed for moderate pain.    . metoprolol succinate (TOPROL XL) 25 MG 24 hr tablet Take 1 tablet (25 mg total) by mouth daily. (Patient taking differently: Take 12.5 mg by mouth daily. ) 30 tablet 11  . pregabalin (LYRICA) 75 MG capsule Take 75 mg by mouth daily.     . propranolol (INDERAL) 10  MG tablet Take 1 tablet (10 mg total) by mouth 4 (four) times daily as needed. (Patient not taking: Reported on 08/04/2016) 60 tablet 11   No current facility-administered medications for this visit.     Allergies:   Erythromycin; Penicillins; Prednisone; and Tetracyclines & related   Social History:  The patient  reports that she quit smoking about 4 years ago. She quit after 3.00 years of use. She has never used smokeless tobacco. She reports that she does not drink alcohol or use drugs.   Family History:  The patient's family history includes Cancer in her father and maternal grandfather; Diabetes in her maternal aunt; Heart disease in her father; Hyperlipidemia in her father and mother;  Hypertension in her father and mother.    ROS:  Please see the history of present illness.    Review of Systems: Constitutional:  denies fever, chills, diaphoresis, appetite change and fatigue.  HEENT: denies photophobia, eye pain, redness, hearing loss, ear pain, congestion, sore throat, rhinorrhea, sneezing, neck pain, neck stiffness and tinnitus.  Respiratory: denies SOB, DOE, cough, chest tightness, and wheezing.  Cardiovascular: denies chest pain, palpitations and leg swelling.  Gastrointestinal: denies nausea, vomiting, abdominal pain, diarrhea, constipation, blood in stool.  Genitourinary: denies dysuria, urgency, frequency, hematuria, flank pain and difficulty urinating.  Musculoskeletal: denies  myalgias, back pain, joint swelling, arthralgias and gait problem.   Skin: denies pallor, rash and wound.  Neurological: denies dizziness, seizures, syncope, weakness, light-headedness, numbness and headaches.   Hematological: denies adenopathy, easy bruising, personal or family bleeding history.  Psychiatric/ Behavioral: denies suicidal ideation, mood changes, confusion, nervousness, sleep disturbance and agitation.       All other systems are reviewed and negative.    PHYSICAL EXAM: VS:  BP 94/64 (BP Location: Right Arm, Patient Position: Sitting, Cuff Size: Normal)   Pulse 74   Ht 5' 4.5" (1.638 m)   Wt 151 lb 12.8 oz (68.9 kg)   SpO2 97%   BMI 25.65 kg/m  , BMI Body mass index is 25.65 kg/m. GEN: Well nourished, well developed, in no acute distress  HEENT: normal  Neck: no JVD, carotid bruits, or masses Cardiac: RRR;  Mid systolic click  no murmurs heart,  no edema  Respiratory:  clear to auscultation bilaterally, normal work of breathing GI: soft, nontender, nondistended, + BS MS: no deformity or atrophy  Skin: warm and dry, no rash Neuro:  Strength and sensation are intact Psych: normal   EKG:  EKG is ordered today. The ekg ordered today demonstrates  NSR at 73.    Occasional PVCS    Recent Labs: 11/28/2015: ALT 6; BUN 13; Creat 0.69; Hemoglobin 12.4; Magnesium 2.0; Platelets 322; Potassium 4.0; Sodium 139; TSH 0.61    Lipid Panel    Component Value Date/Time   CHOL 182 11/28/2015 1506   TRIG 69 11/28/2015 1506   HDL 51 11/28/2015 1506   CHOLHDL 3.6 11/28/2015 1506   VLDL 14 11/28/2015 1506   LDLCALC 117 11/28/2015 1506      Wt Readings from Last 3 Encounters:  08/04/16 151 lb 12.8 oz (68.9 kg)  04/29/16 150 lb 12.8 oz (68.4 kg)  01/28/16 150 lb 12.8 oz (68.4 kg)      Other studies Reviewed: Additional studies/ records that were reviewed today include: . Review of the above records demonstrates:    ASSESSMENT AND PLAN:  1.  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  Holter monitor shows  occasional PVCs Doing well on Toprol-XL 12.5 mg a day. I've encouraged her to exercise on a regular basis.    Current medicines are reviewed at length with the patient today.  The patient does not have concerns regarding medicines.  Labs/ tests ordered today include:  No orders of the defined types were placed in this encounter.  Disposition:   FU with me in 12 months  Kristeen MissPhilip Nahser, MD  08/04/2016 2:40 PM    [facility] [address], [place], [place]  [address] Phone: [phone]; Fax: [phone]

## 2016-09-09 DIAGNOSIS — M9905 Segmental and somatic dysfunction of pelvic region: Secondary | ICD-10-CM | POA: Diagnosis not present

## 2016-09-09 DIAGNOSIS — M9903 Segmental and somatic dysfunction of lumbar region: Secondary | ICD-10-CM | POA: Diagnosis not present

## 2016-09-09 DIAGNOSIS — M9902 Segmental and somatic dysfunction of thoracic region: Secondary | ICD-10-CM | POA: Diagnosis not present

## 2016-09-26 DIAGNOSIS — M9902 Segmental and somatic dysfunction of thoracic region: Secondary | ICD-10-CM | POA: Diagnosis not present

## 2016-09-26 DIAGNOSIS — M9903 Segmental and somatic dysfunction of lumbar region: Secondary | ICD-10-CM | POA: Diagnosis not present

## 2016-09-26 DIAGNOSIS — M9905 Segmental and somatic dysfunction of pelvic region: Secondary | ICD-10-CM | POA: Diagnosis not present

## 2016-10-07 DIAGNOSIS — M545 Low back pain: Secondary | ICD-10-CM | POA: Diagnosis not present

## 2016-10-07 DIAGNOSIS — M533 Sacrococcygeal disorders, not elsewhere classified: Secondary | ICD-10-CM | POA: Diagnosis not present

## 2016-10-07 DIAGNOSIS — M546 Pain in thoracic spine: Secondary | ICD-10-CM | POA: Diagnosis not present

## 2016-10-15 DIAGNOSIS — M7742 Metatarsalgia, left foot: Secondary | ICD-10-CM | POA: Diagnosis not present

## 2016-11-07 DIAGNOSIS — M7742 Metatarsalgia, left foot: Secondary | ICD-10-CM | POA: Diagnosis not present

## 2016-12-17 ENCOUNTER — Ambulatory Visit (INDEPENDENT_AMBULATORY_CARE_PROVIDER_SITE_OTHER): Payer: 59 | Admitting: Physician Assistant

## 2016-12-17 ENCOUNTER — Encounter: Payer: Self-pay | Admitting: Physician Assistant

## 2016-12-17 VITALS — BP 118/72 | HR 77 | Temp 98.4°F | Resp 18 | Ht 65.0 in | Wt 147.2 lb

## 2016-12-17 DIAGNOSIS — I10 Essential (primary) hypertension: Secondary | ICD-10-CM

## 2016-12-17 DIAGNOSIS — F3341 Major depressive disorder, recurrent, in partial remission: Secondary | ICD-10-CM

## 2016-12-17 DIAGNOSIS — Z0001 Encounter for general adult medical examination with abnormal findings: Secondary | ICD-10-CM | POA: Diagnosis not present

## 2016-12-17 DIAGNOSIS — E559 Vitamin D deficiency, unspecified: Secondary | ICD-10-CM

## 2016-12-17 DIAGNOSIS — Z130 Encounter for screening for diseases of the blood and blood-forming organs and certain disorders involving the immune mechanism: Secondary | ICD-10-CM | POA: Diagnosis not present

## 2016-12-17 DIAGNOSIS — E785 Hyperlipidemia, unspecified: Secondary | ICD-10-CM

## 2016-12-17 DIAGNOSIS — R6889 Other general symptoms and signs: Secondary | ICD-10-CM

## 2016-12-17 DIAGNOSIS — R1011 Right upper quadrant pain: Secondary | ICD-10-CM

## 2016-12-17 DIAGNOSIS — Z79899 Other long term (current) drug therapy: Secondary | ICD-10-CM | POA: Diagnosis not present

## 2016-12-17 NOTE — Patient Instructions (Addendum)
Cologuard is an easy to use noninvasive colon cancer screening test based on the latest advances in stool DNA science.   Colon cancer is 3rd most diagnosed cancer and 2nd leading cause of death in both men and women 53 years of age and older despite being one of the most preventable and treatable cancers if found early.  4 of out 5 people diagnosed with colon cancer have NO prior family history.  When caught EARLY 90% of colon cancer is curable.   You have agreed to do a Cologuard screening and have declined a colonoscopy in spite of being explained the risks and benefits of the colonoscopy in detail, including cancer and death. Please understand that this is test not as sensitive or specific as a colonoscopy and you are still recommended to get a colonoscopy.   If you are NOT medicare please call your insurance company and give them these items to see if they will cover it: 1) CPT code, [phone] 2) Provider is Probation officer 3) [employer] NPI [id number] 4) [place] Tax ID [phone]  Out-of-pocket cost for Cologuard can range from $0 - $649 so please call  You will receive a short call from [place] support center at [employer], when you receive a call they will say they are from [place],  to confirm your mailing address and give you more information.  When they calll you, it will appear on the caller ID as "[employer]" or in some cases only this number will appear, [phone].   [employer] will ship your collection kit directly to you. You will collect a single stool sample in the privacy of your own home, no special preparation required. You will return the kit via [place] pre-paid shipping or pick-up, in the same box it arrived in. Then I will contact you to discuss your results after I receive them from the laboratory.   If you have any questions or concerns, Cologuard Customer Support Specialist are available 24 hours a  day, 7 days a week at [phone] or go to [email or web address].      Cholecystitis Cholecystitis is inflammation of the gallbladder. It is often called a gallbladder attack. The gallbladder is a pear-shaped organ that lies beneath the liver on the right side of the body. The gallbladder stores bile, which is a fluid that helps the body to digest fats. If bile builds up in your gallbladder, your gallbladder becomes inflamed. This condition may occur suddenly (be acute). Repeat episodes of acute cholecystitis or prolonged episodes may lead to a long-term (chronic) condition. Cholecystitis is serious and it requires treatment. What are the causes? The most common cause of this condition is gallstones. Gallstones can block the tube (duct) that carries bile out of your gallbladder. This causes bile to build up. Other causes of this condition include:  Damage to the gallbladder due to a decrease in blood flow.  Infections in the bile ducts.  Scars or kinks in the bile ducts.  Tumors in the liver, pancreas, or gallbladder.  What increases the risk? This condition is more likely to develop in:  People who have sickle cell disease.  People who take birth control pills or use estrogen.  People who have alcoholic liver disease.  People who have liver cirrhosis.  People who have their nutrition delivered through a vein (parenteral nutrition).  People who do not eat or drink (do fasting) for a long period of time.  People who are obese.  People who have rapid weight loss.  People who are pregnant.  People who have increased triglyceride levels.  People who have pancreatitis.  What are the signs or symptoms? Symptoms of this condition include:  Abdominal pain, especially in the upper right area of the abdomen.  Abdominal tenderness or bloating.  Nausea.  Vomiting.  Fever.  Chills.  Yellowing of the skin and the whites of the eyes (jaundice).  How is this  diagnosed? This condition is diagnosed with a medical history and physical exam. You may also have other tests, including:  Imaging tests, such as: ? An ultrasound of the gallbladder. ? A CT scan of the abdomen. ? A gallbladder nuclear scan (HIDA scan). This scan allows your health care provider to see the bile moving from your liver to your gallbladder and to your small intestine. ? MRI.  Blood tests, such as: ? A complete blood count, because the white blood cell count may be higher than normal. ? Liver function tests, because some levels may be higher than normal with certain types of gallstones.  How is this treated? Treatment may include:  Fasting for a certain amount of time.  IV fluids.  Medicine to treat pain or vomiting.  Antibiotic medicine.  Surgery to remove your gallbladder (cholecystectomy). This may happen immediately or at a later time.  Follow these instructions at home: Home care will depend on your treatment. In general:  Take over-the-counter and prescription medicines only as told by your health care provider.  If you were prescribed an antibiotic medicine, take it as told by your health care provider. Do not stop taking the antibiotic even if you start to feel better.  Follow instructions from your health care provider about what to eat or drink. When you are allowed to eat, avoid eating or drinking anything that triggers your symptoms.  Keep all follow-up visits as told by your health care provider. This is important.  Contact a health care provider if:  Your pain is not controlled with medicine.  You have a fever. Get help right away if:  Your pain moves to another part of your abdomen or to your back.  You continue to have symptoms or you develop new symptoms even with treatment. This information is not intended to replace advice given to you by your health care provider. Make sure you discuss any questions you have with your health care  provider. Document Released: 03/03/2005 Document Revised: 07/12/2015 Document Reviewed: 06/14/2014 Elsevier Interactive Patient Education  2017 [place] with black pepper extract is a great natural antiinflammatory that helps with arthritis and aches and pain. Can get from [employer] or any health food store. Need to take at least 888m twice a day with food, can cause heart burn.

## 2016-12-17 NOTE — Progress Notes (Signed)
Complete Physical  Assessment and Plan:  Hyperlipidemia -continue medications, check lipids, decrease fatty foods, increase activity.  - Lipid panel   Herniated nucleus pulposus with myelopathy, cervical Follow up Dr. [name]  Vitamin D deficiency - Vit D  25 hydroxy (rtn osteoporosis monitoring)   Depression remission Depression- continue medications, stress management techniques discussed, increase water, good sleep hygiene discussed, increase exercise, and increase veggies.  - Magnesium  Generalized anxiety disorder  stress management techniques discussed, increase water, good sleep hygiene discussed, increase exercise, and increase veggies.  - states unable to take any SSRI's or SNRI's, explained need to decrease use of xanax or will refer to psych due to use of opioids.   DDD (degenerative disc disease), cervical Continue follow up Dr. [name]   Osteopenia Osteopenia- continue Vit D and Ca, weight bearing exercises  Essential hypertension - continue medications, DASH diet, exercise and monitor at home. Call if greater than 130/80. - CBC with Differential/Platelet - BASIC METABOLIC PANEL WITH GFR - Hepatic function panel - TSH - Urinalysis, Routine w reflex microscopic (not at [facility])  Routine general medical examination at a health care facility 1 year repeat  Screening, anemia, deficiency, iron -     Iron,Total/Total Iron Binding Cap -     Vitamin B12  RUQ pain, + murphy's -     US Abdomen Complete; Future - check labs, if any worsening pain go to ER   Discussed med's effects and SE's. Screening labs and tests as requested with regular follow-up as recommended. Over 60 minutes of exam, counseling, chart review, and complex, high level critical decision making was performed this visit.   HPI 51 y.o. female  presents for a complete physical.  Her blood pressure has been controlled at home, today their BP is BP: 118/72  She has had several episodes of vomiting x 2  months, has had RUQ pain, worse with cheese, milk, mayo. She has GERD. No fever, chills.  She does not workout due to chronic pain of back/neck.  She denies chest pain, shortness of breath, dizziness.  She is not on cholesterol medication and denies myalgias. Her cholesterol is at goal. The cholesterol last visit was:   Lab Results  Component Value Date   CHOL 182 11/28/2015   HDL 51 11/28/2015   LDLCALC 117 11/28/2015   TRIG 69 11/28/2015   CHOLHDL 3.6 11/28/2015    Last A1C in the office was:  Lab Results  Component Value Date   HGBA1C 5.3 11/27/2014   Patient is on Vitamin D supplement, she is on 6000 IU daily.   Lab Results  Component Value Date   VD25OH 46 11/28/2015     Is on estrogen patch for osteopenia and menopause from Dr. Jennette Kettle, due DEXA 2017, on bASA.  Having increase stress, taking care of parents, dad with MI and taking care of mom.  She is having headaches daily, right eye and behind it. Has seen Dr. Eulah Pont in the past.  BMI is Body mass index is 24.5 kg/m., she is working on diet and exercise. Wt Readings from Last 3 Encounters:  12/17/16 147 lb 3.2 oz (66.8 kg)  08/04/16 151 lb 12.8 oz (68.9 kg)  04/29/16 150 lb 12.8 oz (68.4 kg)    Current Medications:  Current Outpatient Prescriptions on File Prior to Visit  Medication Sig  . ALPRAZolam (XANAX) 0.25 MG tablet TAKE 1 TABLET BY MOUTH 3 TIMES A DAY AS NEEDED FOR ANXIETY  . CALCIUM PO Take 500 mg by  mouth 2 (two) times daily.  . Cholecalciferol (VITAMIN D) 2000 UNITS CAPS Take 2,000 Units by mouth 3 (three) times daily.   . Cyanocobalamin (VITAMIN B-12 PO) Take 1,000 mcg by mouth daily.  Marland Kitchen HYDROcodone-acetaminophen (NORCO/VICODIN) 5-325 MG tablet Take 1 tablet by mouth every 6 (six) hours as needed for moderate pain.  . metoprolol succinate (TOPROL XL) 25 MG 24 hr tablet Take 1 tablet (25 mg total) by mouth daily. (Patient taking differently: Take 12.5 mg by mouth daily. )  . pregabalin (LYRICA) 75 MG capsule  Take 75 mg by mouth daily.    No current facility-administered medications on file prior to visit.     Health Maintenance:   Immunization History  Administered Date(s) Administered  . Tdap 10/22/2011   Tetanus: 2013 Pneumovax: N/A Prevnar 13: due age 66 Flu vaccine: declines Zostavax: N/A  Pap: 2017 get with Dr. Jennette Kettle  3DMGM:09/2015  gets with Dr. Jennette Kettle DEXA: 2017 Osteopenia Dr. Jennette Kettle Colonoscopy: going to check cologuard CXR 11/2015 MRI cervical 2015 MRI brain 2015 Sleep study 2017 Echo 02/2016  Eye: Dr. Emily Filbert   Patient Care Team: Lucky Cowboy, MD as PCP - General (Internal Medicine) Manning Charity, OD as Referring Physician (Optometry) Freddy Finner, MD as Consulting Physician (Obstetrics and Gynecology) Janalyn Harder, MD as Consulting Physician (Dermatology) Charna Elizabeth, MD as Consulting Physician (Gastroenterology)  Medical History:  Past Medical History:  Diagnosis Date  . Allergy   . Anemia   . Arthritis    in hands  . DDD (degenerative disc disease)   . Headache   . Hyperlipidemia    diet controlled  . PONV (postoperative nausea and vomiting)   . Unspecified vitamin D deficiency    Allergies Allergies  Allergen Reactions  . Erythromycin Hives  . Penicillins Hives  . Prednisone Other (See Comments)    pain  . Tetracyclines & Related Hives    SURGICAL HISTORY She  has a past surgical history that includes Diagnostic laparoscopy; Abdominal hysterectomy; Tonsillectomy; Cervical disc surgery; Anterior cervical decomp/discectomy fusion (03/29/2012); and Cesarean section. FAMILY HISTORY Her family history includes Atrial fibrillation in her mother; Cancer in her father and maternal grandfather; Diabetes in her father and maternal aunt; Heart disease in her father; Hyperlipidemia in her father and mother; Hypertension in her father and mother. SOCIAL HISTORY She  reports that she quit smoking about 5 years ago. She quit after 3.00 years of use. She has  never used smokeless tobacco. She reports that she does not drink alcohol or use drugs.  Review of Systems  Constitutional: Positive for malaise/fatigue. Negative for chills, diaphoresis, fever and weight loss.  HENT: Negative for congestion, ear discharge, ear pain, hearing loss, nosebleeds, sore throat and tinnitus.   Eyes: Negative for blurred vision, double vision, photophobia, pain, discharge and redness.  Respiratory: Negative.  Negative for stridor.   Cardiovascular: Negative.   Gastrointestinal: Positive for abdominal pain, diarrhea, nausea and vomiting.  Genitourinary: Negative.   Musculoskeletal: Positive for back pain. Negative for falls, joint pain, myalgias and neck pain.  Skin: Negative.   Neurological: Negative for dizziness, tingling, tremors, sensory change, speech change, focal weakness, seizures, loss of consciousness, weakness and headaches.  Psychiatric/Behavioral: Negative.     Physical Exam: Estimated body mass index is 24.5 kg/m as calculated from the following:   Height as of this encounter:  (1.651 m).   Weight as of this encounter: 147 lb 3.2 oz (66.8 kg). BP 118/72   Pulse 77   Temp 98.4  F (36.9 C)   Resp 18   Ht  (1.651 m)   Wt 147 lb 3.2 oz (66.8 kg)   SpO2 98%   BMI 24.50 kg/m  General Appearance: Well nourished, in no apparent distress. Eyes: PERRLA, EOMs, conjunctiva no swelling or erythema, normal fundi and vessels. Sinuses: No Frontal/maxillary tenderness ENT/Mouth: Ext aud canals clear, normal light reflex with TMs without erythema, bulging.  Good dentition. No erythema, swelling, or exudate on post pharynx. Tonsils not swollen or erythematous. Hearing normal.  Neck: Supple, thyroid with some nodules and possible very slight goiter. No bruits Respiratory: Respiratory effort normal, BS equal bilaterally without rales, rhonchi, wheezing or stridor. Cardio: RRR without murmurs, rubs or gallops. Brisk peripheral pulses without edema.   Chest: symmetric, with normal excursions and percussion. Breasts: defer Abdomen: Soft, +BS.+ RUQ tenderness, + murphy's, no guarding, hernias, masses, or organomegaly. .  Lymphatics: Non tender without lymphadenopathy.  Genitourinary: defer Musculoskeletal: Full ROM all peripheral extremities,5/5 strength, and normal gait. Skin: Warm, dry without rashes, lesions, ecchymosis.  Neuro: Cranial nerves intact, reflexes equal bilaterally. Normal muscle tone, no cerebellar symptoms. Sensation intact.  Psych: Awake and oriented X 3, normal affect, Insight and Judgment appropriate.   EKG: defer   Quentin Mulling 2:24 PM

## 2016-12-18 LAB — HEPATIC FUNCTION PANEL
AG Ratio: 1.8 (calc) (ref 1.0–2.5)
ALT: 7 U/L (ref 6–29)
AST: 12 U/L (ref 10–35)
Albumin: 4.5 g/dL (ref 3.6–5.1)
Alkaline phosphatase (APISO): 82 U/L (ref 33–130)
Bilirubin, Direct: 0.1 mg/dL (ref 0.0–0.2)
Globulin: 2.5 g/dL (calc) (ref 1.9–3.7)
Indirect Bilirubin: 0.4 mg/dL (calc) (ref 0.2–1.2)
Total Bilirubin: 0.5 mg/dL (ref 0.2–1.2)
Total Protein: 7 g/dL (ref 6.1–8.1)

## 2016-12-18 LAB — CBC WITH DIFFERENTIAL/PLATELET
Basophils Absolute: 69 cells/uL (ref 0–200)
Basophils Relative: 1.4 %
Eosinophils Absolute: 118 cells/uL (ref 15–500)
Eosinophils Relative: 2.4 %
HCT: 36.6 % (ref 35.0–45.0)
Hemoglobin: 12.2 g/dL (ref 11.7–15.5)
Lymphs Abs: 1558 cells/uL (ref 850–3900)
MCH: 29.3 pg (ref 27.0–33.0)
MCHC: 33.3 g/dL (ref 32.0–36.0)
MCV: 88 fL (ref 80.0–100.0)
MPV: 10.3 fL (ref 7.5–12.5)
Monocytes Relative: 6.5 %
Neutro Abs: 2837 cells/uL (ref 1500–7800)
Neutrophils Relative %: 57.9 %
Platelets: 314 10*3/uL (ref 140–400)
RBC: 4.16 10*6/uL (ref 3.80–5.10)
RDW: 12.6 % (ref 11.0–15.0)
Total Lymphocyte: 31.8 %
WBC mixed population: 319 cells/uL (ref 200–950)
WBC: 4.9 10*3/uL (ref 3.8–10.8)

## 2016-12-18 LAB — BASIC METABOLIC PANEL WITH GFR
BUN: 11 mg/dL (ref 7–25)
CO2: 30 mmol/L (ref 20–32)
Calcium: 10 mg/dL (ref 8.6–10.4)
Chloride: 104 mmol/L (ref 98–110)
Creat: 0.71 mg/dL (ref 0.50–1.05)
GFR, Est African American: 113 mL/min/{1.73_m2} (ref >=60)
GFR, Est Non African American: 97 mL/min/{1.73_m2} (ref >=60)
Glucose, Bld: 101 mg/dL — ABNORMAL HIGH (ref 65–99)
Potassium: 4.2 mmol/L (ref 3.5–5.3)
Sodium: 142 mmol/L (ref 135–146)

## 2016-12-18 LAB — LIPID PANEL
Cholesterol: 174 mg/dL (ref <200)
HDL: 52 mg/dL (ref >50)
LDL Cholesterol (Calc): 107 mg/dL (calc) — ABNORMAL HIGH
Non-HDL Cholesterol (Calc): 122 mg/dL (calc) (ref <130)
Total CHOL/HDL Ratio: 3.3 (calc) (ref <5.0)
Triglycerides: 68 mg/dL (ref <150)

## 2016-12-18 LAB — URINALYSIS, ROUTINE W REFLEX MICROSCOPIC
Bilirubin Urine: NEGATIVE
Glucose, UA: NEGATIVE
Hgb urine dipstick: NEGATIVE
Leukocytes, UA: NEGATIVE
Nitrite: NEGATIVE
Protein, ur: NEGATIVE
Specific Gravity, Urine: 1.025 (ref 1.001–1.03)
pH: 5.5 (ref 5.0–8.0)

## 2016-12-18 LAB — VITAMIN D 25 HYDROXY (VIT D DEFICIENCY, FRACTURES): Vit D, 25-Hydroxy: 61 ng/mL (ref 30–100)

## 2016-12-18 LAB — TSH: TSH: 0.55 mIU/L

## 2016-12-18 LAB — IRON, TOTAL/TOTAL IRON BINDING CAP
%SAT: 35 % (calc) (ref 11–50)
Iron: 109 ug/dL (ref 45–160)
TIBC: 311 mcg/dL (calc) (ref 250–450)

## 2016-12-18 LAB — VITAMIN B12: Vitamin B-12: 312 pg/mL (ref 200–1100)

## 2016-12-18 LAB — MAGNESIUM: Magnesium: 2.2 mg/dL (ref 1.5–2.5)

## 2016-12-18 NOTE — Progress Notes (Signed)
Pt aware of lab results & voiced understanding of those results.

## 2016-12-23 ENCOUNTER — Ambulatory Visit
Admission: RE | Admit: 2016-12-23 | Discharge: 2016-12-23 | Disposition: A | Discharge status: Home / Self Care | Payer: 59 | Source: Ambulatory Visit | Attending: Physician Assistant | Admitting: Physician Assistant

## 2016-12-23 DIAGNOSIS — R1011 Right upper quadrant pain: Secondary | ICD-10-CM

## 2016-12-26 NOTE — Progress Notes (Signed)
Pt aware of lab results & voiced understanding of those results. Pt reports the pain still after eating, has not had any more vomiting since visit. Pt agreed to HIDA scan.

## 2016-12-29 ENCOUNTER — Other Ambulatory Visit: Payer: Self-pay | Admitting: Physician Assistant

## 2016-12-29 DIAGNOSIS — R1011 Right upper quadrant pain: Secondary | ICD-10-CM

## 2016-12-29 DIAGNOSIS — R112 Nausea with vomiting, unspecified: Secondary | ICD-10-CM

## 2017-01-05 DIAGNOSIS — M546 Pain in thoracic spine: Secondary | ICD-10-CM | POA: Diagnosis not present

## 2017-01-05 DIAGNOSIS — M545 Low back pain: Secondary | ICD-10-CM | POA: Diagnosis not present

## 2017-01-05 DIAGNOSIS — M533 Sacrococcygeal disorders, not elsewhere classified: Secondary | ICD-10-CM | POA: Diagnosis not present

## 2017-01-07 ENCOUNTER — Ambulatory Visit (HOSPITAL_COMMUNITY)
Admission: RE | Admit: 2017-01-07 | Discharge: 2017-01-07 | Disposition: A | Discharge status: Home / Self Care | Payer: 59 | Source: Ambulatory Visit | Attending: Physician Assistant | Admitting: Physician Assistant

## 2017-01-07 DIAGNOSIS — R112 Nausea with vomiting, unspecified: Secondary | ICD-10-CM | POA: Insufficient documentation

## 2017-01-07 DIAGNOSIS — R1011 Right upper quadrant pain: Secondary | ICD-10-CM | POA: Insufficient documentation

## 2017-01-07 MED ORDER — TECHNETIUM TC 99M MEBROFENIN IV KIT
5.0000 | PACK | Freq: Once | INTRAVENOUS | Status: AC | PRN
  Administered 2017-01-07: 5 via INTRAVENOUS

## 2017-01-12 NOTE — Progress Notes (Signed)
Pt aware of lab results & voiced understanding of those results.

## 2017-02-26 ENCOUNTER — Ambulatory Visit: Payer: 59 | Admitting: Adult Health

## 2017-02-26 ENCOUNTER — Encounter: Payer: Self-pay | Admitting: Adult Health

## 2017-02-26 VITALS — BP 112/74 | HR 89 | Temp 97.9°F | Ht 65.0 in | Wt 149.0 lb

## 2017-02-26 DIAGNOSIS — J40 Bronchitis, not specified as acute or chronic: Secondary | ICD-10-CM

## 2017-02-26 DIAGNOSIS — R05 Cough: Secondary | ICD-10-CM

## 2017-02-26 DIAGNOSIS — R059 Cough, unspecified: Secondary | ICD-10-CM

## 2017-02-26 MED ORDER — PROMETHAZINE-DM 6.25-15 MG/5ML PO SYRP: 5.0000 mL | ORAL_SOLUTION | Freq: Four times a day (QID) | ORAL | 1 refills | Status: DC | PRN

## 2017-02-26 MED ORDER — BENZONATATE 100 MG PO CAPS: 200.0000 mg | ORAL_CAPSULE | Freq: Three times a day (TID) | ORAL | 0 refills | Status: DC | PRN

## 2017-02-26 MED ORDER — AZITHROMYCIN 250 MG PO TABS: ORAL_TABLET | ORAL | 1 refills | Status: AC

## 2017-02-26 NOTE — Progress Notes (Signed)
Assessment and Plan:  [name] Smith was seen today for uri.  Diagnoses and all orders for this visit:  Bronchitis Treating due to length of ongoing symptoms -     azithromycin (ZITHROMAX) 250 MG tablet; Take 2 tablets (500 mg) on  Day 1,  followed by 1 tablet (250 mg) once daily on Days 2 through 5. -     Information provided for other symptomatic support - discussed hygiene, hydration, immune support, use mask while coughing around father.  Suggested symptomatic OTC remedies. Nasal saline spray for congestion. Nasal steroids, allergy pill Follow up as needed.  Cough -     promethazine-dextromethorphan (PROMETHAZINE-DM) 6.25-15 MG/5ML syrup; Take 5 mLs by mouth 4 (four) times daily as needed for cough. -     benzonatate (TESSALON PERLES) 100 MG capsule; Take 2 capsules (200 mg total) by mouth 3 (three) times daily as needed for cough (Max: 600mg  per day).  Further disposition pending results of labs. Discussed med's effects and SE's.   Over 15 minutes of exam, counseling, chart review, and critical decision making was performed.   Future Appointments  Date Time Provider Department Center  12/24/2017  2:00 PM Quentin MullingCollier, Amanda, PA-C [facility] None    ------------------------------------------------------------------------------------------------------------------   HPI BP 112/74   Pulse 89   Temp 97.9 F (36.6 C)   Ht 5\' 5"  (1.651 m)   Wt 149 lb (67.6 kg)   SpO2 97%   BMI 24.79 kg/m   51 y.o.female presents for mild sore throat, cough, chest "tickle," frequent throat clearly, chest congestion, large amount of clear nasal discharge. She denies fever at home but has feeling "hot and cold" - highest temp was 99.4 - she has been taking OTC daytime/nighttime cold medication - has improved but then seems to be getting worse. Symptoms going on intermittently since November.   She has not received the flu vaccine this year. 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   She reports several family member with similar symptoms going around since November.   Past Medical History:  Diagnosis Date  . Allergy   . Anemia   . Arthritis    in hands  . DDD (degenerative disc disease)   . Headache   . Hyperlipidemia    diet controlled  . PONV (postoperative nausea and vomiting)   . Unspecified vitamin D deficiency      Allergies  Allergen Reactions  . Erythromycin Hives  . Penicillins Hives  . Prednisone Other (See Comments)    pain  . Tetracyclines & Related Hives    Current Outpatient Medications on File Prior to Visit  Medication Sig  . ALPRAZolam (XANAX) 0.25 MG tablet TAKE 1 TABLET BY MOUTH 3 TIMES A DAY AS NEEDED FOR ANXIETY  . CALCIUM PO Take 500 mg by mouth 2 (two) times daily.  . Cholecalciferol (VITAMIN D) 2000 UNITS CAPS Take 2,000 Units by mouth 3 (three) times daily.   . Cyanocobalamin (VITAMIN B-12 PO) Take 1,000 mcg by mouth daily.  Marland Kitchen. HYDROcodone-acetaminophen (NORCO/VICODIN) 5-325 MG tablet Take 1 tablet by mouth every 6 (six) hours as needed for moderate pain.  . metoprolol succinate (TOPROL XL) 25 MG 24 hr tablet Take 1 tablet (25 mg total) by mouth daily. (Patient taking differently: Take 12.5 mg by mouth daily. )  . pregabalin (LYRICA) 75 MG capsule Take 75 mg by mouth daily.    No current facility-administered medications on file prior to visit.  ROS: Review of Systems  Constitutional: Positive for chills, fever (Mild) and malaise/fatigue. Negative for diaphoresis.  HENT: Positive for congestion and sore throat. Negative for ear discharge, ear pain, hearing loss, sinus pain and tinnitus.   Eyes: Negative for blurred vision, pain, discharge and redness.  Respiratory: Positive for cough. Negative for hemoptysis, sputum production, shortness of breath, wheezing and stridor.   Cardiovascular: Negative for chest pain, palpitations and orthopnea.  Gastrointestinal: Negative for abdominal pain, diarrhea,  nausea and vomiting.  Genitourinary: Negative.   Musculoskeletal: Negative for joint pain.  Skin: Negative for rash.  Neurological: Positive for headaches (Intermittent, mild 4/10). Negative for dizziness, sensory change and weakness.  Endo/Heme/Allergies: Negative for environmental allergies.  Psychiatric/Behavioral: Negative.   All other systems reviewed and are negative.  Physical Exam:  BP 112/74   Pulse 89   Temp 97.9 F (36.6 C)   Ht 5\' 5"  (1.651 m)   Wt 149 lb (67.6 kg)   SpO2 97%   BMI 24.79 kg/m   General Appearance: Well nourished, in no apparent distress. Eyes: PERRLA, EOMs, conjunctiva no swelling or erythema Sinuses: No Frontal/maxillary tenderness ENT/Mouth: Ext aud canals clear, TMs without erythema, bulging. Post pharynx mildly injected,no swelling, or exudate.  Tonsils not swollen or erythematous. Hearing normal.  Neck: Supple.  Respiratory: Respiratory effort normal, BS coarse over bronchioles -  without rales, rhonchi, wheezing or stridor.  Cardio: RRR with no MRGs. Brisk peripheral pulses without edema.  Abdomen: Soft, + BS.  Non tender, no guarding, rebound, hernias, masses. Lymphatics: Non tender without lymphadenopathy.  Skin: Warm, dry without rashes, lesions, ecchymosis.  Psych: Awake and oriented X 3, normal affect, Insight and Judgment appropriate.     Dan MakerAshley C Prudie Guthridge, NP 4:19 PM [facility]

## 2017-02-26 NOTE — Patient Instructions (Signed)

## 2017-03-03 ENCOUNTER — Telehealth: Payer: Self-pay

## 2017-03-03 ENCOUNTER — Other Ambulatory Visit: Payer: Self-pay | Admitting: Adult Health

## 2017-03-03 DIAGNOSIS — R05 Cough: Secondary | ICD-10-CM

## 2017-03-03 DIAGNOSIS — R059 Cough, unspecified: Secondary | ICD-10-CM

## 2017-03-03 MED ORDER — PROMETHAZINE-CODEINE 6.25-10 MG/5ML PO SYRP: 5.0000 mL | ORAL_SOLUTION | Freq: Four times a day (QID) | ORAL | 0 refills | Status: DC | PRN

## 2017-03-03 NOTE — Telephone Encounter (Signed)
Patient was seen last week for bronchitis and was given cough meds and it's not working at all. Coughing so much she is throwing up with it. Please advise.

## 2017-03-03 NOTE — Telephone Encounter (Signed)
Will send in codeine cough syrup.

## 2017-03-24 DIAGNOSIS — H10012 Acute follicular conjunctivitis, left eye: Secondary | ICD-10-CM | POA: Diagnosis not present

## 2017-03-27 DIAGNOSIS — H10012 Acute follicular conjunctivitis, left eye: Secondary | ICD-10-CM | POA: Diagnosis not present

## 2017-04-02 DIAGNOSIS — M5416 Radiculopathy, lumbar region: Secondary | ICD-10-CM | POA: Diagnosis not present

## 2017-04-02 DIAGNOSIS — M545 Low back pain: Secondary | ICD-10-CM | POA: Diagnosis not present

## 2017-04-14 DIAGNOSIS — B0233 Zoster keratitis: Secondary | ICD-10-CM | POA: Diagnosis not present

## 2017-04-17 DIAGNOSIS — B0233 Zoster keratitis: Secondary | ICD-10-CM | POA: Diagnosis not present

## 2017-04-27 DIAGNOSIS — B0233 Zoster keratitis: Secondary | ICD-10-CM | POA: Diagnosis not present

## 2017-04-29 DIAGNOSIS — R030 Elevated blood-pressure reading, without diagnosis of hypertension: Secondary | ICD-10-CM | POA: Diagnosis not present

## 2017-04-29 DIAGNOSIS — M5416 Radiculopathy, lumbar region: Secondary | ICD-10-CM | POA: Diagnosis not present

## 2017-04-29 DIAGNOSIS — M545 Low back pain: Secondary | ICD-10-CM | POA: Diagnosis not present

## 2017-05-12 DIAGNOSIS — H1013 Acute atopic conjunctivitis, bilateral: Secondary | ICD-10-CM | POA: Diagnosis not present

## 2017-05-25 ENCOUNTER — Other Ambulatory Visit: Payer: Self-pay | Admitting: Cardiovascular Disease

## 2017-06-01 DIAGNOSIS — M5137 Other intervertebral disc degeneration, lumbosacral region: Secondary | ICD-10-CM | POA: Diagnosis not present

## 2017-06-01 DIAGNOSIS — M9905 Segmental and somatic dysfunction of pelvic region: Secondary | ICD-10-CM | POA: Diagnosis not present

## 2017-06-01 DIAGNOSIS — M9903 Segmental and somatic dysfunction of lumbar region: Secondary | ICD-10-CM | POA: Diagnosis not present

## 2017-06-30 ENCOUNTER — Telehealth: Payer: Self-pay | Admitting: Cardiovascular Disease

## 2017-06-30 MED ORDER — METOPROLOL SUCCINATE ER 25 MG PO TB24: 12.5000 mg | ORAL_TABLET | Freq: Every day | ORAL | 0 refills | Status: DC

## 2017-06-30 NOTE — Telephone Encounter (Signed)
Should this be sent in for 12.5 mg or 25 mg qd? Please advise. Thanks, MI

## 2017-06-30 NOTE — Telephone Encounter (Signed)
New Message    *STAT* If patient is at the pharmacy, call can be transferred to refill team.   1. Which medications need to be refilled? (please list name of each medication and dose if known) metoprolol succinate (TOPROL-XL) 25 MG 24 hr tablet  2. Which pharmacy/location (including street and city if local pharmacy) is medication to be sent to? [facility] [place]  3. Do they need a 30 day or 90 day supply? 90

## 2017-06-30 NOTE — Telephone Encounter (Signed)
Dr. Harvie BridgeNahser's note from 5/18 states patient had decreased her Toprol to 12.5 mg daily.  Medication list has been updated

## 2017-07-14 ENCOUNTER — Encounter: Payer: Self-pay | Admitting: Cardiovascular Disease

## 2017-07-14 ENCOUNTER — Ambulatory Visit: Payer: 59 | Admitting: Physician Assistant

## 2017-07-14 VITALS — BP 120/82 | HR 82 | Temp 97.7°F | Resp 14 | Ht 65.0 in | Wt 157.2 lb

## 2017-07-14 DIAGNOSIS — Z79899 Other long term (current) drug therapy: Secondary | ICD-10-CM

## 2017-07-14 DIAGNOSIS — E559 Vitamin D deficiency, unspecified: Secondary | ICD-10-CM

## 2017-07-14 DIAGNOSIS — F3341 Major depressive disorder, recurrent, in partial remission: Secondary | ICD-10-CM | POA: Diagnosis not present

## 2017-07-14 DIAGNOSIS — E041 Nontoxic single thyroid nodule: Secondary | ICD-10-CM

## 2017-07-14 DIAGNOSIS — R5383 Other fatigue: Secondary | ICD-10-CM | POA: Diagnosis not present

## 2017-07-14 LAB — CBC WITH DIFFERENTIAL/PLATELET
Basophils Absolute: 69 cells/uL (ref 0–200)
Basophils Relative: 1.4 %
Eosinophils Absolute: 211 cells/uL (ref 15–500)
Eosinophils Relative: 4.3 %
HCT: 35.8 % (ref 35.0–45.0)
Hemoglobin: 12.3 g/dL (ref 11.7–15.5)
Lymphs Abs: 1681 cells/uL (ref 850–3900)
MCH: 29.1 pg (ref 27.0–33.0)
MCHC: 34.4 g/dL (ref 32.0–36.0)
MCV: 84.6 fL (ref 80.0–100.0)
MPV: 9.8 fL (ref 7.5–12.5)
Monocytes Relative: 8.4 %
Neutro Abs: 2528 cells/uL (ref 1500–7800)
Neutrophils Relative %: 51.6 %
Platelets: 314 10*3/uL (ref 140–400)
RBC: 4.23 10*6/uL (ref 3.80–5.10)
RDW: 12.6 % (ref 11.0–15.0)
Total Lymphocyte: 34.3 %
WBC mixed population: 412 cells/uL (ref 200–950)
WBC: 4.9 10*3/uL (ref 3.8–10.8)

## 2017-07-14 LAB — TSH: TSH: 0.78 mIU/L

## 2017-07-14 LAB — SEDIMENTATION RATE: Sed Rate: 11 mm/h (ref 0–30)

## 2017-07-14 NOTE — Progress Notes (Signed)
Subjective:    Patient ID: [name] Smith, female    DOB: April 17, 1965, 52 y.o.   MRN: [id number]  HPI 51 y.o. WF presents with fatigue and weight gain concerns.  She takes care of her parents, has some stress, has gained weight. She is craving sweets. She has fatigue. She takes xanax sparingly. Her son is in the army and taking care of her family.  She has been trying to bike 3 days a week, no SOB, CP.  She admits to not drinking enough water. Breakfast is toast/granola bar, sometimes she eats lunch, sandwich or burger, dinner is usual good.   She is on metoprolol for 1 year.  When she wakes up in the morning she does not feel refreshed and still feels tired. She has back pain and states this can keep her up at night. She follows with [facility], on lyrica and norco at night.   BMI is Body mass index is 26.16 kg/m., she is working on diet and exercise. She is up 8 lbs.  Wt Readings from Last 3 Encounters:  07/14/17 157 lb 3.2 oz (71.3 kg)  02/26/17 149 lb (67.6 kg)  12/17/16 147 lb 3.2 oz (66.8 kg)   Lab Results  Component Value Date   TSH 0.55 12/17/2016    Blood pressure 120/82, pulse 82, temperature 97.7 F (36.5 C), resp. rate 14, height  (1.651 m), weight 157 lb 3.2 oz (71.3 kg), SpO2 97 %.  Medications Current Outpatient Medications on File Prior to Visit  Medication Sig  . ALPRAZolam (XANAX) 0.25 MG tablet TAKE 1 TABLET BY MOUTH 3 TIMES A DAY AS NEEDED FOR ANXIETY  . CALCIUM PO Take 500 mg by mouth 2 (two) times daily.  . Cholecalciferol (VITAMIN D) 2000 UNITS CAPS Take 2,000 Units by mouth 3 (three) times daily.   . Cyanocobalamin (VITAMIN B-12 PO) Take 1,000 mcg by mouth daily.  Marland Kitchen HYDROcodone-acetaminophen (NORCO/VICODIN) 5-325 MG tablet Take 1 tablet by mouth every 6 (six) hours as needed for moderate pain.  . metoprolol succinate (TOPROL-XL) 25 MG 24 hr tablet Take 0.5 tablets (12.5 mg total) by mouth daily.  . pregabalin (LYRICA) 75 MG capsule Take  75 mg by mouth daily.    No current facility-administered medications on file prior to visit.     Problem list She has Herniated nucleus pulposus with myelopathy, cervical; Hyperlipidemia; Anemia; Vitamin D deficiency; DDD (degenerative disc disease), cervical; Depression; Generalized anxiety disorder; Osteopenia; Essential hypertension; Insomnia secondary to chronic pain; Spinal stenosis in cervical region; PVC's (premature ventricular contractions); and Mitral valve prolapse on their problem list.   Review of Systems  Constitutional: Positive for fatigue. Negative for activity change, appetite change, chills, diaphoresis, fever and unexpected weight change.  HENT: Negative.   Eyes: Negative.   Respiratory: Negative.   Cardiovascular: Negative.   Gastrointestinal: Negative.   Endocrine: Negative.  Negative for cold intolerance, heat intolerance, polydipsia, polyphagia and polyuria.  Genitourinary: Negative.   Musculoskeletal: Negative.   Neurological: Negative.   Hematological: Negative.   Psychiatric/Behavioral: Negative.        Objective:   Physical Exam  Constitutional: She is oriented to person, place, and time. She appears well-developed and well-nourished.  HENT:  Head: Normocephalic and atraumatic.  Right Ear: External ear normal.  Left Ear: External ear normal.  Mouth/Throat: Oropharynx is clear and moist.  Eyes: Pupils are equal, round, and reactive to light. Conjunctivae and EOM are normal.  Neck: Normal range of motion. Neck supple.  No thyromegaly present.  Cardiovascular: Normal rate, regular rhythm and normal heart sounds. Exam reveals no gallop and no friction rub.  No murmur heard. Pulmonary/Chest: Effort normal and breath sounds normal. No respiratory distress. She has no wheezes.  Abdominal: Soft. Bowel sounds are normal. She exhibits no distension and no mass. There is no tenderness. There is no rebound and no guarding.  Musculoskeletal: Normal range of  motion.  Lymphadenopathy:    She has no cervical adenopathy.  Neurological: She is alert and oriented to person, place, and time. She displays normal reflexes. No cranial nerve deficit. Coordination normal.  Skin: Skin is warm and dry.  Psychiatric: She has a normal mood and affect.       Assessment & Plan:    Recurrent major depressive disorder, in partial remission (HCC) May benefit from medication  Medication management -     CBC with Differential/Platelet -     COMPLETE METABOLIC PANEL WITH GFR -     Magnesium  Fatigue, unspecified type - Discussed lifestyle modification as means of resolving problem, Training modifications discussed, See orders for lab evaluation, Discussed how depression can be a cause of fatigue if all labs are negative will discuss depression treatment.  Need to cut back on carbs, increase protein -     TSH -     Iron,Total/Total Iron Binding Cap -     EKG 12-Lead -     Sedimentation rate  Thyroid nodule -     US THYROID; Future  Vitamin D deficiency -     VITAMIN D 25 Hydroxy (Vit-D Deficiency, Fractures)

## 2017-07-14 NOTE — Patient Instructions (Signed)
Check out  Mini habits for weight loss book  2 apps for tracking food is myfitness pal  loseit OR can take picture of your food     When it comes to diets, agreement about the perfect plan isn't easy to find, even among the experts. Experts at the [facility] of [employer] developed an idea known as the Healthy Eating Plate. Just imagine a plate divided into logical, healthy portions.  The emphasis is on diet quality:  Load up on vegetables and fruits - one-half of your plate: Aim for color and variety, and remember that potatoes don't count.  Go for whole grains - one-quarter of your plate: Whole wheat, barley, wheat berries, quinoa, oats, brown rice, and foods made with them. If you want pasta, go with whole wheat pasta.  Protein power - one-quarter of your plate: Fish, chicken, beans, and nuts are all healthy, versatile protein sources. Limit red meat.  The diet, however, does go beyond the plate, offering a few other suggestions.  Use healthy plant oils, such as olive, canola, soy, corn, sunflower and peanut. Check the labels, and avoid partially hydrogenated oil, which have unhealthy trans fats.  If you're thirsty, drink water. Coffee and tea are good in moderation, but skip sugary drinks and limit milk and dairy products to one or two daily servings.  The type of carbohydrate in the diet is more important than the amount. Some sources of carbohydrates, such as vegetables, fruits, whole grains, and beans-are healthier than others.  Finally, stay active.   Fatigue Fatigue is feeling tired all of the time, a lack of energy, or a lack of motivation. Occasional or mild fatigue is often a normal response to activity or life in general. However, long-lasting (chronic) or extreme fatigue may indicate an underlying medical condition. Follow these instructions at home: Watch your fatigue for any changes. The following actions may help to lessen any discomfort you are  feeling:  Talk to your health care provider about how much sleep you need each night. Try to get the required amount every night.  Take medicines only as directed by your health care provider.  Eat a healthy and nutritious diet. Ask your health care provider if you need help changing your diet.  Drink enough fluid to keep your urine clear or pale yellow.  Practice ways of relaxing, such as yoga, meditation, massage therapy, or acupuncture.  Exercise regularly.  Change situations that cause you stress. Try to keep your work and personal routine reasonable.  Do not abuse illegal drugs.  Limit alcohol intake to no more than 1 drink per day for nonpregnant women and 2 drinks per day for men. One drink equals 12 ounces of beer, 5 ounces of wine, or 1 ounces of hard liquor.  Take a multivitamin, if directed by your health care provider.  Contact a health care provider if:  Your fatigue does not get better.  You have a fever.  You have unintentional weight loss or gain.  You have headaches.  You have difficulty: ? Falling asleep. ? Sleeping throughout the night.  You feel angry, guilty, anxious, or sad.  You are unable to have a bowel movement (constipation).  You skin is dry.  Your legs or another part of your body is swollen. Get help right away if:  You feel confused.  Your vision is blurry.  You feel faint or pass out.  You have a severe headache.  You have severe abdominal, pelvic, or back pain.  You have chest pain, shortness of breath, or an irregular or fast heartbeat.  You are unable to urinate or you urinate less than normal.  You develop abnormal bleeding, such as bleeding from the rectum, vagina, nose, lungs, or nipples.  You vomit blood.  You have thoughts about harming yourself or committing suicide.  You are worried that you might harm someone else. This information is not intended to replace advice given to you by your health care provider.  Make sure you discuss any questions you have with your health care provider. Document Released: 12/29/2006 Document Revised: 08/09/2015 Document Reviewed: 07/05/2013 Elsevier Interactive Patient Education  [employer].

## 2017-07-15 LAB — COMPLETE METABOLIC PANEL WITH GFR
AG Ratio: 1.7 (calc) (ref 1.0–2.5)
ALT: 10 U/L (ref 6–29)
AST: 15 U/L (ref 10–35)
Albumin: 4.3 g/dL (ref 3.6–5.1)
Alkaline phosphatase (APISO): 84 U/L (ref 33–130)
BUN: 15 mg/dL (ref 7–25)
CO2: 30 mmol/L (ref 20–32)
Calcium: 9.6 mg/dL (ref 8.6–10.4)
Chloride: 105 mmol/L (ref 98–110)
Creat: 0.67 mg/dL (ref 0.50–1.05)
GFR, Est African American: 115 mL/min/{1.73_m2} (ref >=60)
GFR, Est Non African American: 100 mL/min/{1.73_m2} (ref >=60)
Globulin: 2.6 g/dL (calc) (ref 1.9–3.7)
Glucose, Bld: 98 mg/dL (ref 65–99)
Potassium: 4.2 mmol/L (ref 3.5–5.3)
Sodium: 142 mmol/L (ref 135–146)
Total Bilirubin: 0.4 mg/dL (ref 0.2–1.2)
Total Protein: 6.9 g/dL (ref 6.1–8.1)

## 2017-07-15 LAB — IRON, TOTAL/TOTAL IRON BINDING CAP
%SAT: 29 % (calc) (ref 11–50)
Iron: 89 ug/dL (ref 45–160)
TIBC: 307 mcg/dL (calc) (ref 250–450)

## 2017-07-15 LAB — VITAMIN D 25 HYDROXY (VIT D DEFICIENCY, FRACTURES): Vit D, 25-Hydroxy: 48 ng/mL (ref 30–100)

## 2017-07-15 LAB — MAGNESIUM: Magnesium: 2.1 mg/dL (ref 1.5–2.5)

## 2017-07-23 DIAGNOSIS — M5416 Radiculopathy, lumbar region: Secondary | ICD-10-CM | POA: Diagnosis not present

## 2017-07-23 DIAGNOSIS — M545 Low back pain: Secondary | ICD-10-CM | POA: Diagnosis not present

## 2017-07-24 ENCOUNTER — Ambulatory Visit
Admission: RE | Admit: 2017-07-24 | Discharge: 2017-07-24 | Disposition: A | Discharge status: Home / Self Care | Payer: 59 | Source: Ambulatory Visit | Attending: Physician Assistant | Admitting: Physician Assistant

## 2017-07-24 DIAGNOSIS — E041 Nontoxic single thyroid nodule: Secondary | ICD-10-CM

## 2017-07-31 ENCOUNTER — Ambulatory Visit (INDEPENDENT_AMBULATORY_CARE_PROVIDER_SITE_OTHER): Payer: 59 | Admitting: Cardiovascular Disease

## 2017-07-31 ENCOUNTER — Encounter: Payer: Self-pay | Admitting: Cardiovascular Disease

## 2017-07-31 VITALS — BP 110/72 | HR 76 | Ht 65.0 in | Wt 153.1 lb

## 2017-07-31 DIAGNOSIS — R002 Palpitations: Secondary | ICD-10-CM | POA: Diagnosis not present

## 2017-07-31 NOTE — Progress Notes (Signed)
Cardiology Office Note   Date:  07/31/2017   ID:  SAFARI CINQUE, DOB 1966/01/21, MRN [id number]  PCP:  Lucky Cowboy, MD  Cardiologist:   Kristeen Miss, MD   Chief Complaint  Patient presents with  . Follow-up    PVCs    Problem list 1. Palpitations 2. Hyperlipidemia 3. Cervical spine surgery - is on Lyrica for neuropathy pain .     [name] Smith  [facility] ) is a 52 y.o. female who presents for further evaluation of her palpitations.  ( Daughter of [name] )   Has noticed some palpitations on occasion.     Intermittent   It's intermittent and not constant. She's going through menopause. These palpitations have been occurring for the past couple of months. There is no associated syncope or presyncope. There is no chest pain or shortness breath.  Does not get regular exercise .  Just got a new puppy .   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   Feb. 13, 2018:  [name] Smith  was seen several months ago for palpitations. She had an echocardiogram on 02/20/2015 which reveals normal left ventricular systolic function. She has grade 2 diastolic dysfunction.  Has been diagnosed with osteoperosis. She had stopped the hormone patch Her doctor wanted her to ask me about hormone replacement therapy  Aug 04, 2016:  Echo showed normal LV systolic function. Grade 2 diastolic dysfunction,  No MVP We Try Toprol-XL 25 mg a day but this did not agree with her. She is now cutting the Toprol in half and is tolerating 12.5 kg today. Is not exercising   Jul 31, 2017: [name] is seen back today for follow up of her PVCs Still has fatigue ,   Has been found to have some thyroid nodules Has had some weight gain.   Is weaning off her pain meds.  Has lost 7 lbs in the past 2 weeks,   Has really cut her carbs  Is using a recumbant bike on occasion .    Past Medical History:  Diagnosis Date  . Allergy   . Anemia   . Arthritis    in hands  . DDD (degenerative disc disease)   .  Headache   . Hyperlipidemia    diet controlled  . PONV (postoperative nausea and vomiting)   . Unspecified vitamin D deficiency     Past Surgical History:  Procedure Laterality Date  . ABDOMINAL HYSTERECTOMY    . ANTERIOR CERVICAL DECOMP/DISCECTOMY FUSION  03/29/2012   Procedure: ANTERIOR CERVICAL DECOMPRESSION/DISCECTOMY FUSION 1 LEVEL;  Surgeon: Temple Pacini, MD;  Location: [facility];  Service: Neurosurgery;  Laterality: Bilateral;  Cervical four-five anterior cervical discectomy with fusion   . CERVICAL DISC SURGERY    . CESAREAN SECTION    . DIAGNOSTIC LAPAROSCOPY    . TONSILLECTOMY       Current Outpatient Medications  Medication Sig Dispense Refill  . ALPRAZolam (XANAX) 0.25 MG tablet TAKE 1 TABLET BY MOUTH 3 TIMES A DAY AS NEEDED FOR ANXIETY 90 tablet 0  . CALCIUM PO Take 500 mg by mouth 2 (two) times daily.    . Cholecalciferol (VITAMIN D) 2000 UNITS CAPS Take 2,000 Units by mouth 3 (three) times daily.     . Cyanocobalamin (VITAMIN B-12 PO) Take 1,000 mcg by mouth daily.    Marland Kitchen HYDROcodone-acetaminophen (NORCO/VICODIN) 5-325 MG tablet Take 1 tablet by mouth every 6 (six) hours as needed for moderate pain.    Marland Kitchen  LYRICA 50 MG capsule TAKE 1 CAPSULE BY MOUTH ONCE DAILY FOR 7 DAYS, THEN 1 EVERY OTHER DAY FOR 7 DAYS, THEN STOP  0  . metoprolol succinate (TOPROL-XL) 25 MG 24 hr tablet Take 0.5 tablets (12.5 mg total) by mouth daily. 30 tablet 0   No current facility-administered medications for this visit.     Allergies:   Erythromycin; Penicillins; Prednisone; and Tetracyclines & related   Social History:  The patient  reports that she quit smoking about 5 years ago. She quit after 3.00 years of use. She has never used smokeless tobacco. She reports that she does not drink alcohol or use drugs.   Family History:  The patient's family history includes Atrial fibrillation in her mother; Cancer in her father and maternal grandfather; Diabetes in her father and maternal aunt;  Heart disease in her father; Hyperlipidemia in her father and mother; Hypertension in her father and mother.    ROS: Noted in current history, otherwise review of systems is negative.   Physical Exam: Blood pressure 110/72, pulse 76, height  (1.651 m), weight 153 lb 1.9 oz (69.5 kg), SpO2 94 %.  GEN:  Well nourished, well developed in no acute distress HEENT: Normal NECK: No JVD; No carotid bruits LYMPHATICS: No lymphadenopathy CARDIAC: RRR,   Split S2  RESPIRATORY:  Clear to auscultation without rales, wheezing or rhonchi  ABDOMEN: Soft, non-tender, non-distended MUSCULOSKELETAL:  No edema; No deformity  SKIN: Warm and dry NEUROLOGIC:  Alert and oriented x 3    EKG:    Recent Labs: 07/14/2017: ALT 10; BUN 15; Creat 0.67; Hemoglobin 12.3; Magnesium 2.1; Platelets 314; Potassium 4.2; Sodium 142; TSH 0.78    Lipid Panel    Component Value Date/Time   CHOL 174 12/17/2016 1421   TRIG 68 12/17/2016 1421   HDL 52 12/17/2016 1421   CHOLHDL 3.3 12/17/2016 1421   VLDL 14 11/28/2015 1506   LDLCALC 107 (H) 12/17/2016 1421      Wt Readings from Last 3 Encounters:  07/31/17 153 lb 1.9 oz (69.5 kg)  07/14/17 157 lb 3.2 oz (71.3 kg)  02/26/17 149 lb (67.6 kg)      Other studies Reviewed: Additional studies/ records that were reviewed today include: . Review of the above records demonstrates:    ASSESSMENT AND PLAN:  1.  Palpitations:   Well tolerated at this point .  Continue Toprol-XL 25 mg a day.    Current medicines are reviewed at length with the patient today.  The patient does not have concerns regarding medicines.  Labs/ tests ordered today include:  No orders of the defined types were placed in this encounter.  Disposition:   FU with me in 12 months  Kristeen Miss, MD  07/31/2017 11:28 AM    [facility] [address] [place], [place], [place]  [address] Phone: [phone]; Fax: [phone]

## 2017-07-31 NOTE — Patient Instructions (Signed)
Your physician recommends that you continue on your current medications as directed. Please refer to the Current Medication list given to you today.  Your physician wants you to follow-up in: 1 year with Dr. Nahser.  You will receive a reminder letter in the mail two months in advance. If you don't receive a letter, please call our office to schedule the follow-up appointment.  

## 2017-08-06 ENCOUNTER — Other Ambulatory Visit: Payer: Self-pay | Admitting: Physician Assistant

## 2017-09-06 ENCOUNTER — Other Ambulatory Visit: Payer: Self-pay | Admitting: Cardiovascular Disease

## 2017-10-28 DIAGNOSIS — M5416 Radiculopathy, lumbar region: Secondary | ICD-10-CM | POA: Diagnosis not present

## 2017-10-28 DIAGNOSIS — M546 Pain in thoracic spine: Secondary | ICD-10-CM | POA: Diagnosis not present

## 2017-10-28 DIAGNOSIS — M545 Low back pain: Secondary | ICD-10-CM | POA: Diagnosis not present

## 2017-12-24 ENCOUNTER — Encounter: Payer: Self-pay | Admitting: Physician Assistant

## 2017-12-24 ENCOUNTER — Ambulatory Visit: Payer: 59 | Admitting: Physician Assistant

## 2017-12-24 VITALS — BP 126/68 | HR 74 | Temp 98.2°F | Resp 16 | Ht 65.0 in | Wt 159.0 lb

## 2017-12-24 DIAGNOSIS — M4802 Spinal stenosis, cervical region: Secondary | ICD-10-CM

## 2017-12-24 DIAGNOSIS — M8580 Other specified disorders of bone density and structure, unspecified site: Secondary | ICD-10-CM

## 2017-12-24 DIAGNOSIS — M5030 Other cervical disc degeneration, unspecified cervical region: Secondary | ICD-10-CM

## 2017-12-24 DIAGNOSIS — I10 Essential (primary) hypertension: Secondary | ICD-10-CM

## 2017-12-24 DIAGNOSIS — Z0000 Encounter for general adult medical examination without abnormal findings: Secondary | ICD-10-CM

## 2017-12-24 DIAGNOSIS — I341 Nonrheumatic mitral (valve) prolapse: Secondary | ICD-10-CM

## 2017-12-24 DIAGNOSIS — E785 Hyperlipidemia, unspecified: Secondary | ICD-10-CM

## 2017-12-24 DIAGNOSIS — M722 Plantar fascial fibromatosis: Secondary | ICD-10-CM

## 2017-12-24 DIAGNOSIS — Z79899 Other long term (current) drug therapy: Secondary | ICD-10-CM | POA: Diagnosis not present

## 2017-12-24 DIAGNOSIS — I493 Ventricular premature depolarization: Secondary | ICD-10-CM

## 2017-12-24 DIAGNOSIS — F3341 Major depressive disorder, recurrent, in partial remission: Secondary | ICD-10-CM

## 2017-12-24 DIAGNOSIS — D649 Anemia, unspecified: Secondary | ICD-10-CM

## 2017-12-24 DIAGNOSIS — G4701 Insomnia due to medical condition: Secondary | ICD-10-CM

## 2017-12-24 DIAGNOSIS — E559 Vitamin D deficiency, unspecified: Secondary | ICD-10-CM

## 2017-12-24 DIAGNOSIS — G8929 Other chronic pain: Secondary | ICD-10-CM

## 2017-12-24 DIAGNOSIS — F411 Generalized anxiety disorder: Secondary | ICD-10-CM

## 2017-12-24 DIAGNOSIS — Z0001 Encounter for general adult medical examination with abnormal findings: Secondary | ICD-10-CM

## 2017-12-24 DIAGNOSIS — Z1211 Encounter for screening for malignant neoplasm of colon: Secondary | ICD-10-CM

## 2017-12-24 NOTE — Progress Notes (Signed)
Complete Physical  Assessment and Plan:  Hyperlipidemia -continue medications, check lipids, decrease fatty foods, increase activity.  - Lipid panel   Herniated nucleus pulposus with myelopathy, cervical Follow up Dr. [name]  Vitamin D deficiency - Vit D  25 hydroxy (rtn osteoporosis monitoring)   Depression remission Depression- continue medications, stress management techniques discussed, increase water, good sleep hygiene discussed, increase exercise, and increase veggies.  - Magnesium  Generalized anxiety disorder  stress management techniques discussed, increase water, good sleep hygiene discussed, increase exercise, and increase veggies.  - states unable to take any SSRI's or SNRI's,ONLY TAKING XANAX ONCE A WEEK OR TWO TIMES A WEEK- LONG DISCUSSION ABOUT RISK  DDD (degenerative disc disease), cervical Continue follow up Dr. [name]   Osteopenia Osteopenia- continue Vit D and Ca, weight bearing exercises  Essential hypertension - continue medications, DASH diet, exercise and monitor at home. Call if greater than 130/80. - CBC with Differential/Platelet - BASIC METABOLIC PANEL WITH GFR - Hepatic function panel - TSH - Urinalysis, Routine w reflex microscopic (not at [facility])  Routine general medical examination at a health care facility 1 year repeat  High risk medication use Patient is on opioids as well as benzos, due to new guidelines we discussed decreasing benzo use. Went into great detail and length that new studies show that unintential overdose was most likely to occur with concurrent benzo use, that opioids are better acute and short term pain management and long term recent studies show that patients on long term opioid use have worse outcomes and more complications than other medications.   Plantar Faciitis-  Conservative treatment, night time orthotics, arch support, RICE, NSAID, stretches given If not better will do injection of dexamethasone  Discussed med's  effects and SE's. Screening labs and tests as requested with regular follow-up as recommended. Over 60 minutes of exam, counseling, chart review, and complex, high level critical decision making was performed this visit.   HPI 52 y.o. female  presents for a complete physical.  She has nodules on her hands and she has bilateral feet pain x 4-5 months, worse in the AM, she will "hobble". She states lateral ankle and bottom of her feet. Better after she is up for a while, worse after she is on her feet for a long time, and worse first thing the morning. She will take hydrocodone at night for bed.    Her blood pressure has been controlled at home, today their BP is BP: 126/68  She does not workout due to chronic pain of back/neck.  She denies chest pain, shortness of breath, dizziness.  She follows with Dr. Melburn Popper for palpitations, saw him in May.  She is not on cholesterol medication and denies myalgias. Her cholesterol is at goal. The cholesterol last visit was:   Lab Results  Component Value Date   CHOL 174 12/17/2016   HDL 52 12/17/2016   LDLCALC 107 (H) 12/17/2016   TRIG 68 12/17/2016   CHOLHDL 3.3 12/17/2016    Last A1C in the office was:  Lab Results  Component Value Date   HGBA1C 5.3 11/27/2014   Patient is on Vitamin D supplement, she is on 6000 IU daily.   Lab Results  Component Value Date   VD25OH 48 07/14/2017     Following with Dr. Jennette Kettle, GYN, she is off the estrogen patch, due DEXA 2017.  Having increase stress, taking care of parents, dad with MI and taking care of mom.  She follows with pain management and  is on hydrocodone for that.  She is on xanax and will take it once a week or two, mainly when she is dealing with there parents.  BMI is Body mass index is 26.46 kg/m., she is working on diet and exercise. Wt Readings from Last 3 Encounters:  12/24/17 159 lb (72.1 kg)  07/31/17 153 lb 1.9 oz (69.5 kg)  07/14/17 157 lb 3.2 oz (71.3 kg)    Current Medications:   Current Outpatient Medications on File Prior to Visit  Medication Sig  . ALPRAZolam (XANAX) 0.25 MG tablet Take 1/2 to 1 tablet 2 to 3 x / day ONLY if needed for Anxiety Attack & please try to limit to 5 days /week to avoid addiction  . CALCIUM PO Take 500 mg by mouth 2 (two) times daily.  . Cholecalciferol (VITAMIN D) 2000 UNITS CAPS Take 2,000 Units by mouth 3 (three) times daily.   . Cyanocobalamin (VITAMIN B-12 PO) Take 1,000 mcg by mouth daily.  Marland Kitchen HYDROcodone-acetaminophen (NORCO/VICODIN) 5-325 MG tablet Take 1 tablet by mouth every 6 (six) hours as needed for moderate pain.  . metoprolol succinate (TOPROL-XL) 25 MG 24 hr tablet TAKE 1/2 TABLET BY MOUTH DAILY   No current facility-administered medications on file prior to visit.     Health Maintenance:   Immunization History  Administered Date(s) Administered  . Tdap 10/22/2011   Tetanus: 2013 Pneumovax: N/A Prevnar 13: due age 33 Flu vaccine: declines Zostavax: N/A  Pap: 2017 get with Dr. Jennette Kettle  3DMGM:09/2016  gets with Dr. Jennette Kettle DEXA: 2017 Osteopenia Dr. Jennette Kettle due next year per patient Colonoscopy: going to check cologuard CXR 11/2015 MRI cervical 2015 MRI brain 2015 Sleep study 2017 Echo 02/2016  Eye: Dr. Emily Filbert   Patient Care Team: Lucky Cowboy, MD as PCP - General (Internal Medicine) Manning Charity, OD as Referring Physician (Optometry) Freddy Finner, MD as Consulting Physician (Obstetrics and Gynecology) Janalyn Harder, MD as Consulting Physician (Dermatology) Charna Elizabeth, MD as Consulting Physician (Gastroenterology)  Medical History:  Past Medical History:  Diagnosis Date  . Allergy   . Anemia   . Arthritis    in hands  . DDD (degenerative disc disease)   . Headache   . Hyperlipidemia    diet controlled  . PONV (postoperative nausea and vomiting)   . Unspecified vitamin D deficiency    Allergies Allergies  Allergen Reactions  . Erythromycin Hives  . Penicillins Hives  . Prednisone Other (See  Comments)    pain  . Tetracyclines & Related Hives    SURGICAL HISTORY She  has a past surgical history that includes Diagnostic laparoscopy; Abdominal hysterectomy; Tonsillectomy; Cervical disc surgery; Anterior cervical decomp/discectomy fusion (03/29/2012); and Cesarean section. FAMILY HISTORY Her family history includes Atrial fibrillation in her mother; Cancer in her father and maternal grandfather; Diabetes in her father and maternal aunt; Heart disease in her father; Hyperlipidemia in her father and mother; Hypertension in her father and mother. SOCIAL HISTORY She  reports that she quit smoking about 6 years ago. She quit after 3.00 years of use. She has never used smokeless tobacco. She reports that she does not drink alcohol or use drugs.  Review of Systems  Constitutional: Positive for malaise/fatigue. Negative for chills, diaphoresis, fever and weight loss.  HENT: Positive for tinnitus (bilateral ears, worse with lying down without any noise going, no dizziness/HA/decreased hearing. ). Negative for congestion, ear discharge, ear pain, hearing loss, nosebleeds and sore throat.   Eyes: Negative for blurred vision, double vision,  photophobia, pain, discharge and redness.  Respiratory: Negative.  Negative for stridor.   Cardiovascular: Negative.   Gastrointestinal: Negative for abdominal pain, diarrhea, nausea and vomiting.  Genitourinary: Negative.   Musculoskeletal: Positive for back pain and joint pain (bilateral feet). Negative for falls, myalgias and neck pain.  Skin: Negative.   Neurological: Negative for dizziness, tingling, tremors, sensory change, speech change, focal weakness, seizures, loss of consciousness, weakness and headaches.  Psychiatric/Behavioral: Negative.     Physical Exam: Estimated body mass index is 26.46 kg/m as calculated from the following:   Height as of this encounter: 5\' 5"  (1.651 m).   Weight as of this encounter: 159 lb (72.1 kg). BP 126/68    Pulse 74   Temp 98.2 F (36.8 C)   Resp 16   Ht 5\' 5"  (1.651 m)   Wt 159 lb (72.1 kg)   SpO2 97%   BMI 26.46 kg/m  General Appearance: Well nourished, in no apparent distress. Eyes: PERRLA, EOMs, conjunctiva no swelling or erythema, normal fundi and vessels. Sinuses: No Frontal/maxillary tenderness ENT/Mouth: Ext aud canals clear, normal light reflex with TMs without erythema, bulging.  Good dentition. No erythema, swelling, or exudate on post pharynx. Tonsils not swollen or erythematous. Hearing normal.  Neck: Supple, thyroid with some nodules and possible very slight goiter. No bruits Respiratory: Respiratory effort normal, BS equal bilaterally without rales, rhonchi, wheezing or stridor. Cardio: RRR without murmurs, rubs or gallops. Brisk peripheral pulses without edema.  Chest: symmetric, with normal excursions and percussion. Breasts: defer Abdomen: Soft, +BS, no guarding, hernias, masses, or organomegaly. .  Lymphatics: Non tender without lymphadenopathy.  Genitourinary: defer Musculoskeletal: Full ROM all peripheral extremities,5/5 strength, and normal gait. Skin: Warm, dry without rashes, lesions, ecchymosis.  Neuro: Cranial nerves intact, reflexes equal bilaterally. Normal muscle tone, no cerebellar symptoms. Sensation intact.  Psych: Awake and oriented X 3, normal affect, Insight and Judgment appropriate.   EKG: defer   Quentin Mulling 2:17 PM

## 2017-12-24 NOTE — Patient Instructions (Addendum)
COLOGUARD INFORMATION   Cologuard is an easy to use noninvasive colon cancer screening test based on the latest advances in stool DNA science.   Colon cancer is 3rd most diagnosed cancer and 2nd leading cause of death in both men and women 54 years of age and older despite being one of the most preventable and treatable cancers if found early.  4 of out 5 people diagnosed with colon cancer have NO prior family history.  When caught EARLY 90% of colon cancer is curable.   More than 92% of cologuard patients have NO out of pocket cost for screening however only your insurer can confirm how Cologuard would be covered for you. Cologuard has a team of specialist that can help you contact your insurer and ask the right questions. Please call [phone] so they can help.   You will receive a short call from [place] support center at [employer], when you receive a call they will say they are from [place],  to confirm your mailing address and give you more information.  When they calll you, it will appear on the caller ID as "[employer]" or in some cases only this number will appear, [phone].   [employer] will ship your collection kit directly to you. You will collect a single stool sample in the privacy of your own home, no special preparation required. You will return the kit via [facility] pre-paid shipping or pick-up, in the same box it arrived in. Then I will contact you to discuss your results after I receive them from the laboratory.   If you have any questions or concerns, Cologuard Customer Support Specialist are available 24 hours a day, 7 days a week at [phone] or go to [email or web address].   Do water bottle frozen and roll your foot on it Get inserts for your plantar fasciitis Do stretches at night If it is not better in 6 weeks we can do an injection Check out vionic shoes  Aleve is an antiinflammatory, can take 1 pill twice  a day for 2 weeks and then take as needed.  You can take tylenol (565m) or tylenol arthritis (6546m with the meloxicam/antiinflammatories. The max you can take of tylenol a day is 300019maily, this is a max of 6 pills a day of the regular tyelnol (500m17mr a max of 4 a day of the tylenol arthritis (650mg58m long as no other medications you are taking contain tylenol.   Aleve can cause inflammation in your stomach and can cause ulcers or bleeding, this will look like black tarry stools Make sure you take your aleve with food   Can take prilosec while taking aleve    Plantar Fasciitis  Plantar fasciitis is a painful foot condition that affects the heel. It occurs when the band of tissue that connects the toes to the heel bone (plantar fascia) becomes irritated. This can happen after exercising too much or doing other repetitive activities (overuse injury). The pain from plantar fasciitis can range from mild irritation to severe pain that makes it difficult for you to walk or move. The pain is usually worse in the morning or after you have been sitting or lying down for a while. What are the causes? This condition may be caused by:  Standing for long periods of time.  Wearing shoes that do not fit.  Doing high-impact activities, including running, aerobics, and ballet.  Being overweight.  Having an abnormal way of walking (gait).  Having tight calf muscles.  Having high arches in your feet.  Starting a new athletic activity.  What are the signs or symptoms? The main symptom of this condition is heel pain. Other symptoms include:  Pain that gets worse after activity or exercise.  Pain that is worse in the morning or after resting.  Pain that goes away after you walk for a few minutes.  How is this diagnosed? This condition may be diagnosed based on your signs and symptoms. Your health care provider will also do a physical exam to check for:  A tender area on the bottom  of your foot.  A high arch in your foot.  Pain when you move your foot.  Difficulty moving your foot.  You may also need to have imaging studies to confirm the diagnosis. These can include:  X-rays.  Ultrasound.  MRI.  How is this treated? Treatment for plantar fasciitis depends on the severity of the condition. Your treatment may include:  Rest, ice, and over-the-counter pain medicines to manage your pain.  Exercises to stretch your calves and your plantar fascia.  A splint that holds your foot in a stretched, upward position while you sleep (night splint).  Physical therapy to relieve symptoms and prevent problems in the future.  Cortisone injections to relieve severe pain.  Extracorporeal shock wave therapy (ESWT) to stimulate damaged plantar fascia with electrical impulses. It is often used as a last resort before surgery.  Surgery, if other treatments have not worked after 12 months.  Follow these instructions at home:  Take medicines only as directed by your health care provider.  Avoid activities that cause pain.  Roll the bottom of your foot over a bag of ice or a bottle of cold water. Do this for 20 minutes, 3-4 times a day.  Perform simple stretches as directed by your health care provider.  Try wearing athletic shoes with air-sole or gel-sole cushions or soft shoe inserts.  Wear a night splint while sleeping, if directed by your health care provider.  Keep all follow-up appointments with your health care provider. How is this prevented?  Do not perform exercises or activities that cause heel pain.  Consider finding low-impact activities if you continue to have problems.  Lose weight if you need to. The best way to prevent plantar fasciitis is to avoid the activities that aggravate your plantar fascia. Contact a health care provider if:  Your symptoms do not go away after treatment with home care measures.  Your pain gets worse.  Your pain  affects your ability to move or do your daily activities. This information is not intended to replace advice given to you by your health care provider. Make sure you discuss any questions you have with your health care provider. Document Released: 11/26/2000 Document Revised: 08/06/2015 Document Reviewed: 01/11/2014 Elsevier Interactive Patient Education  2018 [place] TO KNOW ABOUT OPIOID PAIN MEDICINES Opioids are strong prescription medications that are used to manage severe pain.  Opioids are better for acute and short term pain management and long term recent studies show that patients on long term opioid use have worse outcomes and more complications than other medications.   What are the serious risks of using opioids?  Too much opioid medication in your body can cause your breathing to STOP- which could lead to DEATH. This risk is greatest for people on other medications that make you sleepy like xanax, valium Ambien, or people with sleep  apnea.   You can get ADDICTED to opioids even though you take them exactly as prescribed, especially if you take the for a long time.   If you take an opioid medication for more than a few days your body becomes physically "dependent." This is normal and it means your body has gotten use to the medicine. You should taper off the opioid slowly to avoid withdrawal symptoms.   Addiction can happen to anyone. It is when you crave the drug because they make you feel good in some way. You keep taking the drug even thought you know it is not a good idea and bad things are happening to you. Addiction is a brain disease and may require ongoing treatment. Please talk with your healthcare provider right away if you think you might be addicted.   What should I avoid taking while I am taking opioids?  These medications listed below when taken with an opioid can increase the risk of you stopping breathing and the risk of death   Alcohol: do  not drink any kind of alcohol while taking opioids.  Benzodiazepines like valium or xanax  ** New study shows that unintential overdose was most likely to occur with concurrent benzo use  Muscle relaxants like Soma or Flexeril  Sleep medicines like Ambien or Lunesta  Other opioid medicines  How can I take opioid pain medicine safely?  Take your opioid medication as prescribed.   Do not cut, break, chew, crush or dissolve your medicine.   When your healthcare provider gives you the prescriptions, ask:  How long should I take it for?  What should I do to taper off the medication?  Call your healthcare provider if your opioid medicine is not controlling your pain. DO NOT increase the dose on your own.   Do not share or give you opioid medicine to anyone else.   Store your medicine in a safe place where it can not be reached by children or stolen by family or visitors to your home.   Keep track of the amount of medicine you have.   Do NOT operate heavy machinery or drive while taking opioid medications. They can make you sleepy, dizzy, or lightheaded.      Hiatal Hernia A hiatal hernia occurs when part of the stomach slides above the muscle that separates the abdomen from the chest (diaphragm). A person can be born with a hiatal hernia (congenital), or it may develop over time. In almost all cases of hiatal hernia, only the top part of the stomach pushes through the diaphragm. Many people have a hiatal hernia with no symptoms. The larger the hernia, the more likely it is that you will have symptoms. In some cases, a hiatal hernia allows stomach acid to flow back into the tube that carries food from your mouth to your stomach (esophagus). This may cause heartburn symptoms. Severe heartburn symptoms may mean that you have developed a condition called gastroesophageal reflux disease (GERD). What are the causes? This condition is caused by a weakness in the opening (hiatus) where the  esophagus passes through the diaphragm to attach to the upper part of the stomach. A person may be born with a weakness in the hiatus, or a weakness can develop over time. What increases the risk? This condition is more likely to develop in:  Older people. Age is a major risk factor for a hiatal hernia, especially if you are over the age of 50.  Pregnant women.  People  who are overweight.  People who have frequent constipation.  What are the signs or symptoms? Symptoms of this condition usually develop in the form of GERD symptoms. Symptoms include:  Heartburn.  Belching.  Indigestion.  Trouble swallowing.  Coughing or wheezing.  Sore throat.  Hoarseness.  Chest pain.  Nausea and vomiting.  How is this diagnosed? This condition may be diagnosed during testing for GERD. Tests that may be done include:  X-rays of your stomach or chest.  An upper gastrointestinal (GI) series. This is an X-ray exam of your GI tract that is taken after you swallow a chalky liquid that shows up clearly on the X-ray.  Endoscopy. This is a procedure to look into your stomach using a thin, flexible tube that has a tiny camera and light on the end of it.  How is this treated? This condition may be treated by:  Dietary and lifestyle changes to help reduce GERD symptoms.  Medicines. These may include: ? Over-the-counter antacids. ? Medicines that make your stomach empty more quickly. ? Medicines that block the production of stomach acid (H2 blockers). ? Stronger medicines to reduce stomach acid (proton pump inhibitors).  Surgery to repair the hernia, if other treatments are not helping.  If you have no symptoms, you may not need treatment. Follow these instructions at home: Lifestyle and activity  Do not use any products that contain nicotine or tobacco, such as cigarettes and e-cigarettes. If you need help quitting, ask your health care provider.  Try to achieve and maintain a  healthy body weight.  Avoid putting pressure on your abdomen. Anything that puts pressure on your abdomen increases the amount of acid that may be pushed up into your esophagus. ? Avoid bending over, especially after eating. ? Raise the head of your bed by putting blocks under the legs. This keeps your head and esophagus higher than your stomach. ? Do not wear tight clothing around your chest or stomach. ? Try not to strain when having a bowel movement, when urinating, or when lifting heavy objects. Eating and drinking  Avoid foods that can worsen GERD symptoms. These may include: ? Fatty foods, like fried foods. ? Citrus fruits, like oranges or lemon. ? Other foods and drinks that contain acid, like orange juice or tomatoes. ? Spicy food. ? Chocolate.  Eat frequent small meals instead of three large meals a day. This helps prevent your stomach from getting too full. ? Eat slowly. ? Do not lie down right after eating. ? Do not eat 1-2 hours before bed.  Do not drink beverages with caffeine. These include cola, coffee, cocoa, and tea.  Do not drink alcohol. General instructions  Take over-the-counter and prescription medicines only as told by your health care provider.  Keep all follow-up visits as told by your health care provider. This is important. Contact a health care provider if:  Your symptoms are not controlled with medicines or lifestyle changes.  You are having trouble swallowing.  You have coughing or wheezing that will not go away. Get help right away if:  Your pain is getting worse.  Your pain spreads to your arms, neck, jaw, teeth, or back.  You have shortness of breath.  You sweat for no reason.  You feel sick to your stomach (nauseous) or you vomit.  You vomit blood.  You have bright red blood in your stools.  You have black, tarry stools. This information is not intended to replace advice given to you by your  health care provider. Make sure you  discuss any questions you have with your health care provider. Document Released: 05/24/2003 Document Revised: 02/25/2016 Document Reviewed: 02/25/2016 Elsevier Interactive Patient Education  [employer].

## 2017-12-25 LAB — CBC WITH DIFFERENTIAL/PLATELET
Basophils Absolute: 67 cells/uL (ref 0–200)
Basophils Relative: 1.2 %
Eosinophils Absolute: 118 cells/uL (ref 15–500)
Eosinophils Relative: 2.1 %
HCT: 37.9 % (ref 35.0–45.0)
Hemoglobin: 12.7 g/dL (ref 11.7–15.5)
Lymphs Abs: 1887 cells/uL (ref 850–3900)
MCH: 28.9 pg (ref 27.0–33.0)
MCHC: 33.5 g/dL (ref 32.0–36.0)
MCV: 86.3 fL (ref 80.0–100.0)
MPV: 9.5 fL (ref 7.5–12.5)
Monocytes Relative: 5.9 %
Neutro Abs: 3198 cells/uL (ref 1500–7800)
Neutrophils Relative %: 57.1 %
Platelets: 360 10*3/uL (ref 140–400)
RBC: 4.39 10*6/uL (ref 3.80–5.10)
RDW: 12.4 % (ref 11.0–15.0)
Total Lymphocyte: 33.7 %
WBC mixed population: 330 cells/uL (ref 200–950)
WBC: 5.6 10*3/uL (ref 3.8–10.8)

## 2017-12-25 LAB — COMPLETE METABOLIC PANEL WITH GFR
AG Ratio: 1.7 (calc) (ref 1.0–2.5)
ALT: 12 U/L (ref 6–29)
AST: 13 U/L (ref 10–35)
Albumin: 4.5 g/dL (ref 3.6–5.1)
Alkaline phosphatase (APISO): 93 U/L (ref 33–130)
BUN: 11 mg/dL (ref 7–25)
CO2: 29 mmol/L (ref 20–32)
Calcium: 9.9 mg/dL (ref 8.6–10.4)
Chloride: 104 mmol/L (ref 98–110)
Creat: 0.69 mg/dL (ref 0.50–1.05)
GFR, Est African American: 114 mL/min/{1.73_m2} (ref >=60)
GFR, Est Non African American: 99 mL/min/{1.73_m2} (ref >=60)
Globulin: 2.6 g/dL (calc) (ref 1.9–3.7)
Glucose, Bld: 85 mg/dL (ref 65–99)
Potassium: 4.4 mmol/L (ref 3.5–5.3)
Sodium: 141 mmol/L (ref 135–146)
Total Bilirubin: 0.3 mg/dL (ref 0.2–1.2)
Total Protein: 7.1 g/dL (ref 6.1–8.1)

## 2017-12-25 LAB — MICROALBUMIN / CREATININE URINE RATIO
Creatinine, Urine: 164 mg/dL (ref 20–275)
Microalb Creat Ratio: 4 mcg/mg creat (ref <30)
Microalb, Ur: 0.6 mg/dL

## 2017-12-25 LAB — URINALYSIS, ROUTINE W REFLEX MICROSCOPIC
Bilirubin Urine: NEGATIVE
Glucose, UA: NEGATIVE
Hgb urine dipstick: NEGATIVE
Ketones, ur: NEGATIVE
Leukocytes, UA: NEGATIVE
Nitrite: NEGATIVE
Protein, ur: NEGATIVE
Specific Gravity, Urine: 1.019 (ref 1.001–1.03)
pH: 5.5 (ref 5.0–8.0)

## 2017-12-25 LAB — TSH: TSH: 0.81 mIU/L

## 2017-12-25 LAB — MAGNESIUM: Magnesium: 2 mg/dL (ref 1.5–2.5)

## 2017-12-25 LAB — LIPID PANEL
Cholesterol: 194 mg/dL (ref <200)
HDL: 54 mg/dL (ref >50)
LDL Cholesterol (Calc): 123 mg/dL (calc) — ABNORMAL HIGH
Non-HDL Cholesterol (Calc): 140 mg/dL (calc) — ABNORMAL HIGH (ref <130)
Total CHOL/HDL Ratio: 3.6 (calc) (ref <5.0)
Triglycerides: 76 mg/dL (ref <150)

## 2017-12-25 LAB — VITAMIN D 25 HYDROXY (VIT D DEFICIENCY, FRACTURES): Vit D, 25-Hydroxy: 43 ng/mL (ref 30–100)

## 2018-01-18 DIAGNOSIS — R030 Elevated blood-pressure reading, without diagnosis of hypertension: Secondary | ICD-10-CM | POA: Diagnosis not present

## 2018-01-18 DIAGNOSIS — M545 Low back pain: Secondary | ICD-10-CM | POA: Diagnosis not present

## 2018-01-18 DIAGNOSIS — M546 Pain in thoracic spine: Secondary | ICD-10-CM | POA: Diagnosis not present

## 2018-05-06 DIAGNOSIS — Z6826 Body mass index (BMI) 26.0-26.9, adult: Secondary | ICD-10-CM | POA: Diagnosis not present

## 2018-05-06 DIAGNOSIS — R030 Elevated blood-pressure reading, without diagnosis of hypertension: Secondary | ICD-10-CM | POA: Diagnosis not present

## 2018-05-06 DIAGNOSIS — M545 Low back pain: Secondary | ICD-10-CM | POA: Diagnosis not present

## 2018-05-13 DIAGNOSIS — H1013 Acute atopic conjunctivitis, bilateral: Secondary | ICD-10-CM | POA: Diagnosis not present

## 2018-05-17 IMAGING — NM NM HEPATO W/GB/PHARM/[PERSON_NAME]
3 series · 13 of 13 positions shown · non-contrast
Comparison: Abdominal ultrasound 12/23/2016

CLINICAL DATA: Right upper quadrant abdominal pain with nausea.
Symptoms for 3 months.

EXAM:
NUCLEAR MEDICINE HEPATOBILIARY IMAGING WITH GALLBLADDER EF
TECHNIQUE: Sequential images of the abdomen were obtained [DATE] minutes
following intravenous administration of radiopharmaceutical. After
oral ingestion of Ensure, gallbladder ejection fraction was
determined. At 60 min, normal ejection fraction is greater than 33%.
RADIOPHARMACEUTICALS:  5.1 mCi Kc-44m  Choletec IV

[he hepatobiliary · 3.10mm/px · 6 of 60 frames shown (1 of 3)]
[frame 6/60]
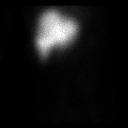
[frame 16/60]
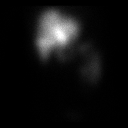
[frame 26/60]
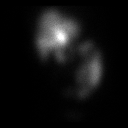
[frame 36/60]
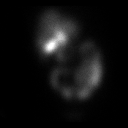
[frame 46/60]
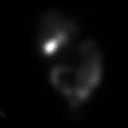
[frame 56/60]
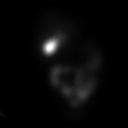

[he hepatobiliary · 2.26mm/px · 1 of 1 slices shown (2 of 3)]
[im 1/1]
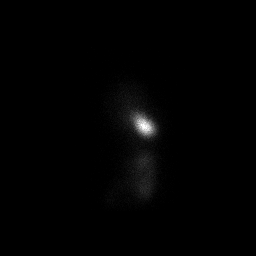

[he hepatobiliary · 3.10mm/px · 6 of 60 frames shown (3 of 3)]
[frame 6/60]
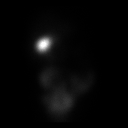
[frame 16/60]
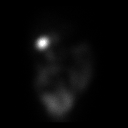
[frame 26/60]
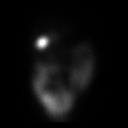
[frame 36/60]
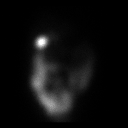
[frame 46/60]
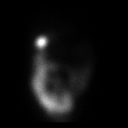
[frame 56/60]
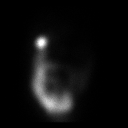

[13 of 13 positions shown; findings below may reference images not displayed]

FINDINGS: Prompt uptake and biliary excretion of activity by the liver is
seen. Gallbladder activity is visualized, consistent with patency of
cystic duct. Biliary activity passes into small bowel, consistent
with patent common bile duct.

Calculated gallbladder ejection fraction is 65%. (Normal gallbladder
ejection fraction with Ensure is greater than 33%.)
IMPRESSION: Normal examination. The cystic and common bile ducts are patent.
Normal gallbladder ejection fraction.

## 2018-07-01 ENCOUNTER — Ambulatory Visit: Payer: 59 | Admitting: Internal Medicine

## 2018-07-01 ENCOUNTER — Other Ambulatory Visit: Payer: Self-pay

## 2018-07-01 VITALS — Temp 97.0°F

## 2018-07-01 DIAGNOSIS — J0410 Acute tracheitis without obstruction: Secondary | ICD-10-CM

## 2018-07-01 NOTE — Progress Notes (Signed)
THIS ENCOUNTER IS A VIRTUAL VISIT DUE TO COVID-19 - PATIENT WAS NOT SEEN IN THE OFFICE.  PATIENT HAS CONSENTED TO VIRTUAL VISIT / TELEMEDICINE VISIT  Virtual Visit via telephone Note       I connected with the patient  on  07/01/2018  by telephone.  I verified that I am speaking with the correct person using two identifiers.    I discussed the limitations of evaluation and management by telemedicine and the availability of in person appointments. The patient expressed understanding and agreed to proceed.  History of Present Illness:       This is a very nice 53 yo MWF with hx/o HTN, HLD, PreDM & Vit D Deficiency calling with concerns of upper & lower respiratory congestion manifest with head >chest congestion, sl raspy hoarse voice, post nasal drainage, 'scratchy" S/T, dry non productive cough , some wheezing       Medications  .  metoprolol succinate (TOPROL-XL) 25 MG 24 hr tablet, TAKE 1/2 TABLET BY MOUTH DAILY .  HYDROcodone-acetaminophen (NORCO/VICODIN) 5-325 MG tablet, Take 1 tablet by mouth every 6 (six) hours as needed for moderate pain. .  Cyanocobalamin (VITAMIN B-12 PO), Take 1,000 mcg by mouth daily. Marland Kitchen  ALPRAZolam (XANAX) 0.25 MG tablet, Take 1/2 to 1 tablet 2 to 3 x / day ONLY if needed for Anxiety Attack & please try to limit to 5 days /week to avoid addiction .  Cholecalciferol (VITAMIN D) 2000 UNITS CAPS, Take 2,000 Units by mouth 3 (three) times daily.  Marland Kitchen  OVER THE COUNTER MEDICATION, Take 1 tablet by mouth daily. Zyrtec .  CALCIUM PO, Take 500 mg by mouth 2 (two) times daily.  Problem list She has Hyperlipidemia; Anemia; Vitamin D deficiency; DDD (degenerative disc disease), cervical; Depression; Generalized anxiety disorder; Osteopenia; Essential hypertension; Insomnia secondary to chronic pain; Spinal stenosis in cervical region; PVC's (premature ventricular contractions); and Mitral valve prolapse on their problem list.   Observations/Objective:  Temp (!) 97 F (36.1  C)   General : Well sounding patient in no apparent distress HEENT: slight hoarseness, cough sounds congested with dry expiratory wheezes Lungs: speaks in complete sentences,  no apparent distress Neurological: alert, oriented x 3 Psychiatric: pleasant, judgement appropriate   Assessment and Plan:  1. Tracheitis  - dexamethasone  0.5 MG tablet; Take 1 tab 3 x day - 3 days, then 2 x day - 3 days, then 1 tab daily  Dispense: 20 tablet  - azithromycin  250 MG tablet; Take 2 tablets  on  Day 1,  followed by 1 tablet once daily on Days 2 through 5  for Tracheitis ((ICD-10 = J04.10))  Dispense: 6 each; Refill: 1  - Discussed meds & SE's.                    m Follow Up Instructions:       I discussed the assessment and treatment plan with the patient. The patient was provided an opportunity to ask questions and all were answered. The patient agreed with the plan and demonstrated an understanding of the instructions.        The patient was advised to call back or seek an in-person evaluation if the symptoms worsen or if the condition fails to improve as anticipated.  I provided 18 minutes of non-face-to-face time during this encounter and over 24 minutes of  counseling, chart review and critical decision making was performed   Marinus Maw, MD

## 2018-07-03 ENCOUNTER — Encounter: Payer: Self-pay | Admitting: Internal Medicine

## 2018-07-03 MED ORDER — DEXAMETHASONE 0.5 MG PO TABS: ORAL_TABLET | ORAL | 0 refills | Status: DC

## 2018-07-03 MED ORDER — AZITHROMYCIN 250 MG PO TABS: ORAL_TABLET | ORAL | 1 refills | Status: DC

## 2018-08-12 DIAGNOSIS — M47816 Spondylosis without myelopathy or radiculopathy, lumbar region: Secondary | ICD-10-CM | POA: Diagnosis not present

## 2018-08-13 ENCOUNTER — Other Ambulatory Visit: Payer: Self-pay

## 2018-08-13 ENCOUNTER — Telehealth (INDEPENDENT_AMBULATORY_CARE_PROVIDER_SITE_OTHER): Payer: 59 | Admitting: Cardiovascular Disease

## 2018-08-13 VITALS — BP 111/65 | HR 76 | Ht 65.0 in | Wt 150.0 lb

## 2018-08-13 DIAGNOSIS — R002 Palpitations: Secondary | ICD-10-CM

## 2018-08-13 DIAGNOSIS — Z7189 Other specified counseling: Secondary | ICD-10-CM

## 2018-08-13 NOTE — Progress Notes (Signed)
Virtual Visit via Telephone Note   This visit type was conducted due to national recommendations for restrictions regarding the COVID-19 Pandemic (e.g. social distancing) in an effort to limit this patient's exposure and mitigate transmission in our community.  Due to her co-morbid illnesses, this patient is at least at moderate risk for complications without adequate follow up.  This format is felt to be most appropriate for this patient at this time.  The patient did not have access to video technology/had technical difficulties with video requiring transitioning to audio format only (telephone).  All issues noted in this document were discussed and addressed.  No physical exam could be performed with this format.  Please refer to the patient's chart for her  consent to [facility].   Date:  08/13/2018   ID:  [name] Smith, DOB 02/22/1966, MRN [id number]  Patient Location: Home Provider Location: Home  PCP:  Lucky CowboyMcKeown, William, MD  Cardiologist:   Nahser  Electrophysiologist:  None   Problem list 1. Palpitations 2. Hyperlipidemia 3. Cervical spine surgery - is on Lyrica for neuropathy pain .     [name] Smith  [facility]( [name] ) is a 53 y.o. female who presents for further evaluation of her palpitations.  ( Daughter of [name] Juniperom Price )   Has noticed some palpitations on occasion.     Intermittent   It's intermittent and not constant. She's going through menopause. These palpitations have been occurring for the past couple of months. There is no associated syncope or presyncope. There is no chest pain or shortness breath.  Does not get regular exercise .  Just got a new puppy .   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   Feb. 13, 2018:  [name] Cloudamela  was seen several months ago for palpitations. She had an echocardiogram on 02/20/2015 which reveals normal left ventricular systolic function. She has grade 2 diastolic dysfunction.  Has been diagnosed with  osteoperosis. She had stopped the hormone patch Her doctor wanted her to ask me about hormone replacement therapy  Aug 04, 2016:  Echo showed normal LV systolic function. Grade 2 diastolic dysfunction,  No MVP We Try Toprol-XL 25 mg a day but this did not agree with her. She is now cutting the Toprol in half and is tolerating 12.5 kg today. Is not exercising   Jul 31, 2017: [name] Cloudamela is seen back today for follow up of her PVCs Still has fatigue ,   Has been found to have some thyroid nodules Has had some weight gain.   Is weaning off her pain meds.  Has lost 7 lbs in the past 2 weeks,   Has really cut her carbs  Is using a recumbant bike on occasion .   Evaluation Performed:  Follow-Up Visit  Chief Complaint:  Palpitations   History of Present Illness:    [name] Smith is a 53 y.o. female with a history of chronic diastolic congestive heart failure.  She is had a history of palpitations.  Is doing well Has intense sweats in the am , associated with nausea.    Lasts for 5 minutes.   Feels palpitations during these episodes.  Not fast but pumping hard,  Stays hydrated ( gets up 1-2 times at night)  Is on toprol XL 12.5 mg at night   Is not exercising Was waking 1/2 mile a day but has not been recenlty  Has some lower back issues that keep her from walking .  Still has some palpitations.  We discussed V8 each day  She is not getting enough protein in her diet     The patient does not have symptoms concerning for COVID-19 infection (fever, chills, cough, or new shortness of breath).    Past Medical History:  Diagnosis Date  . Allergy   . Anemia   . Arthritis    in hands  . DDD (degenerative disc disease)   . Headache   . Hyperlipidemia    diet controlled  . PONV (postoperative nausea and vomiting)   . Unspecified vitamin D deficiency    Past Surgical History:  Procedure Laterality Date  . ABDOMINAL HYSTERECTOMY    . ANTERIOR CERVICAL DECOMP/DISCECTOMY  FUSION  03/29/2012   Procedure: ANTERIOR CERVICAL DECOMPRESSION/DISCECTOMY FUSION 1 LEVEL;  Surgeon: Temple Pacini, MD;  Location: [facility];  Service: Neurosurgery;  Laterality: Bilateral;  Cervical four-five anterior cervical discectomy with fusion   . CERVICAL DISC SURGERY    . CESAREAN SECTION    . DIAGNOSTIC LAPAROSCOPY    . TONSILLECTOMY       Current Meds  Medication Sig  . ALPRAZolam (XANAX) 0.25 MG tablet Take 1/2 to 1 tablet 2 to 3 x / day ONLY if needed for Anxiety Attack & please try to limit to 5 days /week to avoid addiction  . Cholecalciferol (VITAMIN D) 2000 UNITS CAPS Take 2,000 Units by mouth 3 (three) times daily.   . Cyanocobalamin 2500 MCG TABS Take by mouth.  Marland Kitchen HYDROcodone-acetaminophen (NORCO/VICODIN) 5-325 MG tablet Take 1 tablet by mouth every 6 (six) hours as needed for moderate pain.  . metoprolol succinate (TOPROL-XL) 25 MG 24 hr tablet TAKE 1/2 TABLET BY MOUTH DAILY  . OVER THE COUNTER MEDICATION Take 1 tablet by mouth daily. Zyrtec  . Turmeric 500 MG TABS Take 1,000 mg by mouth.     Allergies:   Erythromycin; Penicillins; Prednisone; and Tetracyclines & related   Social History   Tobacco Use  . Smoking status: Former Smoker    Years: 3.00    Last attempt to quit: 08/17/2011    Years since quitting: 6.9  . Smokeless tobacco: Never Used  Substance Use Topics  . Alcohol use: No  . Drug use: No     Family Hx: The patient's family history includes Atrial fibrillation in her mother; Cancer in her father and maternal grandfather; Diabetes in her father and maternal aunt; Heart disease in her father; Hyperlipidemia in her father and mother; Hypertension in her father and mother.  ROS:   Please see the history of present illness.     All other systems reviewed and are negative.   Prior CV studies:   The following studies were reviewed today:    Labs/Other Tests and Data Reviewed:    EKG:  No ECG reviewed.  Recent Labs: 12/24/2017: ALT 12; BUN  11; Creat 0.69; Hemoglobin 12.7; Magnesium 2.0; Platelets 360; Potassium 4.4; Sodium 141; TSH 0.81   Recent Lipid Panel Lab Results  Component Value Date/Time   CHOL 194 12/24/2017 02:42 PM   TRIG 76 12/24/2017 02:42 PM   HDL 54 12/24/2017 02:42 PM   CHOLHDL 3.6 12/24/2017 02:42 PM   LDLCALC 123 (H) 12/24/2017 02:42 PM    Wt Readings from Last 3 Encounters:  08/13/18 150 lb (68 kg)  12/24/17 159 lb (72.1 kg)  07/31/17 153 lb 1.9 oz (69.5 kg)     Objective:    Vital Signs:  BP 111/65 (BP Location: Right Arm, Patient  Position: Sitting, Cuff Size: Normal)   Pulse 76   Ht  (1.651 m)   Wt 150 lb (68 kg)   BMI 24.96 kg/m    No exam except for VS .   ASSESSMENT & PLAN:    1. Palpitations:   Has a hard heart beat in the early am hours.   HR is not fast.   Associated with sweats.   Discuss the fact that this may be normal physiology .   In reviewing her diet it is clear that she does not eat a lot of protein.  I I have encouraged her to drink V8 juice each day and to incorporate more protein in her diet.  I think this will help with some these palpitations.  We also discussed possibly increasing her Toprol-XL to 12.5 mg twice a day.  We will start with the dietary changes and we can add extra Toprol-XL if needed.  I will see her back in the office in 3 to 4 months for follow-up follow-up visit  COVID-19 Education: The signs and symptoms of COVID-19 were discussed with the patient and how to seek care for testing (follow up with PCP or arrange E-visit).  The importance of social distancing was discussed today.  Time:   Today, I have spent  19  minutes with the patient with telehealth technology discussing the above problems.     Medication Adjustments/Labs and Tests Ordered: Current medicines are reviewed at length with the patient today.  Concerns regarding medicines are outlined above.   Tests Ordered: No orders of the defined types were placed in this encounter.    Medication Changes: No orders of the defined types were placed in this encounter.   Disposition:  Follow up in 4 month(s)  Signed, Kristeen Miss, MD  08/13/2018 11:39 AM    [facility]

## 2018-08-13 NOTE — Patient Instructions (Signed)
Medication Instructions:  Your physician recommends that you continue on your current medications as directed. Please refer to the Current Medication list given to you today.  If you need a refill on your cardiac medications before your next appointment, please call your pharmacy.     Lab work: None Ordered    Testing/Procedures: None Ordered   Follow-Up: Your physician recommends that you have a follow-up appointment on Thursday September 3 at 2:20 pm with Dr. Elease Hashimoto We will plan for your visit to be in the office but will contact you closer to time with details   Increase your intake of fluids (water with electrolyte tabs like Nun tablets, or gatorade) , protein ( hard boiled eggs, chicken, fish) , and a electrolytes ( V-8 juice, salt, potassium chloride  which is sold as No-Salt

## 2018-09-15 ENCOUNTER — Other Ambulatory Visit: Payer: Self-pay | Admitting: Cardiovascular Disease

## 2018-11-17 NOTE — Progress Notes (Signed)
Cardiology Office Note   Date:  11/18/2018   ID:  [name] Smith, DOB 11/15/1965, MRN [id number]  PCP:  [name] CowboyMcKeown, William, MD  Cardiologist:   [name] MissPhilip Sharnise Blough, MD   Chief Complaint  Patient presents with  . Palpitations   Problem list 1. Palpitations 2. Hyperlipidemia 3. Cervical spine surgery - is on Lyrica for neuropathy pain .     [name] Smith  [facility]( [name] Smith ) is a 53 y.o. female who presents for further evaluation of her palpitations.  ( Daughter of [name] Smith )   Has noticed some palpitations on occasion.     Intermittent   It's intermittent and not constant. She's going through menopause. These palpitations have been occurring for the past couple of months. There is no associated syncope or presyncope. There is no chest pain or shortness breath.  Does not get regular exercise .  Just got a new puppy .   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   Feb. 13, 2018:  [name] Smith  was seen several months ago for palpitations. She had an echocardiogram on 02/20/2015 which reveals normal left ventricular systolic function. She has grade 2 diastolic dysfunction.  Has been diagnosed with osteoperosis. She had stopped the hormone patch Her doctor wanted her to ask me about hormone replacement therapy  Aug 04, 2016:  Echo showed normal LV systolic function. Grade 2 diastolic dysfunction,  No MVP We Try Toprol-XL 25 mg a day but this did not agree with her. She is now cutting the Toprol in half and is tolerating 12.5 kg today. Is not exercising   Jul 31, 2017: [name] Smith is seen back today for follow up of her PVCs Still has fatigue ,   Has been found to have some thyroid nodules Has had some weight gain.   Is weaning off her pain meds.  Has lost 7 lbs in the past 2 weeks,   Has really cut her carbs  Is using a recumbant bike on occasion .   Sept. 3, 2020  [name] Smith is seen to follow up for her PVCs.   Wt today is 150 lbs  We started Toprol XL last year.  Trying to work out .    Has had some plantar fasciatis. She has a mid systolic click on exam today   Past Medical History:  Diagnosis Date  . Allergy   . Anemia   . Arthritis    in hands  . DDD (degenerative disc disease)   . Headache   . Hyperlipidemia    diet controlled  . PONV (postoperative nausea and vomiting)   . Unspecified vitamin D deficiency     Past Surgical History:  Procedure Laterality Date  . ABDOMINAL HYSTERECTOMY    . ANTERIOR CERVICAL DECOMP/DISCECTOMY FUSION  03/29/2012   Procedure: ANTERIOR CERVICAL DECOMPRESSION/DISCECTOMY FUSION 1 LEVEL;  Surgeon: Temple PaciniHenry A Pool, MD;  Location: [facility];  Service: Neurosurgery;  Laterality: Bilateral;  Cervical four-five anterior cervical discectomy with fusion   . CERVICAL DISC SURGERY    . CESAREAN SECTION    . DIAGNOSTIC LAPAROSCOPY    . TONSILLECTOMY       Current Outpatient Medications  Medication Sig Dispense Refill  . ALPRAZolam (XANAX) 0.25 MG tablet Take 1/2 to 1 tablet 2 to 3 x / day ONLY if needed for Anxiety Attack & please try to limit to 5 days /week to avoid addiction 90 tablet 0  . Cholecalciferol (VITAMIN D) 2000 UNITS CAPS Take 2,000 Units  by mouth 3 (three) times daily.     . Cyanocobalamin 2500 MCG TABS Take by mouth.    Marland Kitchen HYDROcodone-acetaminophen (NORCO/VICODIN) 5-325 MG tablet Take 1 tablet by mouth every 6 (six) hours as needed for moderate pain.    . metoprolol succinate (TOPROL-XL) 25 MG 24 hr tablet TAKE 1/2 TABLET BY MOUTH DAILY 15 tablet 10  . OVER THE COUNTER MEDICATION Take 1 tablet by mouth daily. Zyrtec    . Turmeric 500 MG TABS Take 1,000 mg by mouth.     No current facility-administered medications for this visit.     Allergies:   Erythromycin, Penicillins, Prednisone, and Tetracyclines & related   Social History:  The patient  reports that she quit smoking about 7 years ago. She quit after 3.00 years of use. She has never used smokeless tobacco. She reports that she does not drink alcohol or use drugs.    Family History:  The patient's family history includes Atrial fibrillation in her mother; Cancer in her father and maternal grandfather; Diabetes in her father and maternal aunt; Heart disease in her father; Hyperlipidemia in her father and mother; Hypertension in her father and mother.    ROS: Noted in current history, otherwise review of systems is negative.   Physical Exam: Blood pressure 106/84, pulse 77, height 5\' 5"  (1.651 m), weight 150 lb 9.6 oz (68.3 kg), SpO2 98 %.  GEN:  Well nourished, well developed in no acute distress HEENT: Normal NECK: No JVD; No carotid bruits LYMPHATICS: No lymphadenopathy CARDIAC: RRR, mid systolic click , no significant murmur  RESPIRATORY:  Clear to auscultation without rales, wheezing or rhonchi  ABDOMEN: Soft, non-tender, non-distended MUSCULOSKELETAL:  No edema; No deformity  SKIN: Warm and dry NEUROLOGIC:  Alert and oriented x 3   EKG:    NSR at 77.  Normal ECG   Recent Labs: 12/24/2017: ALT 12; BUN 11; Creat 0.69; Hemoglobin 12.7; Magnesium 2.0; Platelets 360; Potassium 4.4; Sodium 141; TSH 0.81    Lipid Panel    Component Value Date/Time   CHOL 194 12/24/2017 1442   TRIG 76 12/24/2017 1442   HDL 54 12/24/2017 1442   CHOLHDL 3.6 12/24/2017 1442   VLDL 14 11/28/2015 1506   LDLCALC 123 (H) 12/24/2017 1442      Wt Readings from Last 3 Encounters:  11/18/18 150 lb 9.6 oz (68.3 kg)  08/13/18 150 lb (68 kg)  12/24/17 159 lb (72.1 kg)      Other studies Reviewed: Additional studies/ records that were reviewed today include: . Review of the above records demonstrates:    ASSESSMENT AND PLAN:  1.  Palpitations:   Doing well.   Much better controlled.   2.  mitral valve prolapse. :  Has a midsystolic  click on exam .  Trivial MR by echo   Will see in 1 year.     Current medicines are reviewed at length with the patient today.  The patient does not have concerns regarding medicines.  Labs/ tests ordered today include:    Orders Placed This Encounter  Procedures  . EKG 12-Lead   Disposition:   FU with me in 12 months  [name] Moores, MD  11/18/2018 5:32 PM    [name] Smith, [name], [place]  [address] Phone: [phone]; Fax: [phone]

## 2018-11-18 ENCOUNTER — Ambulatory Visit: Payer: 59 | Admitting: Cardiovascular Disease

## 2018-11-18 ENCOUNTER — Encounter: Payer: Self-pay | Admitting: Cardiovascular Disease

## 2018-11-18 ENCOUNTER — Other Ambulatory Visit: Payer: Self-pay

## 2018-11-18 VITALS — BP 106/84 | HR 77 | Ht 65.0 in | Wt 150.6 lb

## 2018-11-18 DIAGNOSIS — I493 Ventricular premature depolarization: Secondary | ICD-10-CM | POA: Diagnosis not present

## 2018-11-18 DIAGNOSIS — R002 Palpitations: Secondary | ICD-10-CM

## 2018-11-18 DIAGNOSIS — I341 Nonrheumatic mitral (valve) prolapse: Secondary | ICD-10-CM

## 2018-11-18 DIAGNOSIS — I10 Essential (primary) hypertension: Secondary | ICD-10-CM | POA: Diagnosis not present

## 2018-11-18 NOTE — Patient Instructions (Signed)
Medication Instructions:  Your physician recommends that you continue on your current medications as directed. Please refer to the Current Medication list given to you today.  If you need a refill on your cardiac medications before your next appointment, please call your pharmacy.    Lab work: None Ordered  If you have labs (blood work) drawn today and your tests are completely normal, you will receive your results only by: . MyChart Message (if you have MyChart) OR . A paper copy in the mail If you have any lab test that is abnormal or we need to change your treatment, we will call you to review the results.   Testing/Procedures: None Ordered   Follow-Up: At [facility], you and your health needs are our priority.  As part of our continuing mission to provide you with exceptional heart care, we have created designated Provider Care Teams.  These Care Teams include your primary Cardiologist (physician) and Advanced Practice Providers (APPs -  Physician Assistants and Nurse Practitioners) who all work together to provide you with the care you need, when you need it. You will need a follow up appointment in:  1 years.  Please call our office 2 months in advance to schedule this appointment.  You may see Philip Nahser, MD or one of the following Advanced Practice Providers on your designated Care Team: Scott Weaver, PA-C Vin Bhagat, PA-C . Janine Hammond, NP    

## 2018-11-25 ENCOUNTER — Ambulatory Visit (INDEPENDENT_AMBULATORY_CARE_PROVIDER_SITE_OTHER): Payer: 59 | Admitting: Podiatry

## 2018-11-25 ENCOUNTER — Other Ambulatory Visit: Payer: Self-pay | Admitting: Podiatry

## 2018-11-25 ENCOUNTER — Ambulatory Visit (INDEPENDENT_AMBULATORY_CARE_PROVIDER_SITE_OTHER): Payer: 59

## 2018-11-25 ENCOUNTER — Other Ambulatory Visit: Payer: Self-pay

## 2018-11-25 DIAGNOSIS — M79671 Pain in right foot: Secondary | ICD-10-CM

## 2018-11-25 DIAGNOSIS — G5761 Lesion of plantar nerve, right lower limb: Secondary | ICD-10-CM

## 2018-11-25 DIAGNOSIS — M722 Plantar fascial fibromatosis: Secondary | ICD-10-CM

## 2018-11-25 DIAGNOSIS — G5763 Lesion of plantar nerve, bilateral lower limbs: Secondary | ICD-10-CM

## 2018-11-25 DIAGNOSIS — G5762 Lesion of plantar nerve, left lower limb: Secondary | ICD-10-CM | POA: Diagnosis not present

## 2018-11-25 DIAGNOSIS — M79672 Pain in left foot: Secondary | ICD-10-CM

## 2018-11-25 NOTE — Patient Instructions (Signed)
Plantar Fasciitis Rehab Ask your health care provider which exercises are safe for you. Do exercises exactly as told by your health care provider and adjust them as directed. It is normal to feel mild stretching, pulling, tightness, or discomfort as you do these exercises. Stop right away if you feel sudden pain or your pain gets worse. Do not begin these exercises until told by your health care provider. Stretching and range-of-motion exercises These exercises warm up your muscles and joints and improve the movement and flexibility of your foot. These exercises also help to relieve pain. Plantar fascia stretch  1. Sit with your left / right leg crossed over your opposite knee. 2. Hold your heel with one hand with that thumb near your arch. With your other hand, hold your toes and gently pull them back toward the top of your foot. You should feel a stretch on the bottom of your toes or your foot (plantar fascia) or both. 3. Hold this stretch for__________ seconds. 4. Slowly release your toes and return to the starting position. Repeat __________ times. Complete this exercise __________ times a day. Gastrocnemius stretch, standing This exercise is also called a calf (gastroc) stretch. It stretches the muscles in the back of the upper calf. 1. Stand with your hands against a wall. 2. Extend your left / right leg behind you, and bend your front knee slightly. 3. Keeping your heels on the floor and your back knee straight, shift your weight toward the wall. Do not arch your back. You should feel a gentle stretch in your upper left / right calf. 4. Hold this position for __________ seconds. Repeat __________ times. Complete this exercise __________ times a day. Soleus stretch, standing This exercise is also called a calf (soleus) stretch. It stretches the muscles in the back of the lower calf. 1. Stand with your hands against a wall. 2. Extend your left / right leg behind you, and bend your front  knee slightly. 3. Keeping your heels on the floor, bend your back knee and shift your weight slightly over your back leg. You should feel a gentle stretch deep in your lower calf. 4. Hold this position for __________ seconds. Repeat __________ times. Complete this exercise __________ times a day. Gastroc and soleus stretch, standing step This exercise stretches the muscles in the back of the lower leg. These muscles are in the upper calf (gastrocnemius) and the lower calf (soleus). 1. Stand with the ball of your left / right foot on a step. The ball of your foot is on the walking surface, right under your toes. 2. Keep your other foot firmly on the same step. 3. Hold on to the wall or a railing for balance. 4. Slowly lift your other foot, allowing your body weight to press your left / right heel down over the edge of the step. You should feel a stretch in your left / right calf. 5. Hold this position for __________ seconds. 6. Return both feet to the step. 7. Repeat this exercise with a slight bend in your left / right knee. Repeat __________ times with your left / right knee straight and __________ times with your left / right knee bent. Complete this exercise __________ times a day. Balance exercise This exercise builds your balance and strength control of your arch to help take pressure off your plantar fascia. Single leg stand If this exercise is too easy, you can try it with your eyes closed or while standing on a pillow. 1.   Without shoes, stand near a railing or in a doorway. You may hold on to the railing or door frame as needed. 2. Stand on your left / right foot. Keep your big toe down on the floor and try to keep your arch lifted. Do not let your foot roll inward. 3. Hold this position for __________ seconds. Repeat __________ times. Complete this exercise __________ times a day. This information is not intended to replace advice given to you by your health care provider. Make sure  you discuss any questions you have with your health care provider. Document Released: 03/03/2005 Document Revised: 06/24/2018 Document Reviewed: 12/30/2017 Elsevier Patient Education  2020 [employer]  

## 2018-11-28 NOTE — Progress Notes (Signed)
  Subjective:  Patient ID: [name] Smith, female    DOB: 1965-10-21,  MRN: [id number]  Chief Complaint  Patient presents with  . Foot Pain    Pt states bilateral generalized 'all over' foot pain, 1 year duration. Pt states history of diagnosis with plantar fasciitis.   Patient presents with bilateral generalized foot pain, present for over one year.  Denies known injury.  States that she had a history of a left foot neuroma that has been injected before with relief of pain.  Objective:   Constitutional Well developed. Well nourished.  Orthopedic: Pain to palpation about the medial calcaneal tubercle bilaterally, decreased ankle range of motion bilaterally.  Hypermobile feet bilaterally with hypermobile lateral columns.  Pain to palpation about the third interspace bilaterally with positive Mulder's clicks.   Radiographs: Taken and reviewed, no acute fracture or dislocations.  No underlying osseous abnormalities. Assessment/Plan:  Patient was evaluated and treated and all questions answered.  1.  Plantar fasciitis bilaterally. - X-rays reviewed as above. - Injection delivered to the left plantar fascia. - Educated on the etiology.  Instructions given on stretching and icing of the affected limb. - Plantar fascial brace and night splint dispensed.  Educated on use.  2.  Morton's neuroma bilaterally. - Injection delivered to the third interspace bilaterally.  Educated on etiology.  Procedure: Neuroma Injection Location: Bilateral Bilateral third interspace. Skin Prep: Alcohol. Injectate: 0.5 cc 0.5% Marcaine plain, 0.5 cc dexamethasone phosphate. Disposition: Patient tolerated procedure well. Injection site dressed with a Band-Aid.  Procedure: Injection Tendon/Ligament Consent: Verbal consent obtained. Location: Left Left plantar fascia at the glabrous junction; medial approach. Skin Prep: Alcohol. Injectate: 1 cc 0.5% Marcaine plain, 1 cc dexamethasone phosphate, 0.5 cc Kenalog  10. Disposition: Patient tolerated procedure well. Injection site dressed with a band-aid.

## 2018-12-16 ENCOUNTER — Ambulatory Visit: Payer: 59 | Admitting: Podiatry

## 2018-12-16 ENCOUNTER — Other Ambulatory Visit: Payer: Self-pay

## 2018-12-16 DIAGNOSIS — G5763 Lesion of plantar nerve, bilateral lower limbs: Secondary | ICD-10-CM | POA: Diagnosis not present

## 2018-12-16 DIAGNOSIS — M722 Plantar fascial fibromatosis: Secondary | ICD-10-CM

## 2018-12-16 NOTE — Progress Notes (Signed)
  Subjective:  Patient ID: [name] Smith, female    DOB: [birth year],  MRN: [id number]  Chief Complaint  Patient presents with  . Neuroma    Pt states bilateral neuromas have improved with the injections, 80% improvement.  . Plantar Fasciitis    Pt states pain has greatly resolved in bilateral heels.   Patient presents for f/u of bilateral foot pain. Hx as above.  Objective:   Constitutional Well developed. Well nourished.  Orthopedic: Pain to palpation about the medial calcaneal tubercle bilaterally, decreased ankle range of motion bilaterally.  Hypermobile feet bilaterally with hypermobile lateral columns.  Pain to palpation about the third interspace bilaterally with positive Mulder's clicks.   Radiographs: None  Assessment/Plan:  Patient was evaluated and treated and all questions answered.  1.  Plantar fasciitis bilaterally; morton' neuroma bilat -Mild pain at neuroma left, otherwise doing well. -Defer injections at this time. -F/u PRN

## 2019-01-03 NOTE — Progress Notes (Signed)
Complete Physical  Assessment and Plan:  Hyperlipidemia -continue medications, check lipids, decrease fatty foods, increase activity.  - Lipid panel   Herniated nucleus pulposus with myelopathy, cervical Follow up Dr. Trenton Gammon  Vitamin D deficiency - Vit D  25 hydroxy (rtn osteoporosis monitoring)   Depression remission Depression- continue medications, stress management techniques discussed, increase water, good sleep hygiene discussed, increase exercise, and increase veggies   - Magnesium  Generalized anxiety disorder  stress management techniques discussed, increase water, good sleep hygiene discussed, increase exercise, and increase veggies.   DDD (degenerative disc disease), cervical Continue follow up Dr. Trenton Gammon   Osteopenia Osteopenia- continue Vit D and Ca, weight bearing exercises  Essential hypertension - continue medications, DASH diet, exercise and monitor at home. Call if greater than 130/80. - CBC with Differential/Platelet - BASIC METABOLIC PANEL WITH GFR - Hepatic function panel - TSH - Urinalysis, Routine w reflex microscopic (not at [facility])  Routine general medical examination at a health care facility 1 year repeat  High risk medication use Not taking benzos at this time.   Screen for colon cancer -     Cologuard  Migraine with aura and without status migrainosus, not intractable -     Rimegepant Sulfate (NURTEC) 75 MG TBDP; Take 75 mg by mouth as needed (migraine). - can not tolerate imitrex due to palpitations/heart issues, given sample and RX for nurtec    Discussed med's effects and SE's. Screening labs and tests as requested with regular follow-up as recommended. Over 60 minutes of exam, counseling, chart review, and complex, high level critical decision making was performed this visit.   HPI 53 y.o. female  presents for a complete physical.  She is under a lot of stress with her parents and she is getting migraine once a week. Will have  diarrhea and nausea with it. Frontal migraine, sensitive to light, occ with occular issues.  Migraines x 30+ years, has been to headache clinic.  She has never tried  CT head 2012 MRI brain 2015 Sleep study 2017- negative   Her blood pressure has been controlled at home, today their BP is BP: 124/72  She does not workout due to chronic pain of back/neck.  She denies chest pain, shortness of breath, dizziness.  BMI is Body mass index is 25.63 kg/m., she is working on diet and exercise. Wt Readings from Last 3 Encounters:  01/05/19 154 lb (69.9 kg)  11/18/18 150 lb 9.6 oz (68.3 kg)  08/13/18 150 lb (68 kg)   She follows with Dr. Cathie Olden for palpitations, saw him in sept.  She is not on cholesterol medication and denies myalgias. Her cholesterol is at goal. The cholesterol last visit was:   Lab Results  Component Value Date   CHOL 194 12/24/2017   HDL 54 12/24/2017   LDLCALC 123 (H) 12/24/2017   TRIG 76 12/24/2017   CHOLHDL 3.6 12/24/2017    Last A1C in the office was:  Lab Results  Component Value Date   HGBA1C 5.3 11/27/2014   Patient is on Vitamin D supplement, she is on 6000 IU daily.   Lab Results  Component Value Date   VD25OH 43 12/24/2017     Following with Dr. Nori Riis, [place], she is off the estrogen patch.  She follows with pain management and is on hydrocodone for that.  She is on xanax AS needed, very low dose 0.25mg , last refill was 08/09/2017 for 60 and she states she still has some.   Current Medications:  Current Outpatient Medications on File Prior to Visit  Medication Sig  . ALPRAZolam (XANAX) 0.25 MG tablet Take 1/2 to 1 tablet 2 to 3 x / day ONLY if needed for Anxiety Attack & please try to limit to 5 days /week to avoid addiction  . Cholecalciferol (VITAMIN D) 2000 UNITS CAPS Take 2,000 Units by mouth 3 (three) times daily.   . Cyanocobalamin 2500 MCG TABS Take by mouth.  Marland Kitchen HYDROcodone-acetaminophen (NORCO/VICODIN) 5-325 MG tablet Take 1 tablet by mouth every  6 (six) hours as needed for moderate pain.  . metoprolol succinate (TOPROL-XL) 25 MG 24 hr tablet TAKE 1/2 TABLET BY MOUTH DAILY  . OVER THE COUNTER MEDICATION Take 1 tablet by mouth daily. Zyrtec  . Turmeric 500 MG TABS Take 1,000 mg by mouth.   No current facility-administered medications on file prior to visit.     Health Maintenance:   Immunization History  Administered Date(s) Administered  . Tdap 10/22/2011   Tetanus: 2013 Pneumovax: N/A Prevnar 13: due age 16 Flu vaccine: declines Zostavax: N/A  Pap: 2017 get with Dr. Jennette Kettle  [facility]: 10/2017  gets with Dr. Jennette Kettle DEXA: 2017 Osteopenia Dr. Jennette Kettle due next year per patient Colonoscopy: going to check cologuard, patient states insurance covers cologuard.  CXR 11/2015 MRI cervical 2015 MRI brain 2015 Sleep study 2017 Echo 02/2016  Eye: Dr. Emily Filbert   Patient Care Team: Lucky Cowboy, MD as PCP - General (Internal Medicine) Nahser, Deloris Ping, MD as PCP - Cardiology (Cardiology) Manning Charity, OD as Referring Physician (Optometry) Freddy Finner, MD as Consulting Physician (Obstetrics and Gynecology) Janalyn Harder, MD as Consulting Physician (Dermatology) Charna Elizabeth, MD as Consulting Physician (Gastroenterology)  Medical History:  Past Medical History:  Diagnosis Date  . Allergy   . Anemia   . Arthritis    in hands  . DDD (degenerative disc disease)   . Headache   . Hyperlipidemia    diet controlled  . PONV (postoperative nausea and vomiting)   . Unspecified vitamin D deficiency    Allergies Allergies  Allergen Reactions  . Erythromycin Hives  . Penicillins Hives  . Prednisone Other (See Comments)    pain  . Tetracyclines & Related Hives    SURGICAL HISTORY She  has a past surgical history that includes Diagnostic laparoscopy; Abdominal hysterectomy; Tonsillectomy; Cervical disc surgery; Anterior cervical decomp/discectomy fusion (03/29/2012); and Cesarean section. FAMILY HISTORY Her family history includes  Atrial fibrillation in her mother; Cancer in her father and maternal grandfather; Diabetes in her father and maternal aunt; Heart disease in her father; Hyperlipidemia in her father and mother; Hypertension in her father and mother. SOCIAL HISTORY She  reports that she quit smoking about 7 years ago. She quit after 3.00 years of use. She has never used smokeless tobacco. She reports that she does not drink alcohol or use drugs.  Review of Systems  Constitutional: Positive for malaise/fatigue. Negative for chills, diaphoresis, fever and weight loss.  HENT: Positive for tinnitus (bilateral ears, worse with lying down without any noise going, no dizziness/HA/decreased hearing. ). Negative for congestion, ear discharge, ear pain, hearing loss, nosebleeds and sore throat.   Eyes: Negative for blurred vision, double vision, photophobia, pain, discharge and redness.  Respiratory: Negative.  Negative for stridor.   Cardiovascular: Negative.   Gastrointestinal: Negative for abdominal pain, diarrhea, nausea and vomiting.  Genitourinary: Negative.   Musculoskeletal: Positive for back pain and joint pain (bilateral feet). Negative for falls, myalgias and neck pain.  Skin: Negative.  Neurological: Negative for dizziness, tingling, tremors, sensory change, speech change, focal weakness, seizures, loss of consciousness, weakness and headaches.  Psychiatric/Behavioral: Negative.     Physical Exam: Estimated body mass index is 25.63 kg/m as calculated from the following:   Height as of this encounter: 5\' 5"  (1.651 m).   Weight as of this encounter: 154 lb (69.9 kg). BP 124/72   Pulse 62   Temp 97.6 F (36.4 C)   Ht 5\' 5"  (1.651 m)   Wt 154 lb (69.9 kg)   SpO2 96%   BMI 25.63 kg/m  General Appearance: Well nourished, in no apparent distress. Eyes: PERRLA, EOMs, conjunctiva no swelling or erythema, normal fundi and vessels. Sinuses: No Frontal/maxillary tenderness ENT/Mouth: Ext aud canals clear,  normal light reflex with TMs without erythema, bulging.  Good dentition. No erythema, swelling, or exudate on post pharynx. Tonsils not swollen or erythematous. Hearing normal.  Neck: Supple, thyroid with some nodules and possible very slight goiter. No bruits Respiratory: Respiratory effort normal, BS equal bilaterally without rales, rhonchi, wheezing or stridor. Cardio: RRR without murmurs, rubs or gallops. Brisk peripheral pulses without edema.  Chest: symmetric, with normal excursions and percussion. Breasts: defer Abdomen: Soft, +BS, no guarding, hernias, masses, or organomegaly. .  Lymphatics: Non tender without lymphadenopathy.  Genitourinary: defer Musculoskeletal: Full ROM all peripheral extremities,5/5 strength, and normal gait. Skin: Warm, dry without rashes, lesions, ecchymosis.  Neuro: Cranial nerves intact, reflexes equal bilaterally. Normal muscle tone, no cerebellar symptoms. Sensation intact.  Psych: Awake and oriented X 3, normal affect, Insight and Judgment appropriate.   EKG: defer   Quentin MullingAmanda Zayneb Baucum 2:22 PM

## 2019-01-05 ENCOUNTER — Other Ambulatory Visit: Payer: Self-pay

## 2019-01-05 ENCOUNTER — Encounter: Payer: Self-pay | Admitting: Physician Assistant

## 2019-01-05 ENCOUNTER — Ambulatory Visit (INDEPENDENT_AMBULATORY_CARE_PROVIDER_SITE_OTHER): Payer: 59 | Admitting: Physician Assistant

## 2019-01-05 VITALS — BP 124/72 | HR 62 | Temp 97.6°F | Ht 65.0 in | Wt 154.0 lb

## 2019-01-05 DIAGNOSIS — I493 Ventricular premature depolarization: Secondary | ICD-10-CM

## 2019-01-05 DIAGNOSIS — I10 Essential (primary) hypertension: Secondary | ICD-10-CM

## 2019-01-05 DIAGNOSIS — Z79899 Other long term (current) drug therapy: Secondary | ICD-10-CM

## 2019-01-05 DIAGNOSIS — M8580 Other specified disorders of bone density and structure, unspecified site: Secondary | ICD-10-CM

## 2019-01-05 DIAGNOSIS — D649 Anemia, unspecified: Secondary | ICD-10-CM

## 2019-01-05 DIAGNOSIS — Z131 Encounter for screening for diabetes mellitus: Secondary | ICD-10-CM

## 2019-01-05 DIAGNOSIS — Z0000 Encounter for general adult medical examination without abnormal findings: Secondary | ICD-10-CM | POA: Diagnosis not present

## 2019-01-05 DIAGNOSIS — G4701 Insomnia due to medical condition: Secondary | ICD-10-CM

## 2019-01-05 DIAGNOSIS — Z0001 Encounter for general adult medical examination with abnormal findings: Secondary | ICD-10-CM

## 2019-01-05 DIAGNOSIS — G43109 Migraine with aura, not intractable, without status migrainosus: Secondary | ICD-10-CM

## 2019-01-05 DIAGNOSIS — E559 Vitamin D deficiency, unspecified: Secondary | ICD-10-CM

## 2019-01-05 DIAGNOSIS — M4802 Spinal stenosis, cervical region: Secondary | ICD-10-CM

## 2019-01-05 DIAGNOSIS — G8929 Other chronic pain: Secondary | ICD-10-CM

## 2019-01-05 DIAGNOSIS — E785 Hyperlipidemia, unspecified: Secondary | ICD-10-CM

## 2019-01-05 DIAGNOSIS — Z1211 Encounter for screening for malignant neoplasm of colon: Secondary | ICD-10-CM

## 2019-01-05 DIAGNOSIS — I341 Nonrheumatic mitral (valve) prolapse: Secondary | ICD-10-CM

## 2019-01-05 DIAGNOSIS — F3341 Major depressive disorder, recurrent, in partial remission: Secondary | ICD-10-CM

## 2019-01-05 MED ORDER — NURTEC 75 MG PO TBDP: 75.0000 mg | ORAL_TABLET | ORAL | 3 refills | Status: DC | PRN

## 2019-01-05 NOTE — Patient Instructions (Addendum)
COLOGUARD INFORMATION   Cologuard is an easy to use noninvasive colon cancer screening test based on the latest advances in stool DNA science.   Colon cancer is 3rd most diagnosed cancer and 2nd leading cause of death in both men and women 55 years of age and older despite being one of the most preventable and treatable cancers if found early.  4 of out 5 people diagnosed with colon cancer have NO prior family history.  When caught EARLY 90% of colon cancer is curable.   More than 92% of cologuard patients have NO out of pocket cost for screening however only your insurer can confirm how Cologuard would be covered for you. [facility]ologuard has a team of specialist that can help you contact your insurer and ask the right questions. Please call [phone] so they can help.   You will receive a short call from [facility] support center at [employer], when you receive a call they will say they are from [place],  to confirm your mailing address and give you more information.  When they calll you, it will appear on the caller ID as "[facility] [facility]" or in some cases only this number will appear, [phone].   [facility] [employer] will ship your collection kit directly to you. You will collect a single stool sample in the privacy of your own home, no special preparation required. You will return the kit via [facility] pre-paid shipping or pick-up, in the same box it arrived in. Then I will contact you to discuss your results after I receive them from the laboratory.   If you have any questions or concerns, [facility] Customer Support Specialist are available 24 hours a day, 7 days a week at [phone] or go to [email or web address].       1.  Limit use of pain relievers to no more than 2 days out of week to prevent risk of rebound or medication-overuse headache. 2.  Keep headache diary 3.  Exercise, hydration, caffeine cessation, sleep hygiene, monitor for and avoid  triggers 4.  Consider:  magnesium citrate 471m daily, riboflavin 4017mdaily, and coenzyme Q10 10034mhree times daily   We may also treat TMJ if we think you have it If you are having frequent migraines we may put you on a once a day medication with fast acting medication to take. Also there is such a thing called rebound headache from over use of acute medications.  Please do not use rescue or acute medications more than 10 days a month or more than 3 days per week, this can cause a withdrawal and a rebound headache.  Here is more information below  Please remember, common headache triggers are: sleep deprivation, dehydration, overheating, stress, hypoglycemia or skipping meals and blood sugar fluctuations, excessive pain medications or excessive alcohol use or caffeine withdrawal. Some people have food triggers such as aged cheese, orange juice or chocolate, especially dark chocolate, or MSG (monosodium glutamate). Try to avoid these headache triggers as much possible.   It may be helpful to keep a headache diary to figure out what makes your headaches worse or brings them on and what alleviates them. Some people report headache onset after exercise but studies have shown that regular exercise may actually prevent headaches from coming. If you have exercise-induced headaches, please make sure that you drink plenty of fluid before and after exercising and that you do not over do it and do not overheat.   Please go to the ER if  there is weakness, thunderclap headache, visual changes, or any concerning factors    Migraine Headache A migraine headache is an intense, throbbing pain on one or both sides of your head. Recurrent migraines keep coming back. A migraine can last for 30 minutes to several hours. CAUSES  The exact cause of a migraine headache is not always known. However, a migraine may be caused when nerves in the brain become irritated and release chemicals that cause inflammation.  This causes pain. Certain things may also trigger migraines, such as:   Alcohol.  Smoking.  Stress.  Menstruation.  Aged cheeses.  Foods or drinks that contain nitrates, glutamate, aspartame, or tyramine.  Lack of sleep.  Chocolate.  Caffeine.  Hunger.  Physical exertion.  Fatigue.  Medicines used to treat chest pain (nitroglycerine), birth control pills, estrogen, and some blood pressure medicines. SYMPTOMS   Pain on one or both sides of your head.  Pulsating or throbbing pain.  Severe pain that prevents daily activities.  Pain that is aggravated by any physical activity.  Nausea, vomiting, or both.  Dizziness.  Pain with exposure to bright lights, loud noises, or activity.  General sensitivity to bright lights, loud noises, or smells. Before you get a migraine, you may get warning signs that a migraine is coming (aura). An aura may include:  Seeing flashing lights.  Seeing bright spots, halos, or zigzag lines.  Having tunnel vision or blurred vision.  Having feelings of numbness or tingling.  Having trouble talking.  Having muscle weakness. DIAGNOSIS  A recurrent migraine headache is often diagnosed based on:  Symptoms.  Physical examination.  A CT scan or MRI of your head. These imaging tests cannot diagnose migraines but can help rule out other causes of headaches.   TREATMENT  Medicines may be given for pain and nausea. Medicines can also be given to help prevent recurrent migraines. HOME CARE INSTRUCTIONS  Only take over-the-counter or prescription medicines for pain or discomfort as directed by your health care provider. The use of long-term narcotics is not recommended.  Lie down in a dark, quiet room when you have a migraine.  Keep a journal to find out what may trigger your migraine headaches. For example, write down:  What you eat and drink.  How much sleep you get.  Any change to your diet or medicines.  Limit alcohol  consumption.  Quit smoking if you smoke.  Get 7-9 hours of sleep, or as recommended by your health care provider.  Limit stress.  Keep lights dim if bright lights bother you and make your migraines worse. SEEK MEDICAL CARE IF:   You do not get relief from the medicines given to you.  You have a recurrence of pain.  You have a fever. SEEK IMMEDIATE MEDICAL CARE IF:  Your migraine becomes severe.  You have a stiff neck.  You have loss of vision.  You have muscular weakness or loss of muscle control.  You start losing your balance or have trouble walking.  You feel faint or pass out. . You have severe symptoms that are different from your first symptoms. MAKE SURE YOU:   Understand these instructions.  Will watch your condition.  Will get help right away if you are not doing well or get worse.   This information is not intended to replace advice given to you by your health care provider. Make sure you discuss any questions you have with your health care provider.   Document Released: 11/26/2000 Document Revised:  03/24/2014 Document Reviewed: 11/08/2012 Elsevier Interactive Patient Education 2016 [employer].  Common Migraine Triggers   Foods Aged cheese, alcohol, nuts, chocolate, yogurt, onions, figs, liver, caffeinated foods and beverages, monosodium glutamate (MSG), smoked or pickled fish/meat, nitrate/nitrate preserved foods (hotdogs, pepperoni, salami) tyramine  Medications Antibiotics (tetracycline, griseofulvin), antihypertensives (nifedipine, captopril), hormones (oral contraceptives, estrogens), histamine-2 blockers (cimetidine, raniidine, vasodilators (nitroglycerine, isosorbide dinitrate)  Sensory Stimuli Flickering/bright/fluorescent lights, bright sunlight, odors (perfume, chemicals, cigarette smoke)  Lifestyle Changes Time zones, sleep patterns, eating habits, caffeine withdrawal stress  Other Menstrual cycle, weather/season/air pressure changes, high  altitude  Adapted from Lennon and [place], [place]. Clin. [place]; [place] and [place]. Conquering Headache, 1998  Hormonal variations also are believed to play a part.  Fluctuations of the female hormone estrogen (such as just before menstruation) affect a chemical called serotonin-when serotonin levels in the brain fall, the dilation (expansion) of blood vessels in the brain that is characteristic of migraine often follows.  Many factors or "triggers" can start a migraine.  In people who get migraines, most experts think certain activities or foods may trigger temporary changes in the blood vessels around the brain.  Swelling of these blood vessels may cause pain in the nearby nerves.  Allergy Headaches:  Hotdogs Milk  Onions  Thyme Bacon  Chocolate Garlic  Nutmeg Ham  Dark Cola Pork  Cinnamon Salami  Nuts  Egg  Ginger Sausage Red wine Cloves  Cheddar Cheese Caffeine

## 2019-01-06 LAB — COMPLETE METABOLIC PANEL WITH GFR
AG Ratio: 1.7 (calc) (ref 1.0–2.5)
ALT: 13 U/L (ref 6–29)
AST: 14 U/L (ref 10–35)
Albumin: 4.5 g/dL (ref 3.6–5.1)
Alkaline phosphatase (APISO): 92 U/L (ref 37–153)
BUN: 12 mg/dL (ref 7–25)
CO2: 27 mmol/L (ref 20–32)
Calcium: 10.2 mg/dL (ref 8.6–10.4)
Chloride: 102 mmol/L (ref 98–110)
Creat: 0.68 mg/dL (ref 0.50–1.05)
GFR, Est African American: 114 mL/min/{1.73_m2} (ref >=60)
GFR, Est Non African American: 98 mL/min/{1.73_m2} (ref >=60)
Globulin: 2.6 g/dL (calc) (ref 1.9–3.7)
Glucose, Bld: 97 mg/dL (ref 65–99)
Potassium: 4.7 mmol/L (ref 3.5–5.3)
Sodium: 138 mmol/L (ref 135–146)
Total Bilirubin: 0.5 mg/dL (ref 0.2–1.2)
Total Protein: 7.1 g/dL (ref 6.1–8.1)

## 2019-01-06 LAB — CBC WITH DIFFERENTIAL/PLATELET
Absolute Monocytes: 371 cells/uL (ref 200–950)
Basophils Absolute: 80 cells/uL (ref 0–200)
Basophils Relative: 1.5 %
Eosinophils Absolute: 90 cells/uL (ref 15–500)
Eosinophils Relative: 1.7 %
HCT: 38.2 % (ref 35.0–45.0)
Hemoglobin: 12.8 g/dL (ref 11.7–15.5)
Lymphs Abs: 1776 cells/uL (ref 850–3900)
MCH: 29.7 pg (ref 27.0–33.0)
MCHC: 33.5 g/dL (ref 32.0–36.0)
MCV: 88.6 fL (ref 80.0–100.0)
MPV: 9.8 fL (ref 7.5–12.5)
Monocytes Relative: 7 %
Neutro Abs: 2984 cells/uL (ref 1500–7800)
Neutrophils Relative %: 56.3 %
Platelets: 337 10*3/uL (ref 140–400)
RBC: 4.31 10*6/uL (ref 3.80–5.10)
RDW: 12.9 % (ref 11.0–15.0)
Total Lymphocyte: 33.5 %
WBC: 5.3 10*3/uL (ref 3.8–10.8)

## 2019-01-06 LAB — LIPID PANEL
Cholesterol: 201 mg/dL — ABNORMAL HIGH (ref <200)
HDL: 51 mg/dL (ref >=50)
LDL Cholesterol (Calc): 134 mg/dL (calc) — ABNORMAL HIGH
Non-HDL Cholesterol (Calc): 150 mg/dL (calc) — ABNORMAL HIGH (ref <130)
Total CHOL/HDL Ratio: 3.9 (calc) (ref <5.0)
Triglycerides: 65 mg/dL (ref <150)

## 2019-01-06 LAB — URINALYSIS, ROUTINE W REFLEX MICROSCOPIC
Bilirubin Urine: NEGATIVE
Glucose, UA: NEGATIVE
Hgb urine dipstick: NEGATIVE
Ketones, ur: NEGATIVE
Leukocytes,Ua: NEGATIVE
Nitrite: NEGATIVE
Protein, ur: NEGATIVE
Specific Gravity, Urine: 1.006 (ref 1.001–1.03)
pH: 7 (ref 5.0–8.0)

## 2019-01-06 LAB — VITAMIN D 25 HYDROXY (VIT D DEFICIENCY, FRACTURES): Vit D, 25-Hydroxy: 37 ng/mL (ref 30–100)

## 2019-01-06 LAB — TSH: TSH: 0.74 mIU/L

## 2019-01-06 LAB — MICROALBUMIN / CREATININE URINE RATIO
Creatinine, Urine: 41 mg/dL (ref 20–275)
Microalb, Ur: <0.2 mg/dL

## 2019-01-06 LAB — HEMOGLOBIN A1C
Hgb A1c MFr Bld: 5.3 % of total Hgb (ref <5.7)
Mean Plasma Glucose: 105 (calc)
eAG (mmol/L): 5.8 (calc)

## 2019-01-06 LAB — MAGNESIUM: Magnesium: 2.2 mg/dL (ref 1.5–2.5)

## 2019-01-11 ENCOUNTER — Telehealth: Payer: Self-pay

## 2019-01-11 NOTE — Telephone Encounter (Signed)
-----   Message from Vicie Mutters, [place] sent at 01/11/2019 10:01 AM EDT ----- Regarding: RE: MED REQUEST Contact: [phone] We can try effexor is which for hot flashes and mood for migraine prevention or topamax which can help chronic pain.  Suggest she sign up for mychart.  Estill Bamberg ----- Message ----- From: Elenor Quinones, CMA Sent: 01/11/2019   9:24 AM EDT To: Vicie Mutters, PA-C Subject: MED REQUEST                                    PER OFFICE NOTE:                     Instead of MIGRAINE meds The patient would like to have a PREVENTATIVE med for her migraine.    [facility]     [facility] [address]  Please & thank you.

## 2019-01-11 NOTE — Telephone Encounter (Signed)
Unable to reach patient at this time 

## 2019-01-26 ENCOUNTER — Telehealth: Payer: Self-pay | Admitting: Physician Assistant

## 2019-01-26 MED ORDER — UBRELVY 50 MG PO TABS: 50.0000 mg | ORAL_TABLET | Freq: Every day | ORAL | 0 refills | Status: DC | PRN

## 2019-01-26 NOTE — Telephone Encounter (Signed)
-----   Message from Elenor Quinones, [place] sent at 01/25/2019  2:13 PM EST ----- Regarding: med requested Contact: [phone] Per OFFICE NOTE:   Would like a Rx for her migraines.  [facility]

## 2019-01-31 ENCOUNTER — Other Ambulatory Visit: Payer: Self-pay | Admitting: Physician Assistant

## 2019-01-31 MED ORDER — RIZATRIPTAN BENZOATE 10 MG PO TABS: 10.0000 mg | ORAL_TABLET | ORAL | 0 refills | Status: DC | PRN

## 2019-06-15 ENCOUNTER — Institutional Professional Consult (permissible substitution): Payer: 59 | Admitting: Neurology

## 2019-06-20 ENCOUNTER — Encounter: Payer: Self-pay | Admitting: Neurology

## 2019-06-21 ENCOUNTER — Encounter: Payer: Self-pay | Admitting: Neurology

## 2019-06-21 ENCOUNTER — Ambulatory Visit: Payer: 59 | Admitting: Neurology

## 2019-06-21 ENCOUNTER — Other Ambulatory Visit: Payer: Self-pay

## 2019-06-21 VITALS — BP 122/75 | HR 73 | Temp 96.0°F | Ht 65.0 in | Wt 156.0 lb

## 2019-06-21 DIAGNOSIS — F3341 Major depressive disorder, recurrent, in partial remission: Secondary | ICD-10-CM | POA: Diagnosis not present

## 2019-06-21 DIAGNOSIS — M5030 Other cervical disc degeneration, unspecified cervical region: Secondary | ICD-10-CM

## 2019-06-21 DIAGNOSIS — R200 Anesthesia of skin: Secondary | ICD-10-CM

## 2019-06-21 DIAGNOSIS — R202 Paresthesia of skin: Secondary | ICD-10-CM

## 2019-06-21 NOTE — Patient Instructions (Signed)
Cervical Radiculopathy  Cervical radiculopathy happens when a nerve in the neck (a cervical nerve) is pinched or bruised. This condition can happen because of an injury to the cervical spine (vertebrae) in the neck, or as part of the normal aging process. Pressure on the cervical nerves can cause pain or numbness that travels from the neck all the way down into the arm and fingers. Usually, this condition gets better with rest. Treatment may be needed if the condition does not improve. What are the causes? This condition may be caused by:  A neck injury.  A bulging (herniated) disk.  Muscle spasms.  Muscle tightness in the neck because of overuse.  Arthritis.  Breakdown or degeneration in the bones and joints of the spine (spondylosis) due to aging.  Bone spurs that may develop near the cervical nerves. What are the signs or symptoms? Symptoms of this condition include:  Pain. The pain may travel from the neck to the arm and hand. The pain can be severe or irritating. It may be worse when you move your neck.  Numbness or tingling in your arm or hand.  Weakness in the affected arm and hand, in severe cases. How is this diagnosed? This condition may be diagnosed based on your symptoms, your medical history, and a physical exam. You may also have tests, including:  X-rays.  A CT scan.  An MRI.  An electromyogram (EMG).  Nerve conduction tests. How is this treated? In many cases, treatment is not needed for this condition. With rest, the condition usually gets better over time. If treatment is needed, options may include:  Wearing a soft neck collar (cervical collar) for short periods of time, as told by your health care provider.  Doing physical therapy to strengthen your neck muscles.  Taking medicines, such as NSAIDs or oral corticosteroids.  Having spinal injections, in severe cases.  Having surgery. This may be needed if other treatments do not help. Different types  of surgery may be done depending on the cause of this condition. Follow these instructions at home: If you have a cervical collar:  Wear it as told by your health care provider. Remove it only as told by your health care provider.  Ask your health care provider if you can remove the collar for cleaning and bathing. If you are allowed to remove the collar for cleaning or bathing: ? Follow instructions from your health care provider about how to remove the collar safely. ? Clean the collar by wiping it with mild soap and water and drying it completely. ? Take out any removable pads in the collar every 1-2 days, and wash them by hand with soap and water. Let them air-dry completely before you put them back in the collar. ? Check your skin under the collar for irritation or sores. If you see any, tell your health care provider. Managing pain      Take over-the-counter and prescription medicines only as told by your health care provider.  If directed, put ice on the affected area. ? If you have a soft neck collar, remove it as told by your health care provider. ? Put ice in a plastic bag. ? Place a towel between your skin and the bag. ? Leave the ice on for 20 minutes, 2-3 times a day.  If applying ice does not help, you can try using heat. Use the heat source that your health care provider recommends, such as a moist heat pack or a heating pad. ?  Place a towel between your skin and the heat source. ? Leave the heat on for 20-30 minutes. ? Remove the heat if your skin turns bright red. This is especially important if you are unable to feel pain, heat, or cold. You may have a greater risk of getting burned.  Try a gentle neck and shoulder massage to help relieve symptoms. Activity  Rest as needed.  Return to your normal activities as told by your health care provider. Ask your health care provider what activities are safe for you.  Do stretching and strengthening exercises as told by  your health care provider or physical therapist.  Do not lift anything that is heavier than 10 lb (4.5 kg) until your health care provider tells you that it is safe. General instructions  Use a flat pillow when you sleep.  Do not drive while wearing a cervical collar. If you do not have a cervical collar, ask your health care provider if it is safe to drive while your neck heals.  Ask your health care provider if the medicine prescribed to you requires you to avoid driving or using heavy machinery.  Do not use any products that contain nicotine or tobacco, such as cigarettes, e-cigarettes, and chewing tobacco. These can delay healing. If you need help quitting, ask your health care provider.  Keep all follow-up visits as told by your health care provider. This is important. Contact a health care provider if:  Your condition does not improve with treatment. Get help right away if:  Your pain gets much worse and cannot be controlled with medicines.  You have weakness or numbness in your hand, arm, face, or leg.  You have a high fever.  You have a stiff, rigid neck.  You lose control of your bowels or your bladder (have incontinence).  You have trouble with walking, balance, or speaking. Summary  Cervical radiculopathy happens when a nerve in the neck is pinched or bruised.  A nerve can get pinched from a bulging disk, arthritis, muscle spasms, or an injury to the neck.  Symptoms include pain, tingling, or numbness radiating from the neck into the arm or hand. Weakness can also occur in severe cases.  Treatment may include rest, wearing a cervical collar, and physical therapy. Medicines may be prescribed to help with pain. In severe cases, injections or surgery may be needed. This information is not intended to replace advice given to you by your health care provider. Make sure you discuss any questions you have with your health care provider. Document Revised: 01/22/2018  Document Reviewed: 01/22/2018 Elsevier Patient Education  [place]. Spinal Stenosis  Spinal stenosis occurs when the open space (spinal canal) between the bones of your spine (vertebrae) narrows, putting pressure on the spinal cord or nerves. What are the causes? This condition is caused by areas of bone pushing into the central canals of your vertebrae. This condition may be present at birth (congenital), or it may be caused by:  Arthritic deterioration of your vertebrae (spinal degeneration). This usually starts around age 5.  Injury or trauma to the spine.  Tumors in the spine.  Calcium deposits in the spine. What are the signs or symptoms? Symptoms of this condition include:  Pain in the neck or back that is generally worse with activities, particularly when standing and walking.  Numbness, tingling, hot or cold sensations, weakness, or weariness in your legs.  Pain going up and down the leg (sciatica).  Frequent episodes of falling.  A foot-slapping gait that leads to muscle weakness. In more serious cases, you may develop:  Problemspassing stool or passing urine.  Difficulty having sex.  Loss of feeling in part or all of your leg. Symptoms may come on slowly and get worse over time. How is this diagnosed? This condition is diagnosed based on your medical history and a physical exam. Tests will also be done, such as:  MRI.  CT scan.  X-ray. How is this treated? Treatment for this condition often focuses on managing your pain and any other symptoms. Treatment may include:  Practicing good posture to lessen pressure on your nerves.  Exercising to strengthen muscles, build endurance, improve balance, and maintain good joint movement (range of motion).  Losing weight, if needed.  Taking medicines to reduce swelling, inflammation, or pain.  Assistive devices, such as a corset or brace. In some cases, surgery may be needed. The most common procedure  is decompression laminectomy. This is done to remove excess bone that puts pressure on your nerve roots. Follow these instructions at home: Managing pain, stiffness, and swelling  Do all exercises and stretches as told by your health care provider.  Practice good posture. If you were given a brace or a corset, wear it as told by your health care provider.  Do not do any activities that cause pain. Ask your health care provider what activities are safe for you.  Do not lift anything that is heavier than 10 lb (4.5 kg) or the limit that your health care provider tells you.  Maintain a healthy weight. Talk with your health care provider if you need help losing weight.  If directed, apply heat to the affected area as often as told by your health care provider. Use the heat source that your health care provider recommends, such as a moist heat pack or a heating pad. ? Place a towel between your skin and the heat source. ? Leave the heat on for 20-30 minutes. ? Remove the heat if your skin turns bright red. This is especially important if you are not able to feel pain, heat, or cold. You may have a greater risk of getting burned. General instructions  Take over-the-counter and prescription medicines only as told by your health care provider.  Do not use any products that contain nicotine or tobacco, such as cigarettes and e-cigarettes. If you need help quitting, ask your health care provider.  Eat a healthy diet. This includes plenty of fruits and vegetables, whole grains, and low-fat (lean) protein.  Keep all follow-up visits as told by your health care provider. This is important. Contact a health care provider if:  Your symptoms do not get better or they get worse.  You have a fever. Get help right away if:  You have new or worse pain in your neck or upper back.  You have severe pain that cannot be controlled with medicines.  You are dizzy.  You have vision problems, blurred  vision, or double vision.  You have a severe headache that is worse when you stand.  You have nausea or you vomit.  You develop new or worse numbness or tingling in your back or legs.  You have pain, redness, swelling, or warmth in your arm or leg. Summary  Spinal stenosis occurs when the open space (spinal canal) between the bones of your spine (vertebrae) narrows. This narrowing puts pressure on the spinal cord or nerves.  Spinal stenosis can cause numbness, weakness, or pain in the  neck, back, and legs.  This condition may be caused by a birth defect, arthritic deterioration of your vertebrae, injury, tumors, or calcium deposits.  This condition is usually diagnosed with MRIs, CT scans, and X-rays. This information is not intended to replace advice given to you by your health care provider. Make sure you discuss any questions you have with your health care provider. Document Revised: 02/13/2017 Document Reviewed: 02/06/2016 Elsevier Patient Education  2020 [employer].

## 2019-06-21 NOTE — Progress Notes (Signed)
SLEEP MEDICINE CLINIC    Provider:  Larey Seat, MD  Primary Care Physician:  Unk Pinto, MD [address] [place] [address]     Referring Provider: Unk Pinto, [facility] [place],  [place] [address]          Chief Complaint according to patient   Patient presents with:    . New Patient (Initial Visit)     RN : pt states that in 2014 post neck surgery she developed tingling throughout the body and tremors. she was strarted on lyrica and around 02/2018 was able to come off and was doing well. couple months agp the tremors came back and tingling throughout the body. pt states that she was then started on gabapentin.  she started to have weight gain and stopped the medication. she states that the tremors have lessened in frequency and that the tingling is intermittent all which is more bearable now, she sees a pain management doctor- sometimes the hands tingle .      HISTORY OF PRESENT ILLNESS:  [name] Smith is a 54  year old Caucasian female patient, originally from [place], [place]- seen here upon a referral on 06/21/2019 from Dr.  Leonie Green. [facility]. Chief concern according to patient :" tingling in my hands"- back problems - "needing one hydrocodone at night".    I have the pleasure of seeing [name] Smith today, a right-handed Caucasian female , who  has a past medical history of Allergy, Anemia, Arthritis, DDD (degenerative disc disease), Headache, Hyperlipidemia, Hypertension, Neuropathy, PONV (postoperative nausea and vomiting), and Unspecified vitamin D deficiency.. Cervical spinal DDD- status post surgery .   Every once in a while the fingers feel numb, on both hands. Tremors on both hands were essential, non parkinsonian, bilateral tremors, now improved.  No numbness in elbows or shoulder, no radiculopathic band.          Review of Systems: Out of a complete 14 system review, the  patient complains of only the following symptoms, and all other reviewed systems are negative.:  Fatigue, sleepiness , back pain. TMJ click, popping. Hands trembling.   Social History   Socioeconomic History  . Marital status: Married    Spouse name: Not on file  . Number of children: Not on file  . Years of education: Not on file  . Highest education level: Not on file  Occupational History  . Not on file  Tobacco Use  . Smoking status: Former Smoker    Years: 3.00    Quit date: 08/17/2011    Years since quitting: 7.8  . Smokeless tobacco: Never Used  Substance and Sexual Activity  . Alcohol use: No  . Drug use: No  . Sexual activity: Yes    Birth control/protection: Surgical    Comment: 1 cig every 2-3 dyas  Other Topics Concern  . Not on file  Social History Narrative  . Not on file   Social Determinants of Health   Financial Resource Strain:   . Difficulty of Paying Living Expenses:   Food Insecurity:   . Worried About Charity fundraiser in the Last Year:   . Arboriculturist in the Last Year:   Transportation Needs:   . Film/video editor (Medical):   Marland Kitchen Lack of Transportation (Non-Medical):   Physical Activity:   . Days of Exercise per Week:   . Minutes of Exercise per Session:  Stress:   . Feeling of Stress :   Social Connections:   . Frequency of Communication with Friends and Family:   . Frequency of Social Gatherings with Friends and Family:   . Attends Religious Services:   . Active Member of Clubs or Organizations:   . Attends Banker Meetings:   Marland Kitchen Marital Status:     Family History  Problem Relation Age of Onset  . Hypertension Mother   . Hyperlipidemia Mother   . Atrial fibrillation Mother   . Heart disease Father   . Hyperlipidemia Father   . Cancer Father        prostate  . Hypertension Father   . Diabetes Father   . Diabetes Maternal Aunt   . Cancer Maternal Grandfather        colon    Past Medical History:    Diagnosis Date  . Allergy   . Anemia   . Arthritis    in hands  . DDD (degenerative disc disease)   . Headache   . Hyperlipidemia    diet controlled  . Hypertension   . Neuropathy   . PONV (postoperative nausea and vomiting)   . Unspecified vitamin D deficiency     Past Surgical History:  Procedure Laterality Date  . ABDOMINAL HYSTERECTOMY    . ANTERIOR CERVICAL DECOMP/DISCECTOMY FUSION  03/29/2012   Procedure: ANTERIOR CERVICAL DECOMPRESSION/DISCECTOMY FUSION 1 LEVEL;  Surgeon: Temple Pacini, MD;  Location: [facility];  Service: Neurosurgery;  Laterality: Bilateral;  Cervical four-five anterior cervical discectomy with fusion   . CERVICAL DISC SURGERY    . CESAREAN SECTION    . DIAGNOSTIC LAPAROSCOPY    . TONSILLECTOMY       Current Outpatient Medications on File Prior to Visit  Medication Sig Dispense Refill  . Cetirizine HCl (ZYRTEC PO) Take 1 tablet by mouth daily. Allertec brand    . Cholecalciferol (VITAMIN D) 2000 UNITS CAPS Take 6,000 Units by mouth daily.     . Cyanocobalamin 2500 MCG TABS Take by mouth.    Marland Kitchen HYDROcodone-acetaminophen (NORCO/VICODIN) 5-325 MG tablet Take 1 tablet by mouth every 6 (six) hours as needed for moderate pain.    . metoprolol succinate (TOPROL-XL) 25 MG 24 hr tablet TAKE 1/2 TABLET BY MOUTH DAILY (Patient taking differently: 12.5 mg. ) 15 tablet 10  . Misc Natural Products (GLUCOSAMINE CHONDROITIN ADV PO) Take 1 tablet by mouth daily. Move free    . rizatriptan (MAXALT) 10 MG tablet Take 1 tablet (10 mg total) by mouth as needed for migraine. May repeat in 2 hours if needed 10 tablet 0   No current facility-administered medications on file prior to visit.    Allergies  Allergen Reactions  . Erythromycin Hives  . Penicillins Hives  . Prednisone Other (See Comments)    pain  . Tetracyclines & Related Hives    Physical exam:  Today's Vitals   06/21/19 1353  BP: 122/75  Pulse: 73  Temp: (!) 96 F (35.6 C)  Weight: 156 lb (70.8  kg)  Height: 5\' 5"  (1.651 m)   Body mass index is 25.96 kg/m.   Wt Readings from Last 3 Encounters:  06/21/19 156 lb (70.8 kg)  01/05/19 154 lb (69.9 kg)  11/18/18 150 lb 9.6 oz (68.3 kg)     Ht Readings from Last 3 Encounters:  06/21/19 5\' 5"  (1.651 m)  01/05/19 5\' 5"  (1.651 m)  11/18/18 5\' 5"  (1.651 m)      General:  The patient is awake, alert and appears not in acute distress. The patient is well groomed. Head: Normocephalic, atraumatic. Neck is supple. Mallampati 2,  neck circumference: 14 inches . Nasal airflow  patent.  Retrognathia is seen.  Dental status:  Small lower jaw, TMJ pop. Cardiovascular:  Regular rate and cardiac rhythm by pulse,  without distended neck veins. Respiratory: Lungs are clear to auscultation.  Skin:  Without evidence of ankle edema, or rash. Trunk: The patient's posture is erect.   Neurologic exam : The patient is awake and alert, oriented to place and time.   Memory subjective described as intact.  Attention span & concentration ability appears normal.  Speech is fluent,  without  dysarthria, dysphonia or aphasia.  Mood and affect are appropriate.   Cranial nerves: no loss of smell or taste reported  Pupils are equal and briskly reactive to light. Funduscopic exam deferred. .  Extraocular movements in vertical and horizontal planes were intact and without nystagmus. No Diplopia. Visual fields by finger perimetry are intact. Hearing was intact to soft voice and finger rubbing.    Facial sensation intact to fine touch.  Facial motor strength is symmetric and tongue and uvula move midline.  Neck ROM : rotation, tilt and flexion extension were normal for age and shoulder shrug was symmetrical.    Motor exam:  Symmetric bulk, tone and ROM.   Normal tone without cog -wheeling, symmetric grip strength appears low. Sensory:  Fine touch, pinprick and vibration were tested and normal.  Proprioception tested in the upper extremities was  normal. Coordination: Rapid alternating movements in the fingers/hands were of normal speed.  The Finger-to-nose maneuver was intact without evidence of ataxia, dysmetria and there is only mild essential  tremor. Gait and station: Patient could rise unassisted from a seated position, walked without assistive device.  Stance is of normal width/ base and the patient turned with3 steps.  Toe and heel walk were deferred.  Deep tendon reflexes: in the  upper and lower extremities are symmetric and intact.  Babinski response was deferred.      After spending a total time of 35 minutes face to face and additional time for physical and neurologic examination, review of laboratory studies,  personal review of imaging studies, reports and results of other testing and review of referral information / records as far as provided in visit, I have established the following assessments: No incontinence and no balance issues.   Cervical spine image is needed- I would much prefer an MRI of the cervical spine with and without gadolinium.   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  2) no peripheral neuropathy is present.     My Plan is to proceed with:  Above named images and EMG, NCV with Dr Terrace Arabia.     I would like to thank Lucky Cowboy, MD and Lucky Cowboy, Md [address] [place],  [place] [address] for allowing me to meet with and to take care of this pleasant patient.   In short, [name] Smith is presenting with hand numbness and tingling, intermittently - and grip strength loss., a symptom that arose after ACDF.   I plan to follow up either personally or through our NP within 2-3 month.     Electronically signed by: Melvyn Novas, MD 06/21/2019 2:01 PM  [facility] Board certified by The [employer] of Sleep Medicine and Diplomate of the [employer] of Sleep  Medicine. Board certified In Neurology through the [place], Fellow of the [employer] of Neurology. Medical Director of [employer].

## 2019-06-22 ENCOUNTER — Telehealth: Payer: Self-pay | Admitting: Neurology

## 2019-06-22 LAB — COMPREHENSIVE METABOLIC PANEL
ALT: 9 IU/L (ref 0–32)
AST: 16 IU/L (ref 0–40)
Albumin/Globulin Ratio: 1.7 (ref 1.2–2.2)
Albumin: 4.5 g/dL (ref 3.8–4.9)
Alkaline Phosphatase: 105 IU/L (ref 39–117)
BUN/Creatinine Ratio: 22 (ref 9–23)
BUN: 15 mg/dL (ref 6–24)
Bilirubin Total: 0.4 mg/dL (ref 0.0–1.2)
CO2: 23 mmol/L (ref 20–29)
Calcium: 9.8 mg/dL (ref 8.7–10.2)
Chloride: 105 mmol/L (ref 96–106)
Creatinine, Ser: 0.68 mg/dL (ref 0.57–1.00)
GFR calc Af Amer: 113 mL/min/{1.73_m2} (ref >59)
GFR calc non Af Amer: 98 mL/min/{1.73_m2} (ref >59)
Globulin, Total: 2.7 g/dL (ref 1.5–4.5)
Glucose: 94 mg/dL (ref 65–99)
Potassium: 4.3 mmol/L (ref 3.5–5.2)
Sodium: 143 mmol/L (ref 134–144)
Total Protein: 7.2 g/dL (ref 6.0–8.5)

## 2019-06-22 NOTE — Telephone Encounter (Signed)
Called the patient. There was no answer on the home phone. Attempted to reach on the mobile number and there was no answer. Unable to LVM due to no VM set up.   **if pt returns call please advise that lab results came back and are normal. Dr Dohmeier didn't see anything concerning.

## 2019-06-22 NOTE — Progress Notes (Signed)
All normal CMET - result.

## 2019-06-22 NOTE — Telephone Encounter (Signed)
-----   Message from Melvyn Novas, MD sent at 06/22/2019 12:43 PM EDT ----- All normal CMET - result.

## 2019-06-23 ENCOUNTER — Telehealth: Payer: Self-pay | Admitting: Neurology

## 2019-06-23 NOTE — Telephone Encounter (Signed)
Called the patient and advised of the normal lab results. She verbalized understanding and had no further questions.

## 2019-06-23 NOTE — Telephone Encounter (Signed)
no to the covid questions MR Cervical spine w/wo contrast Dr. Vickey Huger [employer] Auth: NPR via [employer] website, patient is scheduled at [facility] for 06/28/19.

## 2019-06-27 ENCOUNTER — Encounter: Payer: Self-pay | Admitting: Neurology

## 2019-06-27 ENCOUNTER — Other Ambulatory Visit: Payer: Self-pay

## 2019-06-27 ENCOUNTER — Ambulatory Visit: Payer: 59 | Admitting: Neurology

## 2019-06-27 ENCOUNTER — Ambulatory Visit (INDEPENDENT_AMBULATORY_CARE_PROVIDER_SITE_OTHER): Payer: 59 | Admitting: Neurology

## 2019-06-27 DIAGNOSIS — R200 Anesthesia of skin: Secondary | ICD-10-CM

## 2019-06-27 DIAGNOSIS — M5030 Other cervical disc degeneration, unspecified cervical region: Secondary | ICD-10-CM

## 2019-06-27 NOTE — Progress Notes (Signed)
IMPRESSION:   Nerve conduction studies done on both upper extremities are  within normal limits, no evidence of a neuropathy is present.   EMG evaluation of the left upper extremity is unremarkable, there  is no evidence of an overlying cervical radiculopathy.  York Spaniel, MD     06/27/2019  2:17 PM

## 2019-06-27 NOTE — Progress Notes (Signed)
Please refer to EMG and nerve conduction procedure note.  

## 2019-06-27 NOTE — Procedures (Signed)
     HISTORY:  [name] Smith is a 54 year old patient with intermittent numbness of the fingertips of the hands bilaterally.  The patient has had 2 prior cervical spine surgeries, she denies any neck pain at this point.  She has not had any symptoms over the last 2 weeks, she comes in for evaluation to exclude a neuropathy or a cervical radiculopathy.  NERVE CONDUCTION STUDIES:  Nerve conduction studies were performed on both upper extremities. The distal motor latencies and motor amplitudes for the median and ulnar nerves were within normal limits. The nerve conduction velocities for these nerves were also normal. The sensory latencies for the median and ulnar nerves were normal. The F wave latencies for the ulnar nerves were within normal limits.   EMG STUDIES:  EMG study was performed on the left upper extremity:  The first dorsal interosseous muscle reveals 2 to 4 K units with full recruitment. No fibrillations or positive waves were noted. The abductor pollicis brevis muscle reveals 2 to 4 K units with full recruitment. No fibrillations or positive waves were noted. The extensor indicis proprius muscle reveals 1 to 3 K units with full recruitment. No fibrillations or positive waves were noted. The biceps muscle reveals 1 to 2 K units with full recruitment. No fibrillations or positive waves were noted. The triceps muscle reveals 2 to 4 K units with full recruitment. No fibrillations or positive waves were noted. The anterior deltoid muscle reveals 2 to 3 K units with full recruitment. No fibrillations or positive waves were noted. The cervical paraspinal muscles were tested at 2 levels. No abnormalities of insertional activity were seen at either level tested. There was poor relaxation.  IMPRESSION:  Nerve conduction studies done on both upper extremities are within normal limits, no evidence of a neuropathy is present.  EMG evaluation of the left upper extremity is unremarkable,  there is no evidence of an overlying cervical radiculopathy.  Marlan Palau MD 06/27/2019 2:10 PM  [facility] [address] [place], [place] [address]  Phone [phone] Fax [phone]

## 2019-06-27 NOTE — Progress Notes (Signed)
MNC    Nerve / Sites Muscle Latency Ref. Amplitude Ref. Rel Amp Segments Distance Velocity Ref. Area    ms ms mV mV %  cm m/s m/s mVms  R Median - APB     Wrist APB 3.1 ?4.4 5.9 ?4.0 100 Wrist - APB 7   26.6     Upper arm APB 7.0  5.2  87.6 Upper arm - Wrist 22 58 ?49 23.2  L Median - APB     Wrist APB 2.9 ?4.4 9.4 ?4.0 100 Wrist - APB 7   41.8     Upper arm APB 6.3  9.2  97.6 Upper arm - Wrist 21 61 ?49 41.1  R Ulnar - ADM     Wrist ADM 2.5 ?3.3 8.6 ?6.0 100 Wrist - ADM 7   37.3     B.Elbow ADM 5.7  8.2  95.4 B.Elbow - Wrist 21 65 ?49 36.6     A.Elbow ADM 7.1  8.5  103 A.Elbow - B.Elbow 10 72 ?49 37.1  L Ulnar - ADM     Wrist ADM 2.3 ?3.3 7.1 ?6.0 100 Wrist - ADM 7   34.4     B.Elbow ADM 5.3  7.4  104 B.Elbow - Wrist 20 67 ?49 34.6     A.Elbow ADM 6.7  7.1  96.7 A.Elbow - B.Elbow 10 72 ?49 34.2             SNC    Nerve / Sites Rec. Site Peak Lat Ref.  Amp Ref. Segments Distance Peak Diff Ref.    ms ms V V  cm ms ms  R Median - Orthodromic (Dig II, Mid palm)     Dig II Wrist 2.8 ?3.4 21 ?10 Dig II - Wrist 13    L Median - Orthodromic (Dig II, Mid palm)     Dig II Wrist 2.6 ?3.4 17 ?10 Dig II - Wrist 13    R Ulnar - Orthodromic, (Dig V, Mid palm)     Dig V Wrist 2.6 ?3.1 10 ?5 Dig V - Wrist 11    L Ulnar - Orthodromic, (Dig V, Mid palm)     Dig V Wrist 2.5 ?3.1 10 ?5 Dig V - Wrist 75               F  Wave    Nerve F Lat Ref.   ms ms  R Ulnar - ADM 27.8 ?32.0  L Ulnar - ADM 27.4 ?32.0

## 2019-06-28 ENCOUNTER — Other Ambulatory Visit: Payer: 59

## 2019-06-29 ENCOUNTER — Ambulatory Visit: Payer: 59

## 2019-06-29 ENCOUNTER — Other Ambulatory Visit: Payer: Self-pay

## 2019-06-29 DIAGNOSIS — R202 Paresthesia of skin: Secondary | ICD-10-CM

## 2019-06-29 DIAGNOSIS — R200 Anesthesia of skin: Secondary | ICD-10-CM

## 2019-06-29 DIAGNOSIS — M5030 Other cervical disc degeneration, unspecified cervical region: Secondary | ICD-10-CM | POA: Diagnosis not present

## 2019-06-29 MED ORDER — GADOBENATE DIMEGLUMINE 529 MG/ML IV SOLN
15.0000 mL | Freq: Once | INTRAVENOUS | Status: AC | PRN
  Administered 2019-06-29: 15 mL via INTRAVENOUS

## 2019-06-30 ENCOUNTER — Telehealth: Payer: Self-pay | Admitting: Neurology

## 2019-06-30 NOTE — Telephone Encounter (Signed)
-----   Message from Melvyn Novas, MD sent at 06/27/2019  5:33 PM EDT ----- IMPRESSION:   Nerve conduction studies done on both upper extremities are  within normal limits, no evidence of a neuropathy is present.   EMG evaluation of the left upper extremity is unremarkable, there  is no evidence of an overlying cervical radiculopathy.  York Spaniel, MD     06/27/2019  2:17 PM

## 2019-06-30 NOTE — Telephone Encounter (Signed)
Called the pt and reviewed the NCV. Advised the findings were normal and there was nothing that appeared concerning. She stated that she got the MRI completed yesterday. Pt asked if MRI normal no need to call, if abnormal then she would like a call. Informed her that I would reach out if MRI was abnormal. Pt verbalized understanding. Pt had no questions at this time but was encouraged to call back if questions arise.

## 2019-07-01 NOTE — Progress Notes (Signed)
Absolutely normal for age spine-  c5-c6 bulging disc, mild bi-foraminal stenosis

## 2019-07-04 ENCOUNTER — Telehealth: Payer: Self-pay | Admitting: Neurology

## 2019-07-04 NOTE — Telephone Encounter (Signed)
-----   Message from Melvyn Novas, MD sent at 07/01/2019 12:28 PM EDT ----- Absolutely normal for age spine-  c5-c6 bulging disc, mild bi-foraminal stenosis

## 2019-07-04 NOTE — Telephone Encounter (Signed)
Called the patient to discuss the results of the MRI cervical spine. Pt verbalized understanding of results and had no further questions. Pt has a scheduled follow up in July and will keep that for now.

## 2019-07-19 ENCOUNTER — Other Ambulatory Visit: Payer: Self-pay

## 2019-07-19 ENCOUNTER — Ambulatory Visit: Payer: 59 | Admitting: Adult Health Nurse Practitioner

## 2019-07-19 ENCOUNTER — Encounter: Payer: Self-pay | Admitting: Adult Health Nurse Practitioner

## 2019-07-19 VITALS — BP 128/90 | HR 63 | Temp 97.5°F | Wt 159.0 lb

## 2019-07-19 DIAGNOSIS — R161 Splenomegaly, not elsewhere classified: Secondary | ICD-10-CM | POA: Diagnosis not present

## 2019-07-19 DIAGNOSIS — J00 Acute nasopharyngitis [common cold]: Secondary | ICD-10-CM

## 2019-07-19 DIAGNOSIS — R197 Diarrhea, unspecified: Secondary | ICD-10-CM | POA: Diagnosis not present

## 2019-07-19 DIAGNOSIS — Z7189 Other specified counseling: Secondary | ICD-10-CM

## 2019-07-19 NOTE — Progress Notes (Signed)
Assessment and Plan:  [name] Smith was seen today for acute visit.  Diagnoses and all orders for this visit:  Acute rhinitis Treat symptoms Try OTC antihistamine, zrtec nightly Increase water intake Saline nasal spray for irritation  Diarrhea, unspecified type Increase water, gatorade intake Bland diet, BRAT Printed education provided Continue to monitor Increase in episodes, stool sample?  Splenomegaly -     CBC with Differential/Platelet -     COMPLETE METABOLIC PANEL WITH GFR -     Epstein-Barr virus VCA antibody panel Labs to rule out epstein-barr Continue to monitor Consider ABD ultrasound?  Counseled about COVID-19 virus infection Discussed going to testing related to caring for elderly parents at risk [facility] Contact office with results Discussed treatment would be same, quarantine would differ Contact office with any new or worsening symptoms    Further disposition pending results of labs. Discussed med's effects and SE's.   Over 30 minutes of face to face interview, exam, counseling, chart review, and critical decision making was performed.   Future Appointments  Date Time Provider Department Center  09/21/2019  2:15 PM Glean Salvo, NP [facility] None  01/05/2020  2:00 PM Quentin Mulling, PA-C [facility] None    ------------------------------------------------------------------------------------------------------------------     HPI 54 y.o.female presents for evaluation for congestion, runny nose, little bit of a sore throat and a dry cough that started one week ago.  She has been taking daytime Every times she eats she is having diarrhea within .  She reports she had crackers.  She had a scrambled egg and one piece of toast and then has.  Today she has had one granola bar and few crackers that has stayed down.  She does report nausea.  She is able to keep down water, ice tea, some soda today.  She is reports some cervical neck  pain.  Reports she has had this since she had neurological testing but it has increased slightly since she has not been feeling well. She does follow with pain management at this time.  She reports that she  Take car of her elderly parents.  She is concerned she is not feeling better.  Reports a family member form out of town is helping with this right now but will be heading back home in a few days.   Past Medical History:  Diagnosis Date  . Allergy   . Anemia   . Arthritis    in hands  . DDD (degenerative disc disease)   . Headache   . Hyperlipidemia    diet controlled  . Hypertension   . Neuropathy   . PONV (postoperative nausea and vomiting)   . Unspecified vitamin D deficiency      Allergies  Allergen Reactions  . Erythromycin Hives  . Penicillins Hives  . Prednisone Other (See Comments)    pain  . Tetracyclines & Related Hives    Current Outpatient Medications on File Prior to Visit  Medication Sig  . aspirin-acetaminophen-caffeine (EXCEDRIN MIGRAINE) 250-250-65 MG tablet Take by mouth every 6 (six) hours as needed for headache.  . Cetirizine HCl (ZYRTEC PO) Take 1 tablet by mouth daily. Allertec brand  . Cholecalciferol (VITAMIN D) 2000 UNITS CAPS Take 6,000 Units by mouth daily.   . Cyanocobalamin 2500 MCG TABS Take by mouth.  Marland Kitchen HYDROcodone-acetaminophen (NORCO/VICODIN) 5-325 MG tablet Take 1 tablet by mouth every 6 (six) hours as needed for moderate pain.  . metoprolol succinate (TOPROL-XL) 25 MG 24 hr tablet TAKE 1/2  TABLET BY MOUTH DAILY (Patient taking differently: 12.5 mg. )  . Misc Natural Products (GLUCOSAMINE CHONDROITIN ADV PO) Take 1 tablet by mouth daily. Move free   No current facility-administered medications on file prior to visit.    ROS: all negative except above.   Physical Exam:  BP 128/90   Pulse 63   Temp (!) 97.5 F (36.4 C)   Wt 159 lb (72.1 kg)   SpO2 98%   BMI 26.46 kg/m   General Appearance: Well nourished, in no apparent  distress. Eyes: PERRLA, EOMs, conjunctiva no swelling or erythema Sinuses: No Frontal/maxillary tenderness ENT/Mouth: Ext aud canals clear, TMs without erythema, bulging. Erythema to bilateral nasal cavities. No erythema, swelling, or exudate on post pharynx.  Tonsils not swollen or erythematous. Hearing normal.  Neck: Supple, thyroid normal.  Respiratory: Respiratory effort normal, BS equal bilaterally without rales, rhonchi, wheezing or stridor.  Cardio: RRR with no MRGs. Brisk peripheral pulses without edema.  Abdomen: Soft, + BS. no guarding, rebound, hernias, masses. Tender to LUQ, splenomegaly noted. Lymphatics: Non tender without lymphadenopathy.  Musculoskeletal: Full ROM, 5/5 strength, normal gait.  Skin: Warm, dry without rashes, lesions, ecchymosis.  Neuro: Cranial nerves intact. Normal muscle tone, no cerebellar symptoms. Sensation intact.  Psych: Awake and oriented X 3, normal affect, Insight and Judgment appropriate.     Garnet Sierras, NP 12:27 PM [facility]

## 2019-07-19 NOTE — Patient Instructions (Addendum)
   Take an antihistamine daily, Zyrtec (citirazine).  Allegra (Fexofenadine)  Xyzal (Levocirizine)   Eat blands foods, toast rice, applesauce, bananas.  Stay hydrated.   [facility]  This is important as it could change your actions when caring for your parents.  Please call office to let us know results if you get them [facility] a [facility].  Also contact us if you have any new or worsening symptoms.

## 2019-07-20 ENCOUNTER — Telehealth: Payer: Self-pay | Admitting: Physician Assistant

## 2019-07-20 DIAGNOSIS — U071 COVID-19: Secondary | ICD-10-CM

## 2019-07-20 LAB — EPSTEIN-BARR VIRUS VCA ANTIBODY PANEL
EBV NA IgG: 148 U/mL — ABNORMAL HIGH
EBV VCA IgG: >750 U/mL — ABNORMAL HIGH
EBV VCA IgM: <36 U/mL

## 2019-07-20 LAB — CBC WITH DIFFERENTIAL/PLATELET
Absolute Monocytes: 306 cells/uL (ref 200–950)
Basophils Absolute: 21 cells/uL (ref 0–200)
Basophils Relative: 0.7 %
Eosinophils Absolute: 9 cells/uL — ABNORMAL LOW (ref 15–500)
Eosinophils Relative: 0.3 %
HCT: 38.8 % (ref 35.0–45.0)
Hemoglobin: 13.1 g/dL (ref 11.7–15.5)
Lymphs Abs: 858 cells/uL (ref 850–3900)
MCH: 29.4 pg (ref 27.0–33.0)
MCHC: 33.8 g/dL (ref 32.0–36.0)
MCV: 87 fL (ref 80.0–100.0)
MPV: 9.8 fL (ref 7.5–12.5)
Monocytes Relative: 10.2 %
Neutro Abs: 1806 cells/uL (ref 1500–7800)
Neutrophils Relative %: 60.2 %
Platelets: 251 10*3/uL (ref 140–400)
RBC: 4.46 10*6/uL (ref 3.80–5.10)
RDW: 12 % (ref 11.0–15.0)
Total Lymphocyte: 28.6 %
WBC: 3 10*3/uL — ABNORMAL LOW (ref 3.8–10.8)

## 2019-07-20 LAB — COMPLETE METABOLIC PANEL WITH GFR
AG Ratio: 2 (calc) (ref 1.0–2.5)
ALT: 12 U/L (ref 6–29)
AST: 16 U/L (ref 10–35)
Albumin: 4.7 g/dL (ref 3.6–5.1)
Alkaline phosphatase (APISO): 82 U/L (ref 37–153)
BUN: 13 mg/dL (ref 7–25)
CO2: 27 mmol/L (ref 20–32)
Calcium: 9.3 mg/dL (ref 8.6–10.4)
Chloride: 100 mmol/L (ref 98–110)
Creat: 0.72 mg/dL (ref 0.50–1.05)
GFR, Est African American: 108 mL/min/{1.73_m2} (ref >=60)
GFR, Est Non African American: 94 mL/min/{1.73_m2} (ref >=60)
Globulin: 2.4 g/dL (calc) (ref 1.9–3.7)
Glucose, Bld: 102 mg/dL — ABNORMAL HIGH (ref 65–99)
Potassium: 4.3 mmol/L (ref 3.5–5.3)
Sodium: 135 mmol/L (ref 135–146)
Total Bilirubin: 0.3 mg/dL (ref 0.2–1.2)
Total Protein: 7.1 g/dL (ref 6.1–8.1)

## 2019-07-20 NOTE — Telephone Encounter (Signed)
    History of Present Illness: 54 y.o. WF with+ COVID , symptoms x Wednesday. Dry cough, fatigue, fever just today.   Assessment and Plan: Sign up for my chart and will send information Stay home for 10 days Quarantine husband  Follow Up Instructions:   I discussed the assessment and treatment plan with the patient. The patient was provided an opportunity to ask questions and all were answered. The patient agreed with the plan and demonstrated an understanding of the instructions.   The patient was advised to call back or seek an in-person evaluation if the symptoms worsen or if the condition fails to improve as anticipated.  I provided 10 minutes of non-face-to-face time during this encounter.   Quentin Mulling, PA-C

## 2019-07-20 NOTE — Telephone Encounter (Signed)
-----   Message from Gregery Na, CMA sent at 07/20/2019  2:30 PM EDT ----- Regarding: office note/pt call Contact: [phone] COVID (+)   Per patient call: she would like to know what to do & how does her husband need to do as well. He is going to be teaching next.

## 2019-07-21 ENCOUNTER — Encounter: Payer: Self-pay | Admitting: Adult Health Nurse Practitioner

## 2019-07-22 ENCOUNTER — Other Ambulatory Visit: Payer: Self-pay | Admitting: Adult Health

## 2019-07-22 ENCOUNTER — Other Ambulatory Visit: Payer: Self-pay | Admitting: *Deleted

## 2019-07-22 MED ORDER — ONDANSETRON 8 MG PO TBDP: 8.0000 mg | ORAL_TABLET | Freq: Three times a day (TID) | ORAL | 0 refills | Status: DC | PRN

## 2019-07-22 NOTE — Telephone Encounter (Signed)
Patient reported positive Covid test at [facility] 07/20/2019.

## 2019-07-22 NOTE — Progress Notes (Signed)
Patient recently confirmed with with covid 19 called to report new sx of nausea/vomiting/diarrhea. Sent in zofran ODT q8r, advised push fluids, alternate with electrolyte supplemented drink such as Pedialyte. Present to ED if headache, weakness, dizziness, confusion, decreased urine output for labs and IV hydration.

## 2019-08-09 ENCOUNTER — Other Ambulatory Visit: Payer: Self-pay | Admitting: Cardiovascular Disease

## 2019-09-21 ENCOUNTER — Ambulatory Visit: Payer: 59 | Admitting: Neurology

## 2019-11-09 NOTE — Progress Notes (Signed)
Subjective:    Patient ID: [name] Smith, female    DOB: 1965/11/07, 54 y.o.   MRN: [id number]  HPI 54 y.o. WF with history of depression, anxiety, MVP, history of positive COVID in May presents with hair loss.   She is on a B12 but admits to not taking it daily.   Hair is from the root, not in patches, all over.  No fever, no chills, no rashes, no joint swelling,no diarrhea.  She is on biotin.   Blood pressure 126/88, pulse 71, temperature (!) 97.5 F (36.4 C), resp. rate 12, weight 155 lb (70.3 kg), SpO2 99 %.   Lab Results  Component Value Date   VITAMINB12 312 12/17/2016   Lab Results  Component Value Date   IRON 89 07/14/2017   TIBC 307 07/14/2017   FERRITIN 158 11/28/2015   She is on biotin at this time.  Lab Results  Component Value Date   TSH 0.74 01/05/2019    Medications   Current Outpatient Medications (Cardiovascular):    metoprolol succinate (TOPROL-XL) 25 MG 24 hr tablet, TAKE 1/2 TABLET BY MOUTH DAILY  Current Outpatient Medications (Respiratory):    Cetirizine HCl (ZYRTEC PO), Take 1 tablet by mouth daily. Allertec brand  Current Outpatient Medications (Analgesics):    aspirin-acetaminophen-caffeine (EXCEDRIN MIGRAINE) 250-250-65 MG tablet, Take by mouth every 6 (six) hours as needed for headache.   HYDROcodone-acetaminophen (NORCO/VICODIN) 5-325 MG tablet, Take 1 tablet by mouth every 6 (six) hours as needed for moderate pain.  Current Outpatient Medications (Hematological):    Cyanocobalamin 2500 MCG TABS, Take by mouth.  Current Outpatient Medications (Other):    Cholecalciferol (VITAMIN D) 2000 UNITS CAPS, Take 6,000 Units by mouth daily.    Misc Natural Products (GLUCOSAMINE CHONDROITIN ADV PO), Take 1 tablet by mouth daily. Move free   ondansetron (ZOFRAN ODT) 8 MG disintegrating tablet, Take 1 tablet (8 mg total) by mouth every 8 (eight) hours as needed for nausea or vomiting.  Problem list She has Hyperlipidemia; Anemia; Vitamin  D deficiency; DDD (degenerative disc disease), cervical; Depression; Generalized anxiety disorder; Osteopenia; Essential hypertension; Insomnia secondary to chronic pain; Spinal stenosis in cervical region; PVC's (premature ventricular contractions); Mitral valve prolapse; Palpitations; Recurrent major depressive disorder, in partial remission (HCC); and Lab test positive for detection of COVID-19 virus on their problem list.   Review of Systems See HPI    Objective:   Physical Exam Constitutional:      Appearance: She is well-developed.  HENT:     Head: Normocephalic and atraumatic.     Right Ear: External ear normal.     Left Ear: External ear normal.  Eyes:     Conjunctiva/sclera: Conjunctivae normal.     Pupils: Pupils are equal, round, and reactive to light.  Neck:     Thyroid: No thyromegaly.  Cardiovascular:     Rate and Rhythm: Normal rate and regular rhythm.     Heart sounds: Normal heart sounds. No murmur heard.  No friction rub. No gallop.   Pulmonary:     Effort: Pulmonary effort is normal. No respiratory distress.     Breath sounds: Normal breath sounds. No wheezing.  Abdominal:     General: Bowel sounds are normal. There is no distension.     Palpations: Abdomen is soft. There is no mass.     Tenderness: There is no abdominal tenderness. There is no guarding or rebound.  Musculoskeletal:        General: Normal range of motion.  Cervical back: Normal range of motion and neck supple.  Lymphadenopathy:     Cervical: No cervical adenopathy.  Skin:    General: Skin is warm and dry.  Neurological:     Mental Status: She is alert and oriented to person, place, and time.     Cranial Nerves: No cranial nerve deficit.     Coordination: Coordination normal.     Deep Tendon Reflexes: Reflexes normal.         Assessment & Plan:    Hair loss Likely due to telogen effluvium from COVID in May Will get off biotin x 2 weeks and rechecl labs to rule out  thyroid/def -     CBC with Differential/Platelet -     COMPLETE METABOLIC PANEL WITH GFR -     TSH -     Iron,Total/Total Iron Binding Cap -     Ferritin -     Vitamin B12  Vitamin D deficiency -     VITAMIN D 25 Hydroxy (Vit-D Deficiency, Fractures)     Future Appointments  Date Time Provider Department Center  12/05/2019  3:00 PM Nahser, Deloris Ping, MD [facility]  01/16/2020  2:00 PM Quentin Mulling, PA-C [facility] None

## 2019-11-10 ENCOUNTER — Other Ambulatory Visit: Payer: Self-pay

## 2019-11-10 ENCOUNTER — Ambulatory Visit: Payer: 59 | Admitting: Physician Assistant

## 2019-11-10 ENCOUNTER — Encounter: Payer: Self-pay | Admitting: Physician Assistant

## 2019-11-10 VITALS — BP 126/88 | HR 71 | Temp 97.5°F | Resp 12 | Wt 155.0 lb

## 2019-11-10 DIAGNOSIS — Z79899 Other long term (current) drug therapy: Secondary | ICD-10-CM

## 2019-11-10 DIAGNOSIS — L659 Nonscarring hair loss, unspecified: Secondary | ICD-10-CM

## 2019-11-10 DIAGNOSIS — E559 Vitamin D deficiency, unspecified: Secondary | ICD-10-CM

## 2019-11-10 DIAGNOSIS — D649 Anemia, unspecified: Secondary | ICD-10-CM | POA: Diagnosis not present

## 2019-11-10 NOTE — Patient Instructions (Addendum)
  telogen effluvium- can check this out on google This will get better  Will check labs in 2 weeks.   GENERAL HEALTH GOALS  Know what a healthy weight is for you (roughly BMI <25) and aim to maintain this  Aim for 7+ servings of fruits and vegetables daily  70-80+ fluid ounces of water or unsweet tea for healthy kidneys  Limit to max 1 drink of alcohol per day; avoid smoking/tobacco  Limit animal fats in diet for cholesterol and heart health - choose grass fed whenever available  Avoid highly processed foods, and foods high in saturated/trans fats  Aim for low stress - take time to unwind and care for your mental health  Aim for 150 min of moderate intensity exercise weekly for heart health, and weights twice weekly for bone health  Aim for 7-9 hours of sleep daily

## 2019-11-24 ENCOUNTER — Other Ambulatory Visit: Payer: 59

## 2019-11-24 ENCOUNTER — Other Ambulatory Visit: Payer: Self-pay

## 2019-11-24 LAB — COMPLETE METABOLIC PANEL WITH GFR
AG Ratio: 2.1 (calc) (ref 1.0–2.5)
ALT: 7 U/L (ref 6–29)
AST: 13 U/L (ref 10–35)
Albumin: 4.7 g/dL (ref 3.6–5.1)
Alkaline phosphatase (APISO): 80 U/L (ref 37–153)
BUN: 16 mg/dL (ref 7–25)
CO2: 29 mmol/L (ref 20–32)
Calcium: 9.9 mg/dL (ref 8.6–10.4)
Chloride: 106 mmol/L (ref 98–110)
Creat: 0.66 mg/dL (ref 0.50–1.05)
GFR, Est African American: 114 mL/min/{1.73_m2} (ref >=60)
GFR, Est Non African American: 99 mL/min/{1.73_m2} (ref >=60)
Globulin: 2.2 g/dL (calc) (ref 1.9–3.7)
Glucose, Bld: 93 mg/dL (ref 65–99)
Potassium: 4.3 mmol/L (ref 3.5–5.3)
Sodium: 143 mmol/L (ref 135–146)
Total Bilirubin: 0.3 mg/dL (ref 0.2–1.2)
Total Protein: 6.9 g/dL (ref 6.1–8.1)

## 2019-11-24 LAB — CBC WITH DIFFERENTIAL/PLATELET
Absolute Monocytes: 317 cells/uL (ref 200–950)
Basophils Absolute: 68 cells/uL (ref 0–200)
Basophils Relative: 1.3 %
Eosinophils Absolute: 161 cells/uL (ref 15–500)
Eosinophils Relative: 3.1 %
HCT: 37.8 % (ref 35.0–45.0)
Hemoglobin: 12.4 g/dL (ref 11.7–15.5)
Lymphs Abs: 1986 cells/uL (ref 850–3900)
MCH: 29.4 pg (ref 27.0–33.0)
MCHC: 32.8 g/dL (ref 32.0–36.0)
MCV: 89.6 fL (ref 80.0–100.0)
MPV: 10.2 fL (ref 7.5–12.5)
Monocytes Relative: 6.1 %
Neutro Abs: 2668 cells/uL (ref 1500–7800)
Neutrophils Relative %: 51.3 %
Platelets: 297 10*3/uL (ref 140–400)
RBC: 4.22 10*6/uL (ref 3.80–5.10)
RDW: 12.8 % (ref 11.0–15.0)
Total Lymphocyte: 38.2 %
WBC: 5.2 10*3/uL (ref 3.8–10.8)

## 2019-11-24 LAB — IRON, TOTAL/TOTAL IRON BINDING CAP
%SAT: 27 % (calc) (ref 16–45)
Iron: 91 ug/dL (ref 45–160)
TIBC: 332 mcg/dL (calc) (ref 250–450)

## 2019-11-24 LAB — FERRITIN: Ferritin: 111 ng/mL (ref 16–232)

## 2019-11-24 LAB — MAGNESIUM: Magnesium: 2.2 mg/dL (ref 1.5–2.5)

## 2019-11-24 LAB — VITAMIN B12: Vitamin B-12: 832 pg/mL (ref 200–1100)

## 2019-11-24 LAB — VITAMIN D 25 HYDROXY (VIT D DEFICIENCY, FRACTURES): Vit D, 25-Hydroxy: 52 ng/mL (ref 30–100)

## 2019-11-24 LAB — TSH: TSH: 0.88 mIU/L (ref 0.40–4.50)

## 2019-12-04 ENCOUNTER — Encounter: Payer: Self-pay | Admitting: Cardiovascular Disease

## 2019-12-04 NOTE — Progress Notes (Signed)
This encounter was created in error - please disregard.

## 2019-12-05 ENCOUNTER — Encounter: Payer: 59 | Admitting: Cardiovascular Disease

## 2020-01-01 NOTE — Progress Notes (Deleted)
Assessment and Plan:  There are no diagnoses linked to this encounter.    Further disposition pending results of labs. Discussed med's effects and SE's.   Over 30 minutes of exam, counseling, chart review, and critical decision making was performed.   Future Appointments  Date Time Provider Department Center  01/02/2020  1:45 PM Elder Negus, NP [facility] None  01/16/2020  2:00 PM Judd Gaudier, NP [facility] None    ------------------------------------------------------------------------------------------------------------------   HPI 54 y.o.female presents for  Past Medical History:  Diagnosis Date  . Allergy   . Anemia   . Arthritis    in hands  . DDD (degenerative disc disease)   . Headache   . Hyperlipidemia    diet controlled  . Hypertension   . Neuropathy   . PONV (postoperative nausea and vomiting)   . Unspecified vitamin D deficiency      Allergies  Allergen Reactions  . Erythromycin Hives  . Penicillins Hives  . Prednisone Other (See Comments)    pain  . Tetracyclines & Related Hives    Current Outpatient Medications on File Prior to Visit  Medication Sig  . aspirin-acetaminophen-caffeine (EXCEDRIN MIGRAINE) 250-250-65 MG tablet Take by mouth every 6 (six) hours as needed for headache.  . Cetirizine HCl (ZYRTEC PO) Take 1 tablet by mouth daily. Allertec brand  . Cholecalciferol (VITAMIN D) 2000 UNITS CAPS Take 6,000 Units by mouth daily.   . Cyanocobalamin 2500 MCG TABS Take by mouth.  Marland Kitchen HYDROcodone-acetaminophen (NORCO/VICODIN) 5-325 MG tablet Take 1 tablet by mouth every 6 (six) hours as needed for moderate pain.  . metoprolol succinate (TOPROL-XL) 25 MG 24 hr tablet TAKE 1/2 TABLET BY MOUTH DAILY  . Misc Natural Products (GLUCOSAMINE CHONDROITIN ADV PO) Take 1 tablet by mouth daily. Move free  . ondansetron (ZOFRAN ODT) 8 MG disintegrating tablet Take 1 tablet (8 mg total) by mouth every 8 (eight) hours as needed for nausea or vomiting.   No  current facility-administered medications on file prior to visit.    ROS: all negative except above.   Physical Exam:  There were no vitals taken for this visit.  General Appearance: Well nourished, in no apparent distress. Eyes: PERRLA, EOMs, conjunctiva no swelling or erythema Sinuses: No Frontal/maxillary tenderness ENT/Mouth: Ext aud canals clear, TMs without erythema, bulging. No erythema, swelling, or exudate on post pharynx.  Tonsils not swollen or erythematous. Hearing normal.  Neck: Supple, thyroid normal.  Respiratory: Respiratory effort normal, BS equal bilaterally without rales, rhonchi, wheezing or stridor.  Cardio: RRR with no MRGs. Brisk peripheral pulses without edema.  Abdomen: Soft, + BS.  Non tender, no guarding, rebound, hernias, masses. Lymphatics: Non tender without lymphadenopathy.  Musculoskeletal: Full ROM, 5/5 strength, normal gait.  Skin: Warm, dry without rashes, lesions, ecchymosis.  Neuro: Cranial nerves intact. Normal muscle tone, no cerebellar symptoms. Sensation intact.  Psych: Awake and oriented X 3, normal affect, Insight and Judgment appropriate.     Elder Negus, NP 11:25 PM [facility]

## 2020-01-02 ENCOUNTER — Ambulatory Visit: Payer: 59 | Admitting: Adult Health Nurse Practitioner

## 2020-01-02 ENCOUNTER — Encounter: Payer: Self-pay | Admitting: Adult Health

## 2020-01-02 ENCOUNTER — Other Ambulatory Visit: Payer: Self-pay

## 2020-01-02 ENCOUNTER — Ambulatory Visit: Payer: 59 | Admitting: Adult Health

## 2020-01-02 VITALS — BP 146/92 | HR 63 | Temp 97.7°F | Ht 65.0 in | Wt 153.8 lb

## 2020-01-02 DIAGNOSIS — F430 Acute stress reaction: Secondary | ICD-10-CM | POA: Diagnosis not present

## 2020-01-02 DIAGNOSIS — F411 Generalized anxiety disorder: Secondary | ICD-10-CM

## 2020-01-02 MED ORDER — ESCITALOPRAM OXALATE 10 MG PO TABS: ORAL_TABLET | ORAL | 0 refills | Status: DC

## 2020-01-02 NOTE — Patient Instructions (Addendum)
Check insurance card- cognitive behavioral therapy   Alzheimers support group -   Imodium as needed if mild diarrhea   Start 1/2 tab lexapro for first 2 weeks, then whole tab if tolerating     Escitalopram tablets What is this medicine? ESCITALOPRAM (es sye TAL oh pram) is used to treat depression and certain types of anxiety. This medicine may be used for other purposes; ask your health care provider or pharmacist if you have questions. COMMON BRAND NAME(S): Lexapro What should I tell my health care provider before I take this medicine? They need to know if you have any of these conditions:  bipolar disorder or a family history of bipolar disorder  diabetes  glaucoma  heart disease  kidney or liver disease  receiving electroconvulsive therapy  seizures (convulsions)  suicidal thoughts, plans, or attempt by you or a family member  an unusual or allergic reaction to escitalopram, the related drug citalopram, other medicines, foods, dyes, or preservatives  pregnant or trying to become pregnant  breast-feeding How should I use this medicine? Take this medicine by mouth with a glass of water. Follow the directions on the prescription label. You can take it with or without food. If it upsets your stomach, take it with food. Take your medicine at regular intervals. Do not take it more often than directed. Do not stop taking this medicine suddenly except upon the advice of your doctor. Stopping this medicine too quickly may cause serious side effects or your condition may worsen. A special MedGuide will be given to you by the pharmacist with each prescription and refill. Be sure to read this information carefully each time. Talk to your pediatrician regarding the use of this medicine in children. Special care may be needed. Overdosage: If you think you have taken too much of this medicine contact a poison control center or emergency room at once. NOTE: This medicine is only  for you. Do not share this medicine with others. What if I miss a dose? If you miss a dose, take it as soon as you can. If it is almost time for your next dose, take only that dose. Do not take double or extra doses. What may interact with this medicine? Do not take this medicine with any of the following medications:  certain medicines for fungal infections like fluconazole, itraconazole, ketoconazole, posaconazole, voriconazole  cisapride  citalopram  dronedarone  linezolid  MAOIs like Carbex, Eldepryl, Marplan, Nardil, and Parnate  methylene blue (injected into a vein)  pimozide  thioridazine This medicine may also interact with the following medications:  alcohol  amphetamines  aspirin and aspirin-like medicines  carbamazepine  certain medicines for depression, anxiety, or psychotic disturbances  certain medicines for migraine headache like almotriptan, eletriptan, frovatriptan, naratriptan, rizatriptan, sumatriptan, zolmitriptan  certain medicines for sleep  certain medicines that treat or prevent blood clots like warfarin, enoxaparin, dalteparin  cimetidine  diuretics  dofetilide  fentanyl  furazolidone  isoniazid  lithium  metoprolol  NSAIDs, medicines for pain and inflammation, like ibuprofen or naproxen  other medicines that prolong the QT interval (cause an abnormal heart rhythm)  procarbazine  rasagiline  supplements like St. John's wort, kava kava, valerian  tramadol  tryptophan  ziprasidone This list may not describe all possible interactions. Give your health care provider a list of all the medicines, herbs, non-prescription drugs, or dietary supplements you use. Also tell them if you smoke, drink alcohol, or use illegal drugs. Some items may interact with your medicine.  What should I watch for while using this medicine? Tell your doctor if your symptoms do not get better or if they get worse. Visit your doctor or health care  professional for regular checks on your progress. Because it may take several weeks to see the full effects of this medicine, it is important to continue your treatment as prescribed by your doctor. Patients and their families should watch out for new or worsening thoughts of suicide or depression. Also watch out for sudden changes in feelings such as feeling anxious, agitated, panicky, irritable, hostile, aggressive, impulsive, severely restless, overly excited and hyperactive, or not being able to sleep. If this happens, especially at the beginning of treatment or after a change in dose, call your health care professional. Bonita Quin may get drowsy or dizzy. Do not drive, use machinery, or do anything that needs mental alertness until you know how this medicine affects you. Do not stand or sit up quickly, especially if you are an older patient. This reduces the risk of dizzy or fainting spells. Alcohol may interfere with the effect of this medicine. Avoid alcoholic drinks. Your mouth may get dry. Chewing sugarless gum or sucking hard candy, and drinking plenty of water may help. Contact your doctor if the problem does not go away or is severe. What side effects may I notice from receiving this medicine? Side effects that you should report to your doctor or health care professional as soon as possible:  allergic reactions like skin rash, itching or hives, swelling of the face, lips, or tongue  anxious  black, tarry stools  changes in vision  confusion  elevated mood, decreased need for sleep, racing thoughts, impulsive behavior  eye pain  fast, irregular heartbeat  feeling faint or lightheaded, falls  feeling agitated, angry, or irritable  hallucination, loss of contact with reality  loss of balance or coordination  loss of memory  painful or prolonged erections  restlessness, pacing, inability to keep still  seizures  stiff muscles  suicidal thoughts or other mood  changes  trouble sleeping  unusual bleeding or bruising  unusually weak or tired  vomiting Side effects that usually do not require medical attention (report to your doctor or health care professional if they continue or are bothersome):  changes in appetite  change in sex drive or performance  headache  increased sweating  indigestion, nausea  tremors This list may not describe all possible side effects. Call your doctor for medical advice about side effects. You may report side effects to FDA at 1-800-FDA-1088. Where should I keep my medicine? Keep out of reach of children. Store at room temperature between 15 and 30 degrees C (59 and 86 degrees F). Throw away any unused medicine after the expiration date. NOTE: This sheet is a summary. It may not cover all possible information. If you have questions about this medicine, talk to your doctor, pharmacist, or health care provider.  2020 Elsevier/Gold Standard (2018-02-22 11:21:44)

## 2020-01-02 NOTE — Progress Notes (Signed)
Assessment and Plan:  Ia was seen today for anxiety.  Diagnoses and all orders for this visit:  Anxiety as acute reaction to exceptional stress Start new medication as prescribed - start 1/2 tab daily and increase in 2 weeks if tolerating Encouraged alzheimer's support, CBT If severe diarrhea with this agent may consider effexor, buspar, trintellix -  Try adding imodium if mild, may improve Advised not to take xanax concurrently with opioid Stress management techniques discussed, increase water, good sleep hygiene discussed, increase exercise, and increase veggies.  Follow up 1 month, call the office if any new AE's from medications and we will switch them -     escitalopram (LEXAPRO) 10 MG tablet; Start taking 1/2 tab daily for 2 weeks, then increase to 1 tab daily.  Further disposition pending results of labs. Discussed med's effects and SE's.   Over 30 minutes of exam, counseling, chart review, and critical decision making was performed.   Future Appointments  Date Time Provider Department Center  01/24/2020  2:00 PM McClanahan, Kyra, NP [facility] None    ------------------------------------------------------------------------------------------------------------------   HPI BP (!) 146/92    Pulse 63    Temp 97.7 F (36.5 C)    Ht 5\' 5"  (1.651 m)    Wt 153 lb 12.8 oz (69.8 kg)    SpO2 94%    BMI 25.59 kg/m   54 y.o.female presents for evaluation due to anxiety.   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 He is going to be seeing psych soon.   Patient reports in recent weeks has overwelming anxiety every time phone rings with her parents number. She feels well otherwise, able to relax when in her own home. She has old xanax 0.25 mg tabs from 2019 that  have been helpful. She is not on a daily agent for anxiety. Reports has tried cymbalta, zoloft in the past but had diarrhea.   No alcohol/drug use Sleeps well with norco at night (back needs surgery - postponing until dad's situation is improved as mom can't care for him by herself.  Denies SI/HI  Past Medical History:  Diagnosis Date   Allergy    Anemia    Arthritis    in hands   DDD (degenerative disc disease)    Headache    Hyperlipidemia    diet controlled   Hypertension    Neuropathy    PONV (postoperative nausea and vomiting)    Unspecified vitamin D deficiency      Allergies  Allergen Reactions   Erythromycin Hives   Penicillins Hives   Prednisone Other (See Comments)    pain   Tetracyclines & Related Hives    Current Outpatient Medications on File Prior to Visit  Medication Sig   aspirin-acetaminophen-caffeine (EXCEDRIN MIGRAINE) 250-250-65 MG tablet Take by mouth every 6 (six) hours as needed for headache.   Cetirizine HCl (ZYRTEC PO) Take 1 tablet by mouth daily. Allertec brand   Cholecalciferol (VITAMIN D) 2000 UNITS CAPS Take 6,000 Units by mouth daily.    Cyanocobalamin 2500 MCG TABS Take by mouth.   HYDROcodone-acetaminophen (NORCO/VICODIN) 5-325 MG tablet Take 1 tablet by mouth every 6 (six) hours as needed for moderate pain.   metoprolol succinate (TOPROL-XL) 25 MG 24 hr tablet TAKE 1/2 TABLET BY MOUTH DAILY  Misc Natural Products (GLUCOSAMINE CHONDROITIN ADV PO) Take 1 tablet by mouth daily. Move free   ondansetron (ZOFRAN ODT) 8 MG disintegrating tablet Take 1 tablet (8 mg total) by mouth every 8 (eight) hours as needed for nausea or vomiting. (Patient not taking: Reported on 01/02/2020)   No current facility-administered medications on file prior to visit.    ROS: all negative except above.   Physical Exam:  BP (!) 146/92    Pulse 63    Temp 97.7 F (36.5 C)    Ht 5\' 5"  (1.651 m)    Wt 153 lb 12.8 oz (69.8 kg)    SpO2 94%     BMI 25.59 kg/m   General Appearance: Well nourished, in no apparent distress. Eyes: PERRLA, EOMs, conjunctiva no swelling or erythema ENT/Mouth: Ext aud canals clear, TMs without erythema, bulging. No erythema, swelling, or exudate on post pharynx.  Tonsils not swollen or erythematous. Hearing normal.  Neck: Supple, thyroid normal.  Respiratory: Respiratory effort normal, BS equal bilaterally without rales, rhonchi, wheezing or stridor.  Cardio: RRR with no MRGs. Brisk peripheral pulses without edema.  Abdomen: Soft, + BS.   Lymphatics: Non tender without lymphadenopathy.  Musculoskeletal: normal gait.  Skin: Warm, dry without rashes, lesions, ecchymosis.  Neuro: Cranial nerves intact. Normal muscle tone, no cerebellar symptoms.  Psych: Awake and oriented X 3, anxious/mildly tearful affect, Insight and Judgment appropriate.     , NP 2:07 PM [facility]

## 2020-01-05 ENCOUNTER — Encounter: Payer: 59 | Admitting: Physician Assistant

## 2020-01-05 ENCOUNTER — Telehealth: Payer: Self-pay

## 2020-01-05 ENCOUNTER — Other Ambulatory Visit: Payer: Self-pay | Admitting: Adult Health

## 2020-01-05 MED ORDER — VENLAFAXINE HCL ER 37.5 MG PO CP24: 37.5000 mg | ORAL_CAPSULE | Freq: Every day | ORAL | 0 refills | Status: DC

## 2020-01-05 NOTE — Telephone Encounter (Signed)
Patient aware.

## 2020-01-05 NOTE — Telephone Encounter (Signed)
Patient states that she is not tolerating the Lexapro well, feeling very jittery, constant headache and other symptoms. Please advise.

## 2020-01-16 ENCOUNTER — Encounter: Payer: 59 | Admitting: Adult Health

## 2020-01-24 ENCOUNTER — Other Ambulatory Visit: Payer: Self-pay

## 2020-01-24 ENCOUNTER — Other Ambulatory Visit: Payer: Self-pay | Admitting: Adult Health

## 2020-01-24 ENCOUNTER — Encounter: Payer: Self-pay | Admitting: Adult Health

## 2020-01-24 ENCOUNTER — Ambulatory Visit (INDEPENDENT_AMBULATORY_CARE_PROVIDER_SITE_OTHER): Payer: 59 | Admitting: Adult Health Nurse Practitioner

## 2020-01-24 ENCOUNTER — Encounter: Payer: Self-pay | Admitting: Adult Health Nurse Practitioner

## 2020-01-24 VITALS — BP 124/64 | HR 70 | Temp 97.6°F | Ht 65.0 in | Wt 151.0 lb

## 2020-01-24 DIAGNOSIS — F3341 Major depressive disorder, recurrent, in partial remission: Secondary | ICD-10-CM

## 2020-01-24 DIAGNOSIS — I10 Essential (primary) hypertension: Secondary | ICD-10-CM

## 2020-01-24 DIAGNOSIS — Z0000 Encounter for general adult medical examination without abnormal findings: Secondary | ICD-10-CM | POA: Diagnosis not present

## 2020-01-24 DIAGNOSIS — G43109 Migraine with aura, not intractable, without status migrainosus: Secondary | ICD-10-CM

## 2020-01-24 DIAGNOSIS — F411 Generalized anxiety disorder: Secondary | ICD-10-CM

## 2020-01-24 DIAGNOSIS — Z136 Encounter for screening for cardiovascular disorders: Secondary | ICD-10-CM | POA: Diagnosis not present

## 2020-01-24 DIAGNOSIS — M5030 Other cervical disc degeneration, unspecified cervical region: Secondary | ICD-10-CM

## 2020-01-24 DIAGNOSIS — M8580 Other specified disorders of bone density and structure, unspecified site: Secondary | ICD-10-CM

## 2020-01-24 DIAGNOSIS — R197 Diarrhea, unspecified: Secondary | ICD-10-CM

## 2020-01-24 DIAGNOSIS — Z23 Encounter for immunization: Secondary | ICD-10-CM

## 2020-01-24 DIAGNOSIS — Z0001 Encounter for general adult medical examination with abnormal findings: Secondary | ICD-10-CM

## 2020-01-24 DIAGNOSIS — R7309 Other abnormal glucose: Secondary | ICD-10-CM

## 2020-01-24 DIAGNOSIS — E559 Vitamin D deficiency, unspecified: Secondary | ICD-10-CM

## 2020-01-24 DIAGNOSIS — E785 Hyperlipidemia, unspecified: Secondary | ICD-10-CM

## 2020-01-24 DIAGNOSIS — Z1211 Encounter for screening for malignant neoplasm of colon: Secondary | ICD-10-CM

## 2020-01-24 NOTE — Patient Instructions (Addendum)
You received your influenza vaccination today 01/24/20  We placed an order an order for cologuard today.  Please contact your insurance for a formulary OR what medications they prefer for migraine prevention.  Nurtec, this dissolves under your tongue.  You can use this at onset of migraine.  You can also take this every other day as a preventative.   We will call you with lab results from today.  Continue to monitor movement of lower lip and let us know if this continues or worsen.   GENERAL HEALTH GOALS  Know what a healthy weight is for you (roughly BMI <25) and aim to maintain this  Aim for 7+ servings of fruits and vegetables daily  70-80+ fluid ounces of water or unsweet tea for healthy kidneys  Limit to max 1 drink of alcohol per day; avoid smoking/tobacco  Limit animal fats in diet for cholesterol and heart health - choose grass fed whenever available  Avoid highly processed foods, and foods high in saturated/trans fats  Aim for low stress - take time to unwind and care for your mental health  Aim for 150 min of moderate intensity exercise weekly for heart health, and weights twice weekly for bone health  Aim for 7-9 hours of sleep daily

## 2020-01-24 NOTE — Progress Notes (Signed)
COMPLETE PHYSICAL  Assessment and Plan:   [name] Smith was seen today for annual exam.  Diagnoses and all orders for this visit:  Encounter for general adult medical examination with abnormal findings Yearly  Essential hypertension Continue current medications:Metoprolol SR 25mg , half tablet daily. Monitor blood pressure at home; call if consistently over 130/80 Continue DASH diet.   Reminder to go to the ER if any CP, SOB, nausea, dizziness, severe HA, changes vision/speech, left arm numbness and tingling and jaw pain. -     CBC with Differential/Platelet -     COMPLETE METABOLIC PANEL WITH GFR -     TSH -     Magnesium -     EKG 12-Lead  Hyperlipidemia, unspecified hyperlipidemia type Discussed dietary and exercise modifications Low fat diet -     Lipid panel  Vitamin D deficiency Continue supplementation to maintain goal of 70-100 Taking Vitamin D 6,000 IU daily -     VITAMIN D 25 Hydroxy (Vit-D Deficiency, Fractures)  Abnormal glucose Discussed dietary and exercise modifications -     Hemoglobin A1c  Recurrent major depressive disorder, in partial remission (HCC) Generalized anxiety disorder Continue medications: Venlafaxine 37.5mg  Discussed stress management techniques  Discussed, increase water,intake & good sleep hygiene  Discussed increasing exercise & vegetables in diet  Needs flu shot -     FLU VACCINE MDCK QUAD W/Preservative  Screening for colon cancer -     Cologuard, order placed today  Diarrhea, unspecified type Discussed metamucil or probiotic Continue to monitor   Osteopenia, unspecified location  DDD (degenerative disc disease), cervical Managing at this time Has seen [name] in past.  Migraine with aura and without status migrainosus, not intractable-     Rimegepant Sulfate (NURTEC) 75 MG TBDP; Take 75 mg by mouth as needed (migraine). - can not tolerate imitrex due to palpitations/heart issues, given sample and RX for nurtec Discussed  contacting insurance for coverage, let [name] Smith know.    Discussed med's effects and SE's. Screening labs and tests as requested with regular follow-up as recommended. Over 45 minutes of face to face interview, exam, counseling, chart review, and complex, high level critical decision making was performed this visit.   HPI 54 y.o. female  presents for a complete physical.  She is under a lot of stress with her parents and she is getting migraine and currently about 4 a month with duration of 24hours to 3 days. Will have diarrhea and nausea with it. Frontal migraine, sensitive to light, occ with occular issues.   She has tried sumatriptan and did not tolerate related to increased palpitations.  CT head 2012 MRI brain 2015 Sleep study 2017- negative   Her blood pressure has been controlled at home, today their BP is BP: 124/64  She does not workout due to chronic pain of back/neck.  She denies chest pain, shortness of breath, dizziness.  BMI is Body mass index is 25.13 kg/m., she is working on diet and exercise. Wt Readings from Last 3 Encounters:  01/24/20 151 lb (68.5 kg)  01/02/20 153 lb 12.8 oz (69.8 kg)  11/10/19 155 lb (70.3 kg)   She follows with [name]. 11/12/19 for palpitations follow yearly.  Upcoming   She is not on cholesterol medication and denies myalgias. Her cholesterol is at goal. The cholesterol last visit was:   Lab Results  Component Value Date   CHOL 201 (H) 01/05/2019   HDL 51 01/05/2019   LDLCALC 134 (H) 01/05/2019   TRIG 65 01/05/2019  CHOLHDL 3.9 01/05/2019    Last A1C in the office was:  Lab Results  Component Value Date   HGBA1C 5.3 01/05/2019   Patient is on Vitamin D supplement, she is on 6000 IU daily.   Lab Results  Component Value Date   VD25OH 52 11/24/2019     Following with [name]. Jennette Kettle, GYN, she is off the estrogen patch.  She follows with pain management and is on hydrocodone for that.  She is on xanax AS needed, very low dose 0.25mg , last refill  was 08/09/2017 for 60 and she states she still has some.   Current Medications:  Current Outpatient Medications on File Prior to Visit  Medication Sig  . aspirin-acetaminophen-caffeine (EXCEDRIN MIGRAINE) 250-250-65 MG tablet Take by mouth every 6 (six) hours as needed for headache.  . Cetirizine HCl (ZYRTEC PO) Take 1 tablet by mouth daily. Allertec brand  . Cholecalciferol (VITAMIN D) 2000 UNITS CAPS Take 6,000 Units by mouth daily.   . Cyanocobalamin 2500 MCG TABS Take by mouth.  Marland Kitchen HYDROcodone-acetaminophen (NORCO/VICODIN) 5-325 MG tablet Take 1 tablet by mouth every 6 (six) hours as needed for moderate pain.  . metoprolol succinate (TOPROL-XL) 25 MG 24 hr tablet TAKE 1/2 TABLET BY MOUTH DAILY  . Misc Natural Products (GLUCOSAMINE CHONDROITIN ADV PO) Take 1 tablet by mouth daily. Move free  . venlafaxine XR (EFFEXOR XR) 37.5 MG 24 hr capsule Take 1 capsule (37.5 mg total) by mouth daily.   No current facility-administered medications on file prior to visit.    Health Maintenance:   Immunization History  Administered Date(s) Administered  . Influenza Inj Mdck Quad With Preservative 01/24/2020  . Tdap 10/22/2011   Tetanus: 2013 Pneumovax: N/A Prevnar 13: due age 36 Flu vaccine: 01/24/20 Zostavax: N/A  Pap: 2017 get with [name]. Jennette Kettle  [facility]: 10/2017  gets with [name]. Jennette Kettle, DUE DEXA: 2017 Osteopenia [name]. Jennette Kettle  DUE Colonoscopy: overdue Cologuard ordered today CXR 11/2015 MRI cervical 2015 MRI brain 2015 Sleep study 2017 Echo 02/2016  Eye: [name]. Emily Smith, Due for 2021 Dental Exam complete 2021 : Patient Care Team: [name] Cowboy, MD as PCP - General (Internal Medicine) Nahser, Deloris Ping, MD as PCP - Cardiology (Cardiology) [name] Smith, OD as Referring Physician (Optometry) [name] Finner, MD as Consulting Physician (Obstetrics and Gynecology) [name] Harder, MD as Consulting Physician (Dermatology) [name] Elizabeth, MD as Consulting Physician (Gastroenterology)  Medical History:  Past  Medical History:  Diagnosis Date  . Allergy   . Anemia   . Arthritis    in hands  . DDD (degenerative disc disease)   . Headache   . Hyperlipidemia    diet controlled  . Hypertension   . Neuropathy   . PONV (postoperative nausea and vomiting)   . Unspecified vitamin D deficiency    Allergies Allergies  Allergen Reactions  . Erythromycin Hives  . Penicillins Hives  . Prednisone Other (See Comments)    pain  . Tetracyclines & Related Hives  . Lexapro [Escitalopram] Anxiety    Jittery, headache    SURGICAL HISTORY She  has a past surgical history that includes Diagnostic laparoscopy; Abdominal hysterectomy; Tonsillectomy; Cervical disc surgery; Anterior cervical decomp/discectomy fusion (03/29/2012); and Cesarean section. FAMILY HISTORY Her family history includes Atrial fibrillation in her mother; Cancer in her father and maternal grandfather; Diabetes in her father and maternal aunt; Heart disease in her father; Hyperlipidemia in her father and mother; Hypertension in her father and mother. SOCIAL HISTORY She  reports that she quit smoking about  8 years ago. She quit after 3.00 years of use. She has never used smokeless tobacco. She reports that she does not drink alcohol and does not use drugs.  Review of Systems  Constitutional: Negative for chills, diaphoresis, fever, malaise/fatigue and weight loss.  HENT: Negative for congestion, ear discharge, ear pain, hearing loss, nosebleeds, sinus pain, sore throat and tinnitus.   Eyes: Negative for blurred vision, double vision, photophobia, pain, discharge and redness.  Respiratory: Negative for cough, hemoptysis, sputum production, shortness of breath, wheezing and stridor.   Cardiovascular: Negative for chest pain, palpitations, orthopnea, claudication, leg swelling and PND.  Gastrointestinal: Positive for diarrhea. Negative for abdominal pain, blood in stool, constipation, heartburn, melena, nausea and vomiting.  Genitourinary:  Negative for dysuria, flank pain, frequency, hematuria and urgency.  Musculoskeletal: Negative for back pain, falls, joint pain, myalgias and neck pain.  Skin: Negative for itching and rash.  Neurological: Negative for dizziness, tingling, tremors, sensory change, speech change, focal weakness, seizures, loss of consciousness, weakness and headaches.  Endo/Heme/Allergies: Negative for environmental allergies and polydipsia. Does not bruise/bleed easily.  Psychiatric/Behavioral: Negative for depression, hallucinations, memory loss, substance abuse and suicidal ideas. The patient is not nervous/anxious and does not have insomnia.     Physical Exam: Estimated body mass index is 25.13 kg/m as calculated from the following:   Height as of this encounter: 5\' 5"  (1.651 m).   Weight as of this encounter: 151 lb (68.5 kg). BP 124/64   Pulse 70   Temp 97.6 F (36.4 C)   Ht 5\' 5"  (1.651 m)   Wt 151 lb (68.5 kg)   SpO2 98%   BMI 25.13 kg/m  General Appearance: Well nourished, in no apparent distress. Eyes: PERRLA, EOMs, conjunctiva no swelling or erythema, normal fundi and vessels. Sinuses: No Frontal/maxillary tenderness ENT/Mouth: Ext aud canals clear, normal light reflex with TMs without erythema, bulging.  Good dentition. No erythema, swelling, or exudate on post pharynx. Tonsils not swollen or erythematous. Hearing normal.  Neck: Supple, thyroid with some nodules and possible very slight goiter. No bruits Respiratory: Respiratory effort normal, BS equal bilaterally without rales, rhonchi, wheezing or stridor. Cardio: RRR without murmurs, rubs or gallops. Brisk peripheral pulses without edema.  Chest: symmetric, with normal excursions and percussion. Breasts: defer Abdomen: Soft, +BS, no guarding, hernias, masses, or organomegaly. .  Lymphatics: Non tender without lymphadenopathy.  Genitourinary: defer Musculoskeletal: Full ROM all peripheral extremities,5/5 strength, and normal  gait. Skin: Warm, dry without rashes, lesions, ecchymosis.  Neuro: Cranial nerves intact, reflexes equal bilaterally. Normal muscle tone, no cerebellar symptoms. Sensation intact.  Psych: Awake and oriented X 3, normal affect, Insight and Judgment appropriate.   EKG: NSR, No ST changes  , DNP [facility] 01/24/2020  3:05 PM

## 2020-01-25 LAB — CBC WITH DIFFERENTIAL/PLATELET
Absolute Monocytes: 292 cells/uL (ref 200–950)
Basophils Absolute: 59 cells/uL (ref 0–200)
Basophils Relative: 1.1 %
Eosinophils Absolute: 189 cells/uL (ref 15–500)
Eosinophils Relative: 3.5 %
HCT: 37.6 % (ref 35.0–45.0)
Hemoglobin: 12.7 g/dL (ref 11.7–15.5)
Lymphs Abs: 1706 cells/uL (ref 850–3900)
MCH: 29.8 pg (ref 27.0–33.0)
MCHC: 33.8 g/dL (ref 32.0–36.0)
MCV: 88.3 fL (ref 80.0–100.0)
MPV: 9.9 fL (ref 7.5–12.5)
Monocytes Relative: 5.4 %
Neutro Abs: 3154 cells/uL (ref 1500–7800)
Neutrophils Relative %: 58.4 %
Platelets: 329 10*3/uL (ref 140–400)
RBC: 4.26 10*6/uL (ref 3.80–5.10)
RDW: 12.2 % (ref 11.0–15.0)
Total Lymphocyte: 31.6 %
WBC: 5.4 10*3/uL (ref 3.8–10.8)

## 2020-01-25 LAB — HEMOGLOBIN A1C
Hgb A1c MFr Bld: 5.3 % of total Hgb (ref <5.7)
Mean Plasma Glucose: 105 (calc)
eAG (mmol/L): 5.8 (calc)

## 2020-01-25 LAB — VITAMIN D 25 HYDROXY (VIT D DEFICIENCY, FRACTURES): Vit D, 25-Hydroxy: 38 ng/mL (ref 30–100)

## 2020-01-25 LAB — COMPLETE METABOLIC PANEL WITH GFR
AG Ratio: 1.8 (calc) (ref 1.0–2.5)
ALT: 5 U/L — ABNORMAL LOW (ref 6–29)
AST: 12 U/L (ref 10–35)
Albumin: 4.6 g/dL (ref 3.6–5.1)
Alkaline phosphatase (APISO): 84 U/L (ref 37–153)
BUN: 15 mg/dL (ref 7–25)
CO2: 27 mmol/L (ref 20–32)
Calcium: 10 mg/dL (ref 8.6–10.4)
Chloride: 104 mmol/L (ref 98–110)
Creat: 0.72 mg/dL (ref 0.50–1.05)
GFR, Est African American: 108 mL/min/{1.73_m2} (ref >=60)
GFR, Est Non African American: 94 mL/min/{1.73_m2} (ref >=60)
Globulin: 2.5 g/dL (calc) (ref 1.9–3.7)
Glucose, Bld: 91 mg/dL (ref 65–99)
Potassium: 4.7 mmol/L (ref 3.5–5.3)
Sodium: 140 mmol/L (ref 135–146)
Total Bilirubin: 0.3 mg/dL (ref 0.2–1.2)
Total Protein: 7.1 g/dL (ref 6.1–8.1)

## 2020-01-25 LAB — LIPID PANEL
Cholesterol: 195 mg/dL (ref <200)
HDL: 48 mg/dL — ABNORMAL LOW (ref >=50)
LDL Cholesterol (Calc): 130 mg/dL (calc) — ABNORMAL HIGH
Non-HDL Cholesterol (Calc): 147 mg/dL (calc) — ABNORMAL HIGH (ref <130)
Total CHOL/HDL Ratio: 4.1 (calc) (ref <5.0)
Triglycerides: 78 mg/dL (ref <150)

## 2020-01-25 LAB — TSH: TSH: 0.65 mIU/L (ref 0.40–4.50)

## 2020-01-25 LAB — MAGNESIUM: Magnesium: 2.2 mg/dL (ref 1.5–2.5)

## 2020-01-25 NOTE — Progress Notes (Signed)
Patient is aware of lab results and has scheduled a 3 month follow up. She wants to work on diet and exercise and try to get her LDL down on her own and re-evaluate in February. -e welch

## 2020-01-26 ENCOUNTER — Other Ambulatory Visit: Payer: Self-pay | Admitting: Adult Health Nurse Practitioner

## 2020-01-26 DIAGNOSIS — G43109 Migraine with aura, not intractable, without status migrainosus: Secondary | ICD-10-CM

## 2020-01-26 MED ORDER — NURTEC 75 MG PO TBDP: 1.0000 | ORAL_TABLET | ORAL | 1 refills | Status: DC | PRN

## 2020-02-07 ENCOUNTER — Encounter: Payer: Self-pay | Admitting: Cardiovascular Disease

## 2020-02-07 ENCOUNTER — Ambulatory Visit: Payer: 59 | Admitting: Cardiovascular Disease

## 2020-02-07 ENCOUNTER — Other Ambulatory Visit: Payer: Self-pay

## 2020-02-07 VITALS — BP 104/78 | HR 58 | Ht 65.0 in | Wt 151.4 lb

## 2020-02-07 DIAGNOSIS — I493 Ventricular premature depolarization: Secondary | ICD-10-CM | POA: Diagnosis not present

## 2020-02-07 DIAGNOSIS — I341 Nonrheumatic mitral (valve) prolapse: Secondary | ICD-10-CM | POA: Diagnosis not present

## 2020-02-07 MED ORDER — METOPROLOL SUCCINATE ER 25 MG PO TB24: 12.5000 mg | ORAL_TABLET | Freq: Every day | ORAL | 3 refills | Status: DC

## 2020-02-07 NOTE — Progress Notes (Signed)
Cardiology Office Note   Date:  02/07/2020   ID:  [name], DOB October 28, 1965, MRN [id number]  PCP:  Lucky Cowboy, MD  Cardiologist:   Kristeen Miss, MD   Chief Complaint  Patient presents with  . Palpitations   Problem list 1. Palpitations 2. Hyperlipidemia 3. Cervical spine surgery - is on Lyrica for neuropathy pain .     [name] Smith  [facility] ) is a 54 y.o. female who presents for further evaluation of her palpitations.  ( Daughter of [name] Smith )   Has noticed some palpitations on occasion.     Intermittent   It's intermittent and not constant. She's going through menopause. These palpitations have been occurring for the past couple of months. There is no associated syncope or presyncope. There is no chest pain or shortness breath.  Does not get regular exercise .  Just got a new puppy .   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   Feb. 13, 2018:  [name] Smith  was seen several months ago for palpitations. She had an echocardiogram on 02/20/2015 which reveals normal left ventricular systolic function. She has grade 2 diastolic dysfunction.  Has been diagnosed with osteoperosis. She had stopped the hormone patch Her doctor wanted her to ask me about hormone replacement therapy  Aug 04, 2016:  Echo showed normal LV systolic function. Grade 2 diastolic dysfunction,  No MVP We Try Toprol-XL 25 mg a day but this did not agree with her. She is now cutting the Toprol in half and is tolerating 12.5 kg today. Is not exercising   Jul 31, 2017: [name] Smith is seen back today for follow up of her PVCs Still has fatigue ,   Has been found to have some thyroid nodules Has had some weight gain.   Is weaning off her pain meds.  Has lost 7 lbs in the past 2 weeks,   Has really cut her carbs  Is using a recumbant bike on occasion .   Sept. 3, 2020  [name] Smith is seen to follow up for her PVCs.   Wt today is 150 lbs  We started Toprol XL last year.  Trying to work out .    Has had some plantar fasciatis. She has a mid systolic click on exam today   Nov. 23, 2021: [name] Smith is seen today for follow up of her PVCs Has occasional palpitations Does not get much exercise,   Taking care of her father [name] Smith) who is getting dementia.  Had covid back in May.  Has recovered .    Past Medical History:  Diagnosis Date  . Allergy   . Anemia   . Arthritis    in hands  . DDD (degenerative disc disease)   . Headache   . Hyperlipidemia    diet controlled  . Hypertension   . Neuropathy   . PONV (postoperative nausea and vomiting)   . Unspecified vitamin D deficiency     Past Surgical History:  Procedure Laterality Date  . ABDOMINAL HYSTERECTOMY    . ANTERIOR CERVICAL DECOMP/DISCECTOMY FUSION  03/29/2012   Procedure: ANTERIOR CERVICAL DECOMPRESSION/DISCECTOMY FUSION 1 LEVEL;  Surgeon: Temple Pacini, MD;  Location: [facility];  Service: Neurosurgery;  Laterality: Bilateral;  Cervical four-five anterior cervical discectomy with fusion   . CERVICAL DISC SURGERY    . CESAREAN SECTION    . DIAGNOSTIC LAPAROSCOPY    . TONSILLECTOMY       Current Outpatient  Medications  Medication Sig Dispense Refill  . Cetirizine HCl (ZYRTEC PO) Take 1 tablet by mouth daily. Allertec brand    . Cholecalciferol (VITAMIN D) 2000 UNITS CAPS Take 6,000 Units by mouth daily.     . Cyanocobalamin 2500 MCG TABS Take by mouth.    . diazepam (VALIUM) 5 MG tablet Take 5 mg by mouth once.     . gabapentin (NEURONTIN) 300 MG capsule Take 1 capsule by mouth 3 (three) times daily.    Marland Kitchen HYDROcodone-acetaminophen (NORCO/VICODIN) 5-325 MG tablet Take 1 tablet by mouth every 6 (six) hours as needed for moderate pain.    . metoprolol succinate (TOPROL-XL) 25 MG 24 hr tablet Take 0.5 tablets (12.5 mg total) by mouth daily. 45 tablet 3  . Misc Natural Products (GLUCOSAMINE CHONDROITIN ADV PO) Take 1 tablet by mouth daily. Move free    . naproxen (NAPROSYN) 500 MG tablet Take 500 mg by mouth as  needed.     . Rimegepant Sulfate (NURTEC) 75 MG TBDP Take 1 tablet by mouth every other day.    . venlafaxine XR (EFFEXOR XR) 37.5 MG 24 hr capsule Take 1 capsule (37.5 mg total) by mouth daily. 30 capsule 0   No current facility-administered medications for this visit.    Allergies:   Erythromycin, Penicillins, Prednisone, Tetracyclines & related, and Lexapro [escitalopram]   Social History:  The patient  reports that she quit smoking about 8 years ago. She quit after 3.00 years of use. She has never used smokeless tobacco. She reports that she does not drink alcohol and does not use drugs.   Family History:  The patient's family history includes Atrial fibrillation in her mother; Cancer in her father and maternal grandfather; Diabetes in her father and maternal aunt; Heart disease in her father; Hyperlipidemia in her father and mother; Hypertension in her father and mother.    ROS: Noted in current history, otherwise review of systems is negative.   Physical Exam: Blood pressure 104/78, pulse (!) 58, height 5\' 5"  (1.651 m), weight 151 lb 6.4 oz (68.7 kg), SpO2 98 %.  GEN:  Well nourished, well developed in no acute distress HEENT: Normal NECK: No JVD; No carotid bruits LYMPHATICS: No lymphadenopathy CARDIAC: RRR ,  Mid systolic click, no significant murmur  RESPIRATORY:  Clear to auscultation without rales, wheezing or rhonchi  ABDOMEN: Soft, non-tender, non-distended MUSCULOSKELETAL:  No edema; No deformity  SKIN: Warm and dry NEUROLOGIC:  Alert and oriented x 3   EKG:    February 07, 2020: Sinus bradycardia 58 beats minute.  No ST or T wave changes.  Recent Labs: 01/24/2020: ALT 5; BUN 15; Creat 0.72; Hemoglobin 12.7; Magnesium 2.2; Platelets 329; Potassium 4.7; Sodium 140; TSH 0.65    Lipid Panel    Component Value Date/Time   CHOL 195 01/24/2020 1510   TRIG 78 01/24/2020 1510   HDL 48 (L) 01/24/2020 1510   CHOLHDL 4.1 01/24/2020 1510   VLDL 14 11/28/2015 1506    LDLCALC 130 (H) 01/24/2020 1510      Wt Readings from Last 3 Encounters:  02/07/20 151 lb 6.4 oz (68.7 kg)  01/24/20 151 lb (68.5 kg)  01/02/20 153 lb 12.8 oz (69.8 kg)      Other studies Reviewed: Additional studies/ records that were reviewed today include: . Review of the above records demonstrates:    ASSESSMENT AND PLAN:  1.  Palpitations:   She is not having any significant palpitations.  Continue Toprol-XL 12.5 mg a day.  2.  mitral valve prolapse. :  She has mild mitral valve prolapse on exam.  There is no significant mitral valve murmur.  We will continue to follow.   Will see in 1 year.     Current medicines are reviewed at length with the patient today.  The patient does not have concerns regarding medicines.  Labs/ tests ordered today include:   Orders Placed This Encounter  Procedures  . EKG 12-Lead   Disposition:   FU with me in 12 months  Kristeen Miss, MD  02/07/2020 2:26 PM    [facility] [address] [place], [place], [place]  [address] Phone: [phone]; Fax: [phone]

## 2020-02-07 NOTE — Patient Instructions (Signed)
Medication Instructions:  Your provider recommends that you continue on your current medications as directed. Please refer to the Current Medication list given to you today.   *If you need a refill on your cardiac medications before your next appointment, please call your pharmacy*  Follow-Up: At [facility], you and your health needs are our priority.  As part of our continuing mission to provide you with exceptional heart care, we have created designated Provider Care Teams.  These Care Teams include your primary Cardiologist (physician) and Advanced Practice Providers (APPs -  Physician Assistants and Nurse Practitioners) who all work together to provide you with the care you need, when you need it. Your next appointment:   12 month(s) The format for your next appointment:   In Person Provider:   You may see Philip Nahser, MD or one of the following Advanced Practice Providers on your designated Care Team:    Scott Weaver, PA-C  Vin Bhagat, PA-C   

## 2020-02-20 ENCOUNTER — Other Ambulatory Visit: Payer: Self-pay | Admitting: Adult Health

## 2020-04-25 ENCOUNTER — Ambulatory Visit: Payer: 59 | Admitting: Adult Health Nurse Practitioner

## 2020-05-01 ENCOUNTER — Other Ambulatory Visit: Payer: Self-pay

## 2020-05-01 ENCOUNTER — Encounter: Payer: Self-pay | Admitting: Adult Health Nurse Practitioner

## 2020-05-01 ENCOUNTER — Ambulatory Visit: Payer: 59 | Admitting: Adult Health Nurse Practitioner

## 2020-05-01 VITALS — BP 110/72 | HR 88 | Temp 97.6°F | Wt 150.0 lb

## 2020-05-01 DIAGNOSIS — L259 Unspecified contact dermatitis, unspecified cause: Secondary | ICD-10-CM

## 2020-05-01 DIAGNOSIS — R197 Diarrhea, unspecified: Secondary | ICD-10-CM

## 2020-05-01 DIAGNOSIS — F3341 Major depressive disorder, recurrent, in partial remission: Secondary | ICD-10-CM

## 2020-05-01 DIAGNOSIS — Z79899 Other long term (current) drug therapy: Secondary | ICD-10-CM

## 2020-05-01 DIAGNOSIS — F411 Generalized anxiety disorder: Secondary | ICD-10-CM

## 2020-05-01 DIAGNOSIS — R7309 Other abnormal glucose: Secondary | ICD-10-CM | POA: Diagnosis not present

## 2020-05-01 DIAGNOSIS — M8580 Other specified disorders of bone density and structure, unspecified site: Secondary | ICD-10-CM

## 2020-05-01 DIAGNOSIS — M5030 Other cervical disc degeneration, unspecified cervical region: Secondary | ICD-10-CM

## 2020-05-01 DIAGNOSIS — E785 Hyperlipidemia, unspecified: Secondary | ICD-10-CM

## 2020-05-01 DIAGNOSIS — E559 Vitamin D deficiency, unspecified: Secondary | ICD-10-CM

## 2020-05-01 DIAGNOSIS — G43109 Migraine with aura, not intractable, without status migrainosus: Secondary | ICD-10-CM

## 2020-05-01 DIAGNOSIS — Z1159 Encounter for screening for other viral diseases: Secondary | ICD-10-CM

## 2020-05-01 DIAGNOSIS — I10 Essential (primary) hypertension: Secondary | ICD-10-CM

## 2020-05-01 DIAGNOSIS — L209 Atopic dermatitis, unspecified: Secondary | ICD-10-CM

## 2020-05-01 MED ORDER — TRIAMCINOLONE ACETONIDE 0.1 % EX OINT: 1.0000 "application " | TOPICAL_OINTMENT | Freq: Two times a day (BID) | CUTANEOUS | 1 refills | Status: DC

## 2020-05-01 NOTE — Progress Notes (Signed)
FOLLOW UP 3 MONTH  Assessment and Plan:   Diagnoses and all orders for this visit:   Essential hypertension Continue current medications:Metoprolol SR 25mg , half tablet daily. Monitor blood pressure at home; call if consistently over 130/80 Continue DASH diet.   Reminder to go to the ER if any CP, SOB, nausea, dizziness, severe HA, changes vision/speech, left arm numbness and tingling and jaw pain. -     CBC with Differential/Platelet -     COMPLETE METABOLIC PANEL WITH GFR -     TSH -     Magnesium   Hyperlipidemia, unspecified hyperlipidemia type Discussed dietary and exercise modifications Low fat diet -     Lipid panel  Vitamin D deficiency Continue supplementation to maintain goal of 70-100 Taking Vitamin D 6,000 IU daily -     VITAMIN D 25 Hydroxy (Vit-D Deficiency, Fractures)  Abnormal glucose Discussed dietary and exercise modifications -     Hemoglobin A1c  Recurrent major depressive disorder, in partial remission (HCC) Generalized anxiety disorder Continue medications: Venlafaxine 37.5mg  Discussed stress management techniques  Discussed, increase water,intake & good sleep hygiene  Discussed increasing exercise & vegetables in diet   Diarrhea, unspecified type Discussed metamucil or probiotic Continue to monitor   Osteopenia, unspecified location  DDD (degenerative disc disease), cervical Managing at this time Has seen Dr in past.  Migraine with aura and without status migrainosus, not intractable-     Rimegepant Sulfate (NURTEC) 75 MG TBDP; Take 75 mg by mouth as needed (migraine). - can not tolerate imitrex due to palpitations/heart issues, given sample and RX for nurtec  Encounter for Hepatitis C Screening Low risk, one time screening, discussed with patient -Hep C  Atopic Dermatitis Contact likely, unknown exposure Discussed risks and monitoring Rx Triamcinolone topical Continue to monitor   Discussed med's effects and SE's.  Screening labs and tests as requested with regular follow-up as recommended.  Over 30 minutes of face to face interview, exam, counseling, chart review, and complex, high level critical decision making was performed this visit.   HPI 55 y.o. female  presents for three month follow up on HTN, HLD,   She reports she has a rash to the midline left side.  The rash is itchy, non painful, no drainage.  She has taking benedryl with no resolve.  The cortisone topical is helping minimally. She had her hair dyed in Jan. Also has since ben helping care for her parents and [place].  She is under a lot of stress with her parents and she is getting migraine and currently about 4 a month with duration of 24hours to 3 days. Will have diarrhea and nausea with it. Frontal migraine, sensitive to light, occ with occular issues.   She has tried sumatriptan and did not tolerate related to increased palpitations.  CT head 2012 MRI brain 2015 Sleep study 2017- negative   Her blood pressure has been controlled at home, today their BP is BP: 110/72  She does not workout due to chronic pain of back/neck.  She denies chest pain, shortness of breath, dizziness.  She goes to pain management.  BMI is Body mass index is 24.96 kg/m., she is working on diet and exercise. Wt Readings from Last 3 Encounters:  05/01/20 150 lb (68 kg)  02/07/20 151 lb 6.4 oz (68.7 kg)  01/24/20 151 lb (68.5 kg)   She follows with Dr. 13/09/21 for palpitations follow yearly.  Upcoming   She is not  on cholesterol medication and denies myalgias. Her cholesterol is at goal. The cholesterol last visit was:   Lab Results  Component Value Date   CHOL 195 01/24/2020   HDL 48 (L) 01/24/2020   LDLCALC 130 (H) 01/24/2020   TRIG 78 01/24/2020   CHOLHDL 4.1 01/24/2020    Last A1C in the office was:  Lab Results  Component Value Date   HGBA1C 5.3 01/24/2020   Patient is on Vitamin D supplement, she is on 6000 IU  daily.   Lab Results  Component Value Date   VD25OH 38 01/24/2020     Following with Dr. Jennette Kettle, GYN, she is off the estrogen patch.  She follows with pain management and is on hydrocodone for that.  She is on xanax AS needed, very low dose 0.25mg , last refill was 08/09/2017 for 60 and she states she still has some.   Current Medications:  Current Outpatient Medications on File Prior to Visit  Medication Sig  . Cetirizine HCl (ZYRTEC PO) Take 1 tablet by mouth daily. Allertec brand  . Cholecalciferol (VITAMIN D) 2000 UNITS CAPS Take 6,000 Units by mouth daily.   . Cyanocobalamin 2500 MCG TABS Take by mouth.  . diazepam (VALIUM) 5 MG tablet Take 5 mg by mouth once.   . gabapentin (NEURONTIN) 300 MG capsule Take 1 capsule by mouth 3 (three) times daily.  Marland Kitchen HYDROcodone-acetaminophen (NORCO/VICODIN) 5-325 MG tablet Take 1 tablet by mouth every 6 (six) hours as needed for moderate pain.  . metoprolol succinate (TOPROL-XL) 25 MG 24 hr tablet Take 0.5 tablets (12.5 mg total) by mouth daily.  . Misc Natural Products (GLUCOSAMINE CHONDROITIN ADV PO) Take 1 tablet by mouth daily. Move free  . naproxen (NAPROSYN) 500 MG tablet Take 500 mg by mouth as needed.   . Rimegepant Sulfate (NURTEC) 75 MG TBDP Take 1 tablet by mouth every other day.  . venlafaxine XR (EFFEXOR-XR) 37.5 MG 24 hr capsule TAKE 1 CAPSULE BY MOUTH EVERY DAY   No current facility-administered medications on file prior to visit.    Health Maintenance:   Immunization History  Administered Date(s) Administered  . Influenza Inj Mdck Quad With Preservative 01/24/2020  . Tdap 10/22/2011   Tetanus: 2013 Pneumovax: N/A Prevnar 13: due age 31 Flu vaccine: 01/24/20 Zostavax: N/A  Pap: 2017 get with Dr. Jennette Kettle  [facility]: 10/2017  gets with Dr. Jennette Kettle, DUE DEXA: 2017 Osteopenia Dr. Jennette Kettle  DUE Colonoscopy: overdue Cologuard ordered, has not used. CXR 11/2015 MRI cervical 2015 MRI brain 2015 Sleep study 2017 Echo 02/2016  Eye: Dr.  Emily Filbert, Due for 2021 Dental Exam complete 2021 : Patient Care Team: Lucky Cowboy, MD as PCP - General (Internal Medicine) Nahser, Deloris Ping, MD as PCP - Cardiology (Cardiology) Manning Charity, OD as Referring Physician (Optometry) Freddy Finner, MD as Consulting Physician (Obstetrics and Gynecology) Janalyn Harder, MD as Consulting Physician (Dermatology) Charna Elizabeth, MD as Consulting Physician (Gastroenterology)  Medical History:  Past Medical History:  Diagnosis Date  . Allergy   . Anemia   . Arthritis    in hands  . DDD (degenerative disc disease)   . Headache   . Hyperlipidemia    diet controlled  . Hypertension   . Neuropathy   . PONV (postoperative nausea and vomiting)   . Unspecified vitamin D deficiency    Allergies Allergies  Allergen Reactions  . Erythromycin Hives  . Penicillins Hives  . Prednisone Other (See Comments)    pain  . Tetracyclines & Related Hives  .  Lexapro [Escitalopram] Anxiety    Jittery, headache    SURGICAL HISTORY She  has a past surgical history that includes Diagnostic laparoscopy; Abdominal hysterectomy; Tonsillectomy; Cervical disc surgery; Anterior cervical decomp/discectomy fusion (03/29/2012); and Cesarean section. FAMILY HISTORY Her family history includes Atrial fibrillation in her mother; Cancer in her father and maternal grandfather; Diabetes in her father and maternal aunt; Heart disease in her father; Hyperlipidemia in her father and mother; Hypertension in her father and mother. SOCIAL HISTORY She  reports that she quit smoking about 8 years ago. She quit after 3.00 years of use. She has never used smokeless tobacco. She reports that she does not drink alcohol and does not use drugs.  Review of Systems  Constitutional: Negative for chills, diaphoresis, fever, malaise/fatigue and weight loss.  HENT: Negative for congestion, ear discharge, ear pain, hearing loss, nosebleeds, sinus pain, sore throat and tinnitus.   Eyes:  Negative for blurred vision, double vision, photophobia, pain, discharge and redness.  Respiratory: Negative for cough, hemoptysis, sputum production, shortness of breath, wheezing and stridor.   Cardiovascular: Negative for chest pain, palpitations, orthopnea, claudication, leg swelling and PND.  Gastrointestinal: Positive for diarrhea. Negative for abdominal pain, blood in stool, constipation, heartburn, melena, nausea and vomiting.  Genitourinary: Negative for dysuria, flank pain, frequency, hematuria and urgency.  Musculoskeletal: Negative for back pain, falls, joint pain, myalgias and neck pain.  Skin: Negative for itching and rash.  Neurological: Negative for dizziness, tingling, tremors, sensory change, speech change, focal weakness, seizures, loss of consciousness, weakness and headaches.  Endo/Heme/Allergies: Negative for environmental allergies and polydipsia. Does not bruise/bleed easily.  Psychiatric/Behavioral: Negative for depression, hallucinations, memory loss, substance abuse and suicidal ideas. The patient is not nervous/anxious and does not have insomnia.     Physical Exam: Estimated body mass index is 24.96 kg/m as calculated from the following:   Height as of 02/07/20: 5\' 5"  (1.651 m).   Weight as of this encounter: 150 lb (68 kg). BP 110/72   Pulse 88   Temp 97.6 F (36.4 C)   Wt 150 lb (68 kg)   SpO2 98%   BMI 24.96 kg/m  General Appearance: Well nourished, in no apparent distress. Eyes: PERRLA, EOMs, conjunctiva no swelling or erythema, normal fundi and vessels. Sinuses: No Frontal/maxillary tenderness ENT/Mouth: Ext aud canals clear, normal light reflex with TMs without erythema, bulging.  Good dentition. No erythema, swelling, or exudate on post pharynx. Tonsils not swollen or erythematous. Hearing normal.  Neck: Supple, thyroid with some nodules and possible very slight goiter. No bruits Respiratory: Respiratory effort normal, BS equal bilaterally without  rales, rhonchi, wheezing or stridor. Cardio: RRR without murmurs, rubs or gallops. Brisk peripheral pulses without edema.  Abdomen: Soft, +BS, no guarding, hernias, masses, or organomegaly. .  Musculoskeletal: Full ROM all peripheral extremities,5/5 strength, and normal gait. Skin: Warm, dry without rashes, lesions, ecchymosis.  Neuro: Cranial nerves intact, reflexes equal bilaterally. Normal muscle tone, no cerebellar symptoms. Sensation intact.  Psych: Awake and oriented X 3, normal affect, Insight and Judgment appropriate.     , Elder Negus, DNP [facility] 05/01/2020  4:30 PM

## 2020-05-02 LAB — CBC WITH DIFFERENTIAL/PLATELET
Absolute Monocytes: 372 cells/uL (ref 200–950)
Basophils Absolute: 60 cells/uL (ref 0–200)
Basophils Relative: 1 %
Eosinophils Absolute: 132 cells/uL (ref 15–500)
Eosinophils Relative: 2.2 %
HCT: 39.2 % (ref 35.0–45.0)
Hemoglobin: 13.2 g/dL (ref 11.7–15.5)
Lymphs Abs: 1680 cells/uL (ref 850–3900)
MCH: 30.3 pg (ref 27.0–33.0)
MCHC: 33.7 g/dL (ref 32.0–36.0)
MCV: 89.9 fL (ref 80.0–100.0)
MPV: 10.1 fL (ref 7.5–12.5)
Monocytes Relative: 6.2 %
Neutro Abs: 3756 cells/uL (ref 1500–7800)
Neutrophils Relative %: 62.6 %
Platelets: 333 10*3/uL (ref 140–400)
RBC: 4.36 10*6/uL (ref 3.80–5.10)
RDW: 12.3 % (ref 11.0–15.0)
Total Lymphocyte: 28 %
WBC: 6 10*3/uL (ref 3.8–10.8)

## 2020-05-02 LAB — COMPLETE METABOLIC PANEL WITH GFR
AG Ratio: 2.3 (calc) (ref 1.0–2.5)
ALT: 8 U/L (ref 6–29)
AST: 14 U/L (ref 10–35)
Albumin: 4.8 g/dL (ref 3.6–5.1)
Alkaline phosphatase (APISO): 81 U/L (ref 37–153)
BUN: 16 mg/dL (ref 7–25)
CO2: 27 mmol/L (ref 20–32)
Calcium: 9.7 mg/dL (ref 8.6–10.4)
Chloride: 105 mmol/L (ref 98–110)
Creat: 0.75 mg/dL (ref 0.50–1.05)
GFR, Est African American: 103 mL/min/{1.73_m2} (ref >=60)
GFR, Est Non African American: 88 mL/min/{1.73_m2} (ref >=60)
Globulin: 2.1 g/dL (calc) (ref 1.9–3.7)
Glucose, Bld: 73 mg/dL (ref 65–99)
Potassium: 4.5 mmol/L (ref 3.5–5.3)
Sodium: 144 mmol/L (ref 135–146)
Total Bilirubin: 0.4 mg/dL (ref 0.2–1.2)
Total Protein: 6.9 g/dL (ref 6.1–8.1)

## 2020-05-02 LAB — LIPID PANEL
Cholesterol: 183 mg/dL (ref <200)
HDL: 51 mg/dL (ref >=50)
LDL Cholesterol (Calc): 116 mg/dL (calc) — ABNORMAL HIGH
Non-HDL Cholesterol (Calc): 132 mg/dL (calc) — ABNORMAL HIGH (ref <130)
Total CHOL/HDL Ratio: 3.6 (calc) (ref <5.0)
Triglycerides: 72 mg/dL (ref <150)

## 2020-05-02 LAB — HEPATITIS C ANTIBODY
Hepatitis C Ab: NONREACTIVE
SIGNAL TO CUT-OFF: 0.03 (ref <1.00)

## 2020-05-07 DIAGNOSIS — G43109 Migraine with aura, not intractable, without status migrainosus: Secondary | ICD-10-CM | POA: Insufficient documentation

## 2020-05-08 ENCOUNTER — Other Ambulatory Visit: Payer: Self-pay | Admitting: Adult Health Nurse Practitioner

## 2020-08-20 ENCOUNTER — Other Ambulatory Visit: Payer: Self-pay | Admitting: Adult Health Nurse Practitioner

## 2020-11-01 LAB — COLOGUARD: Cologuard: NEGATIVE

## 2020-11-10 LAB — COLOGUARD

## 2020-11-13 ENCOUNTER — Encounter: Payer: Self-pay | Admitting: Internal Medicine

## 2020-11-21 ENCOUNTER — Encounter: Payer: Self-pay | Admitting: Adult Health

## 2020-11-21 ENCOUNTER — Ambulatory Visit: Payer: 59 | Admitting: Adult Health

## 2020-11-21 ENCOUNTER — Other Ambulatory Visit: Payer: Self-pay

## 2020-11-21 VITALS — BP 118/74 | HR 79 | Temp 97.5°F | Wt 157.8 lb

## 2020-11-21 DIAGNOSIS — R1011 Right upper quadrant pain: Secondary | ICD-10-CM

## 2020-11-21 DIAGNOSIS — E041 Nontoxic single thyroid nodule: Secondary | ICD-10-CM

## 2020-11-21 LAB — COLOGUARD: Cologuard: NEGATIVE

## 2020-11-21 NOTE — Progress Notes (Signed)
Assessment and Plan:  [name] Smith was seen today for flank pain.  Diagnoses and all orders for this visit:  RUQ abdominal pain Vague, some guarding; denies nausea or other GI sx Will check labs and [place] for epigastric organs check labs, bland diet, small portions, increase H20, PPI daily x 2 weeks and follow up if this helped Patient advised to go to the ER if the symptoms increase or worsen.   -     CBC with Differential/Platelet -     COMPLETE METABOLIC PANEL WITH GFR -     Lipase -     Urinalysis, Routine w reflex microscopic -     US Abdomen Complete; Future  Thyroid nodule -     US THYROID; Future  Further disposition pending results of labs. Discussed med's effects and SE's.   Over 20 minutes of exam, counseling, chart review, and critical decision making was performed.   Future Appointments  Date Time Provider Department Center  01/23/2021  2:00 PM [name] Humphrey, NP [facility] None    ------------------------------------------------------------------------------------------------------------------   HPI BP 118/74   Pulse 79   Temp (!) 97.5 F (36.4 C)   Wt 157 lb 12.8 oz (71.6 kg)   SpO2 99%   BMI 26.26 kg/m  55 y.o.female presents for evaluation of RUQ pains and "bulging."   She reports several years ago have RUQ tenderness on routine exam, didn't have any sx at the time, but in the last 6+ months has noted more RUQ vague discomfort. She denies atypical nausea. Denies emesis. Denies constipation/diarrhea. Denies blood or mucus in stool. Notably just completed cologuard screening, pending results.   She does note in the past month feels persistently bloated/full, has gained weight. Doesn't correlate with diet/lifestyle changes. Hx of mild intermittent GERD, denies recent sx. Denies NSAID use, excess caffeine, notable alcohol intake.   She is s/p hysterectomy, does have ovaries, follows with GYN Dr. Jennette Kettle, last PAP/pelvic 2018, never abnormal. She has upcoming scheduled to  follow up.   BMI is Body mass index is 26.26 kg/m. Wt Readings from Last 3 Encounters:  11/21/20 157 lb 12.8 oz (71.6 kg)  05/01/20 150 lb (68 kg)  02/07/20 151 lb 6.4 oz (68.7 kg)   On review of previous imaging incidentally noted thyroid nodule noted on [place] in 2019 that was recommended for annual follow up; added to problem list and order placed. Had abd [place] in 12/2016 that was unremarkable other than 5 cm simple cyst of left kidney.   Past Medical History:  Diagnosis Date   Allergy    Anemia    Arthritis    in hands   DDD (degenerative disc disease)    Headache    Hyperlipidemia    diet controlled   Hypertension    Neuropathy    PONV (postoperative nausea and vomiting)    Unspecified vitamin D deficiency      Allergies  Allergen Reactions   Erythromycin Hives   Penicillins Hives   Prednisone Other (See Comments)    pain   Tetracyclines & Related Hives   Lexapro [Escitalopram] Anxiety    Jittery, headache    Current Outpatient Medications on File Prior to Visit  Medication Sig   Cetirizine HCl (ZYRTEC PO) Take 1 tablet by mouth daily. Allertec brand   Cholecalciferol (VITAMIN D) 2000 UNITS CAPS Take 6,000 Units by mouth daily.    Cyanocobalamin 2500 MCG TABS Take by mouth.   HYDROcodone-acetaminophen (NORCO/VICODIN) 5-325 MG tablet Take 1 tablet by mouth  every 6 (six) hours as needed for moderate pain.   metoprolol succinate (TOPROL-XL) 25 MG 24 hr tablet Take 0.5 tablets (12.5 mg total) by mouth daily.   Misc Natural Products (GLUCOSAMINE CHONDROITIN ADV PO) Take 1 tablet by mouth daily. Move free   diazepam (VALIUM) 5 MG tablet Take 5 mg by mouth once.  (Patient not taking: Reported on 11/21/2020)   gabapentin (NEURONTIN) 300 MG capsule Take 1 capsule by mouth 3 (three) times daily. (Patient not taking: Reported on 11/21/2020)   naproxen (NAPROSYN) 500 MG tablet Take 500 mg by mouth as needed.  (Patient not taking: Reported on 11/21/2020)   Rimegepant Sulfate (NURTEC) 75  MG TBDP Take 1 tablet by mouth every other day. (Patient not taking: Reported on 11/21/2020)   triamcinolone ointment (KENALOG) 0.1 % Apply 1 application topically 2 (two) times daily. (Patient not taking: Reported on 11/21/2020)   venlafaxine XR (EFFEXOR-XR) 37.5 MG 24 hr capsule Take  1 capsule  Daily  for Mood (Patient not taking: Reported on 11/21/2020)   No current facility-administered medications on file prior to visit.   Allergies:  Allergies  Allergen Reactions   Erythromycin Hives   Penicillins Hives   Prednisone Other (See Comments)    pain   Tetracyclines & Related Hives   Lexapro [Escitalopram] Anxiety    Jittery, headache   Surgical History:  She  has a past surgical history that includes Diagnostic laparoscopy; Abdominal hysterectomy; Tonsillectomy; Cervical disc surgery; Anterior cervical decomp/discectomy fusion (03/29/2012); and Cesarean section. Family History:  Herfamily history includes Atrial fibrillation in her mother; Cancer in her father and maternal grandfather; Diabetes in her father and maternal aunt; Heart disease in her father; Hyperlipidemia in her father and mother; Hypertension in her father and mother. Social History:   reports that she quit smoking about 9 years ago. Her smoking use included cigarettes. She has never used smokeless tobacco. She reports that she does not drink alcohol and does not use drugs.  ROS: all negative except above.   Physical Exam:  BP 118/74   Pulse 79   Temp (!) 97.5 F (36.4 C)   Wt 157 lb 12.8 oz (71.6 kg)   SpO2 99%   BMI 26.26 kg/m   General Appearance: Well nourished, in no apparent distress. Eyes: PERRLA, conjunctiva no swelling or erythema ENT/Mouth: Mask in place; Hearing normal.  Neck: Supple Respiratory: Respiratory effort normal, BS equal bilaterally without rales, rhonchi, wheezing or stridor.  Cardio: RRR with no MRGs. Brisk peripheral pulses without edema.  Abdomen: Soft, non-distended, + BS x 4.  Mild  epigastric tenderness, more tender RUQ, some guarding without rebound, no palpable hernias, masses. Lymphatics: Non tender without lymphadenopathy.  Musculoskeletal: normal gait.  Skin: Warm, dry without rashes, lesions, ecchymosis.  Neuro: Normal muscle tone Psych: Awake and oriented X 3, mildly anxious affect, Insight and Judgment appropriate.   Dan Maker, NP 4:23 PM [facility]

## 2020-11-21 NOTE — Patient Instructions (Signed)
YOU CAN CALL TO MAKE AN ULTRASOUND..  I have put in an order for an ultrasound for you to have You can set them up at your convenience by calling this number [phone] You will likely have the ultrasound at [address] [facility] [address]  If you have any issues call our office and we will set this up for you.    Order is in for abdominal ultrasound, also for thyroid nodule follow up ultrasound.   If labs are negative, could try nexium over the counter, take 1 tab once daily 30 min prior to breakfast for 2 weeks to see if this helps any symptoms.  Please follow up with me if persistent, new or worsening symptoms  Please also mention bloating/fullness to Dr. Jennette Kettle at your follow up    Abdominal Pain, Adult Pain in the abdomen (abdominal pain) can be caused by many things. Often, abdominal pain is not serious and it gets better with no treatment or by being treated at home. However, sometimes abdominal pain is serious. Your health care provider will ask questions about your medical history and do a physical exam to try to determine the cause of your abdominal pain. Follow these instructions at home: Medicines Take over-the-counter and prescription medicines only as told by your health care provider. Do not take a laxative unless told by your health care provider. General instructions  Watch your condition for any changes. Drink enough fluid to keep your urine pale yellow. Keep all follow-up visits as told by your health care provider. This is important. Contact a health care provider if: Your abdominal pain changes or gets worse. You are not hungry or you lose weight without trying. You are constipated or have diarrhea for more than 2-3 days. You have pain when you urinate or have a bowel movement. Your abdominal pain wakes you up at night. Your pain gets worse with meals, after eating, or with certain foods. You are vomiting and cannot keep anything down. You have a fever. You  have blood in your urine. Get help right away if: Your pain does not go away as soon as your health care provider told you to expect. You cannot stop vomiting. Your pain is only in areas of the abdomen, such as the right side or the left lower portion of the abdomen. Pain on the right side could be caused by appendicitis. You have bloody or black stools, or stools that look like tar. You have severe pain, cramping, or bloating in your abdomen. You have signs of dehydration, such as: Dark urine, very little urine, or no urine. Cracked lips. Dry mouth. Sunken eyes. Sleepiness. Weakness. You have trouble breathing or chest pain. Summary Often, abdominal pain is not serious and it gets better with no treatment or by being treated at home. However, sometimes abdominal pain is serious. Watch your condition for any changes. Take over-the-counter and prescription medicines only as told by your health care provider. Contact a health care provider if your abdominal pain changes or gets worse. Get help right away if you have severe pain, cramping, or bloating in your abdomen. This information is not intended to replace advice given to you by your health care provider. Make sure you discuss any questions you have with your health care provider. Document Revised: 04/22/2019 Document Reviewed: 07/12/2018 Elsevier Patient Education  2022 [employer].

## 2020-11-22 LAB — URINALYSIS, ROUTINE W REFLEX MICROSCOPIC
Bilirubin Urine: NEGATIVE
Glucose, UA: NEGATIVE
Hgb urine dipstick: NEGATIVE
Ketones, ur: NEGATIVE
Leukocytes,Ua: NEGATIVE
Nitrite: NEGATIVE
Protein, ur: NEGATIVE
Specific Gravity, Urine: 1.02 (ref 1.001–1.035)
pH: 5.5 (ref 5.0–8.0)

## 2020-11-22 LAB — LIPASE: Lipase: 30 U/L (ref 7–60)

## 2020-11-22 LAB — CBC WITH DIFFERENTIAL/PLATELET
Absolute Monocytes: 376 cells/uL (ref 200–950)
Basophils Absolute: 57 cells/uL (ref 0–200)
Basophils Relative: 0.8 %
Eosinophils Absolute: 121 cells/uL (ref 15–500)
Eosinophils Relative: 1.7 %
HCT: 42 % (ref 35.0–45.0)
Hemoglobin: 13.6 g/dL (ref 11.7–15.5)
Lymphs Abs: 1953 cells/uL (ref 850–3900)
MCH: 28.9 pg (ref 27.0–33.0)
MCHC: 32.4 g/dL (ref 32.0–36.0)
MCV: 89.4 fL (ref 80.0–100.0)
MPV: 9.8 fL (ref 7.5–12.5)
Monocytes Relative: 5.3 %
Neutro Abs: 4594 cells/uL (ref 1500–7800)
Neutrophils Relative %: 64.7 %
Platelets: 359 10*3/uL (ref 140–400)
RBC: 4.7 10*6/uL (ref 3.80–5.10)
RDW: 12.7 % (ref 11.0–15.0)
Total Lymphocyte: 27.5 %
WBC: 7.1 10*3/uL (ref 3.8–10.8)

## 2020-11-22 LAB — COMPLETE METABOLIC PANEL WITH GFR
AG Ratio: 1.6 (calc) (ref 1.0–2.5)
ALT: 8 U/L (ref 6–29)
AST: 13 U/L (ref 10–35)
Albumin: 4.5 g/dL (ref 3.6–5.1)
Alkaline phosphatase (APISO): 81 U/L (ref 37–153)
BUN: 13 mg/dL (ref 7–25)
CO2: 25 mmol/L (ref 20–32)
Calcium: 10.2 mg/dL (ref 8.6–10.4)
Chloride: 102 mmol/L (ref 98–110)
Creat: 0.72 mg/dL (ref 0.50–1.03)
Globulin: 2.8 g/dL (calc) (ref 1.9–3.7)
Glucose, Bld: 92 mg/dL (ref 65–99)
Potassium: 4 mmol/L (ref 3.5–5.3)
Sodium: 139 mmol/L (ref 135–146)
Total Bilirubin: 0.4 mg/dL (ref 0.2–1.2)
Total Protein: 7.3 g/dL (ref 6.1–8.1)
eGFR: 97 mL/min/{1.73_m2} (ref >=60)

## 2020-11-23 LAB — COLOGUARD: COLOGUARD: NEGATIVE

## 2020-11-29 ENCOUNTER — Ambulatory Visit
Admission: RE | Admit: 2020-11-29 | Discharge: 2020-11-29 | Disposition: A | Discharge status: Home / Self Care | Payer: 59 | Source: Ambulatory Visit | Attending: Adult Health | Admitting: Adult Health

## 2020-11-29 DIAGNOSIS — E041 Nontoxic single thyroid nodule: Secondary | ICD-10-CM

## 2020-11-29 DIAGNOSIS — R1011 Right upper quadrant pain: Secondary | ICD-10-CM

## 2020-12-25 ENCOUNTER — Encounter: Payer: Self-pay | Admitting: Internal Medicine

## 2021-01-16 ENCOUNTER — Telehealth: Payer: Self-pay | Admitting: Cardiovascular Disease

## 2021-01-16 ENCOUNTER — Other Ambulatory Visit: Payer: Self-pay

## 2021-01-16 MED ORDER — METOPROLOL SUCCINATE ER 25 MG PO TB24: 12.5000 mg | ORAL_TABLET | Freq: Every day | ORAL | 3 refills | Status: DC

## 2021-01-16 NOTE — Telephone Encounter (Signed)
*  STAT* If patient is at the pharmacy, call can be transferred to refill team.   1. Which medications need to be refilled? (please list name of each medication and dose if known)  metoprolol succinate (TOPROL-XL) 25 MG 24 hr tablet  2. Which pharmacy/location (including street and city if local pharmacy) is medication to be sent to? [id number] [place], [place] - [address]  3. Do they need a 30 day or 90 day supply?  30 day supply

## 2021-01-23 ENCOUNTER — Encounter: Payer: 59 | Admitting: Nurse Practitioner

## 2021-02-18 NOTE — Progress Notes (Signed)
COMPLETE PHYSICAL  Assessment and Plan:   [name] Smith was seen today for annual exam.  Diagnoses and all orders for this visit:  Encounter for general adult medical examination with abnormal findings Yearly  Essential hypertension Continue current medications:Metoprolol SR 25mg , half tablet daily. Monitor blood pressure at home; call if consistently over 130/80 Continue DASH diet.   Reminder to go to the ER if any CP, SOB, nausea, dizziness, severe HA, changes vision/speech, left arm numbness and tingling and jaw pain. -     CBC with Differential/Platelet -     COMPLETE METABOLIC PANEL WITH GFR -     TSH -     Magnesium -     EKG 12-Lead  Hyperlipidemia, unspecified hyperlipidemia type Discussed dietary and exercise modifications Low fat diet -     Lipid panel  Vitamin D deficiency Continue supplementation to maintain goal of 70-100 Taking Vitamin D 6,000 IU daily -     VITAMIN D 25 Hydroxy (Vit-D Deficiency, Fractures)  Abnormal glucose Discussed dietary and exercise modifications -     Hemoglobin A1c  Recurrent major depressive disorder, in partial remission (HCC) Generalized anxiety disorder Discussed stress management techniques  Discussed, increase water,intake & good sleep hygiene  Discussed increasing exercise & vegetables in diet  Needs flu shot -     FLU VACCINE MDCK QUAD W/Preservative  Screening for ischemic heart disease EKG  Screening for hematuria or proteinuria Routine UA with reflex microscopic Microalbumin/creatinine urine ratio  Diarrhea, unspecified type Discussed metamucil or probiotic Continue to monitor   Osteopenia, unspecified location Has DEXA scheduled for 03/2021  DDD (degenerative disc disease), cervical Managing at this time Has seen Dr Trenton Gammon in past.  Migraine with aura and without status migrainosus, not intractable-      - can not tolerate imitrex due to palpitations/heart issues, given sample and RX for nurtec Was not able to  get insurance to cover Nurtec previously but has failed an SSNRI(effexor) and triptan caused increased palpitations and can not take  Multiple joint pain CRP ESR ANA If positive will refer to rheumatology   Discussed med's effects and SE's. Screening labs and tests as requested with regular follow-up as recommended. Over 45 minutes of face to face interview, exam, counseling, chart review, and complex, high level critical decision making was performed this visit.   HPI 55 y.o. female  presents for a complete physical.  She has tried sumatriptan and did not tolerate related to increased palpitations. Was also on Venlafaxine with no results. Pt is getting 2-4 migraines a month currently. Sensitivity to light/sound/ nausea and diarrhea associated with headaches.   CT head 2012 MRI brain 2015 Sleep study 2017- negative   Her blood pressure has been controlled at home, today their BP is BP: 114/82  She does not workout due to chronic pain of back/neck.  She denies chest pain, shortness of breath, dizziness.  BMI is Body mass index is 26.96 kg/m., she is not working on diet and exercise currently, has increased back pain-lumbar.  Previously used steroid injections but Ortho now states its arthritis not a degeneration issue.  She is also having knees, ankles hands and back pain.   Wt Readings from Last 3 Encounters:  02/19/21 162 lb (73.5 kg)  11/21/20 157 lb 12.8 oz (71.6 kg)  05/01/20 150 lb (68 kg)   She follows with Dr. Cathie Olden for palpitations follow yearly.  Upcoming  1/-2023 She is not on cholesterol medication and denies myalgias. Her cholesterol is at goal.  The cholesterol last visit was:   Lab Results  Component Value Date   CHOL 183 05/01/2020   HDL 51 05/01/2020   LDLCALC 116 (H) 05/01/2020   TRIG 72 05/01/2020   CHOLHDL 3.6 05/01/2020    Last A1C in the office was:  Lab Results  Component Value Date   HGBA1C 5.3 01/24/2020   Patient is on Vitamin D supplement, she  is on 6000 IU daily.   Lab Results  Component Value Date   VD25OH 38 01/24/2020     Following with Dr. Nori Riis, [place], she is off the estrogen patch.  She follows with pain management and is on hydrocodone for that.  She is on xanax AS needed, very low dose 0.$RemoveBe'25mg'ayYntlRxF$ , last refill was 08/09/2017 for 60 and she states she still has some.   Current Medications:  Current Outpatient Medications on File Prior to Visit  Medication Sig   Cetirizine HCl (ZYRTEC PO) Take 1 tablet by mouth daily. Allertec brand   Cholecalciferol (VITAMIN D) 2000 UNITS CAPS Take 6,000 Units by mouth daily.    Cyanocobalamin 2500 MCG TABS Take by mouth.   metoprolol succinate (TOPROL-XL) 25 MG 24 hr tablet Take 0.5 tablets (12.5 mg total) by mouth daily.   HYDROcodone-acetaminophen (NORCO/VICODIN) 5-325 MG tablet Take 1 tablet by mouth every 6 (six) hours as needed for moderate pain. (Patient not taking: Reported on 02/19/2021)   Misc Natural Products (GLUCOSAMINE CHONDROITIN ADV PO) Take 1 tablet by mouth daily. Move free (Patient not taking: Reported on 02/19/2021)   No current facility-administered medications on file prior to visit.    Health Maintenance:   Immunization History  Administered Date(s) Administered   Influenza Inj Mdck Quad With Preservative 01/24/2020   Tdap 10/22/2011   Tetanus: 2013 Pneumovax: N/A Prevnar 13: due age 57 Flu vaccine: 01/24/20 Zostavax: N/A  Pap: 2017 get with Dr. Nori Riis scheduled 03/2021 3DMGM: 10/2017  gets with Dr. Nori Riis, Scheduled 03/2021 DEXA: 2017 Osteopenia Dr. Nori Riis  Scheduled 03/2021 Colonoscopy: Cologuard 11/21/20 Negative CXR 11/2015 MRI cervical 2015 MRI brain 2015 Sleep study 2017 Echo 02/2016  Eye: Dr. Delman Cheadle, Due for 2022 Dental Exam complete 2022 : Patient Care Team: Unk Pinto, MD as PCP - General (Internal Medicine) Nahser, Wonda Cheng, MD as PCP - Cardiology (Cardiology) Melissa Noon, [place] as Referring Physician (Optometry) Maisie Fus, MD as Consulting  Physician (Obstetrics and Gynecology) Lavonna Monarch, MD as Consulting Physician (Dermatology) Juanita Craver, MD as Consulting Physician (Gastroenterology)  Medical History:  Past Medical History:  Diagnosis Date   Allergy    Anemia    Arthritis    in hands   DDD (degenerative disc disease)    Headache    Hyperlipidemia    diet controlled   Hypertension    Neuropathy    PONV (postoperative nausea and vomiting)    Unspecified vitamin D deficiency    Allergies Allergies  Allergen Reactions   Erythromycin Hives   Penicillins Hives   Prednisone Other (See Comments)    pain   Tetracyclines & Related Hives   Lexapro [Escitalopram] Anxiety    Jittery, headache    SURGICAL HISTORY She  has a past surgical history that includes Diagnostic laparoscopy; Abdominal hysterectomy; Tonsillectomy; Cervical disc surgery; Anterior cervical decomp/discectomy fusion (03/29/2012); and Cesarean section. FAMILY HISTORY Her family history includes Atrial fibrillation in her mother; Cancer in her father and maternal grandfather; Diabetes in her father and maternal aunt; Heart disease in her father; Hyperlipidemia in her father and mother; Hypertension in  her father and mother. SOCIAL HISTORY She  reports that she quit smoking about 9 years ago. Her smoking use included cigarettes. She has never used smokeless tobacco. She reports that she does not drink alcohol and does not use drugs.  Review of Systems  Constitutional:  Positive for malaise/fatigue. Negative for chills and fever.  HENT:  Negative for congestion, hearing loss, sinus pain, sore throat and tinnitus.   Eyes:  Positive for blurred vision (with migraine) and photophobia (with migraine). Negative for double vision.  Respiratory:  Negative for cough, hemoptysis, sputum production, shortness of breath and wheezing.   Cardiovascular:  Negative for chest pain, palpitations and leg swelling.  Gastrointestinal:  Negative for abdominal pain,  constipation, diarrhea, heartburn, nausea and vomiting.  Genitourinary:  Negative for dysuria and urgency.  Musculoskeletal:  Positive for back pain and joint pain. Negative for falls, myalgias and neck pain.  Skin:  Negative for rash.  Neurological:  Positive for headaches. Negative for dizziness, tingling, tremors and weakness.  Endo/Heme/Allergies:  Does not bruise/bleed easily.  Psychiatric/Behavioral:  Negative for depression and suicidal ideas. The patient is not nervous/anxious and does not have insomnia.    Physical Exam: Estimated body mass index is 26.96 kg/m as calculated from the following:   Height as of this encounter: _0  (1.651 m).   Weight as of this encounter: 162 lb (73.5 kg). BP 114/82   Pulse 82   Temp 97.7 F (36.5 C)   Ht _1  (1.651 m)   Wt 162 lb (73.5 kg)   SpO2 97%   BMI 26.96 kg/m  General Appearance: Well nourished, in no apparent distress. Eyes: PERRLA, EOMs, conjunctiva no swelling or erythema, normal fundi and vessels. Sinuses: No Frontal/maxillary tenderness ENT/Mouth: Ext aud canals clear, normal light reflex with TMs without erythema, bulging.  Good dentition. No erythema, swelling, or exudate on post pharynx. Tonsils not swollen or erythematous. Hearing normal.  Neck: Supple, thyroid with some nodules and possible very slight goiter. No bruits Respiratory: Respiratory effort normal, BS equal bilaterally without rales, rhonchi, wheezing or stridor. Cardio: RRR without murmurs, rubs or gallops. Brisk peripheral pulses without edema.  Chest: symmetric, with normal excursions and percussion. Breasts: defer Abdomen: Soft, +BS, no guarding, hernias, masses, or organomegaly. .  Lymphatics: Non tender without lymphadenopathy.  Genitourinary: defer Musculoskeletal: Full ROM all peripheral extremities,5/5 strength, and normal gait. Skin: Warm, dry without rashes, lesions, ecchymosis.  Neuro: Cranial nerves intact, reflexes equal bilaterally. Normal  muscle tone, no cerebellar symptoms. Sensation intact.  Psych: Awake and oriented X 3, normal affect, Insight and Judgment appropriate.   EKG: Sinus bradycardia, IRBBB on Metoprolol, No ST changes  [facility]  02/19/2021

## 2021-02-19 ENCOUNTER — Ambulatory Visit (INDEPENDENT_AMBULATORY_CARE_PROVIDER_SITE_OTHER): Payer: 59 | Admitting: Nurse Practitioner

## 2021-02-19 ENCOUNTER — Encounter: Payer: Self-pay | Admitting: Nurse Practitioner

## 2021-02-19 ENCOUNTER — Other Ambulatory Visit: Payer: Self-pay

## 2021-02-19 VITALS — BP 114/82 | HR 82 | Temp 97.7°F | Ht 65.0 in | Wt 162.0 lb

## 2021-02-19 DIAGNOSIS — Z79899 Other long term (current) drug therapy: Secondary | ICD-10-CM

## 2021-02-19 DIAGNOSIS — G43109 Migraine with aura, not intractable, without status migrainosus: Secondary | ICD-10-CM

## 2021-02-19 DIAGNOSIS — Z136 Encounter for screening for cardiovascular disorders: Secondary | ICD-10-CM | POA: Diagnosis not present

## 2021-02-19 DIAGNOSIS — Z0001 Encounter for general adult medical examination with abnormal findings: Secondary | ICD-10-CM

## 2021-02-19 DIAGNOSIS — E785 Hyperlipidemia, unspecified: Secondary | ICD-10-CM

## 2021-02-19 DIAGNOSIS — M2550 Pain in unspecified joint: Secondary | ICD-10-CM

## 2021-02-19 DIAGNOSIS — Z1389 Encounter for screening for other disorder: Secondary | ICD-10-CM

## 2021-02-19 DIAGNOSIS — M8580 Other specified disorders of bone density and structure, unspecified site: Secondary | ICD-10-CM

## 2021-02-19 DIAGNOSIS — M5030 Other cervical disc degeneration, unspecified cervical region: Secondary | ICD-10-CM

## 2021-02-19 DIAGNOSIS — R7309 Other abnormal glucose: Secondary | ICD-10-CM

## 2021-02-19 DIAGNOSIS — I10 Essential (primary) hypertension: Secondary | ICD-10-CM | POA: Diagnosis not present

## 2021-02-19 DIAGNOSIS — E559 Vitamin D deficiency, unspecified: Secondary | ICD-10-CM

## 2021-02-19 DIAGNOSIS — F3341 Major depressive disorder, recurrent, in partial remission: Secondary | ICD-10-CM

## 2021-02-19 DIAGNOSIS — Z23 Encounter for immunization: Secondary | ICD-10-CM

## 2021-02-19 DIAGNOSIS — Z0000 Encounter for general adult medical examination without abnormal findings: Secondary | ICD-10-CM

## 2021-02-19 DIAGNOSIS — F411 Generalized anxiety disorder: Secondary | ICD-10-CM

## 2021-02-19 MED ORDER — NURTEC 75 MG PO TBDP: 1.0000 | ORAL_TABLET | ORAL | 3 refills | Status: DC | PRN

## 2021-02-19 NOTE — Patient Instructions (Signed)
Migraine Headache A migraine headache is an intense, throbbing pain on one side or both sides of the head. Migraine headaches may also cause other symptoms, such as nausea, vomiting, and sensitivity to light and noise. A migraine headache can last from 4 hours to 3 days. Talk with your doctor about what things may bring on (trigger) your migraine headaches. What are the causes? The exact cause of this condition is not known. However, a migraine may be caused when nerves in the brain become irritated and release chemicals that cause inflammation of blood vessels. This inflammation causes pain. This condition may be triggered or caused by: Drinking alcohol. Smoking. Taking medicines, such as: Medicine used to treat chest pain (nitroglycerin). Birth control pills. Estrogen. Certain blood pressure medicines. Eating or drinking products that contain nitrates, glutamate, aspartame, or tyramine. Aged cheeses, chocolate, or caffeine may also be triggers. Doing physical activity. Other things that may trigger a migraine headache include: Menstruation. Pregnancy. Hunger. Stress. Lack of sleep or too much sleep. Weather changes. Fatigue. What increases the risk? The following factors may make you more likely to experience migraine headaches: Being a certain age. This condition is more common in people who are 25-55 years old. Being female. Having a family history of migraine headaches. Being Caucasian. Having a mental health condition, such as depression or anxiety. Being obese. What are the signs or symptoms? The main symptom of this condition is pulsating or throbbing pain. This pain may: Happen in any area of the head, such as on one side or both sides. Interfere with daily activities. Get worse with physical activity. Get worse with exposure to bright lights or loud noises. Other symptoms may include: Nausea. Vomiting. Dizziness. General sensitivity to bright lights, loud noises, or  smells. Before you get a migraine headache, you may get warning signs (an aura). An aura may include: Seeing flashing lights or having blind spots. Seeing bright spots, halos, or zigzag lines. Having tunnel vision or blurred vision. Having numbness or a tingling feeling. Having trouble talking. Having muscle weakness. Some people have symptoms after a migraine headache (postdromal phase), such as: Feeling tired. Difficulty concentrating. How is this diagnosed? A migraine headache can be diagnosed based on: Your symptoms. A physical exam. Tests, such as: CT scan or an MRI of the head. These imaging tests can help rule out other causes of headaches. Taking fluid from the spine (lumbar puncture) and analyzing it (cerebrospinal fluid analysis, or CSF analysis). How is this treated? This condition may be treated with medicines that: Relieve pain. Relieve nausea. Prevent migraine headaches. Treatment for this condition may also include: Acupuncture. Lifestyle changes like avoiding foods that trigger migraine headaches. Biofeedback. Cognitive behavioral therapy. Follow these instructions at home: Medicines Take over-the-counter and prescription medicines only as told by your health care provider. Ask your health care provider if the medicine prescribed to you: Requires you to avoid driving or using heavy machinery. Can cause constipation. You may need to take these actions to prevent or treat constipation: Drink enough fluid to keep your urine pale yellow. Take over-the-counter or prescription medicines. Eat foods that are high in fiber, such as beans, whole grains, and fresh fruits and vegetables. Limit foods that are high in fat and processed sugars, such as fried or sweet foods. Lifestyle Do not drink alcohol. Do not use any products that contain nicotine or tobacco, such as cigarettes, e-cigarettes, and chewing tobacco. If you need help quitting, ask your health care  provider. Get at least 8   hours of sleep every night. Find ways to manage stress, such as meditation, deep breathing, or yoga. General instructions   Keep a journal to find out what may trigger your migraine headaches. For example, write down: What you eat and drink. How much sleep you get. Any change to your diet or medicines. If you have a migraine headache: Avoid things that make your symptoms worse, such as bright lights. It may help to lie down in a dark, quiet room. Do not drive or use heavy machinery. Ask your health care provider what activities are safe for you while you are experiencing symptoms. Keep all follow-up visits as told by your health care provider. This is important. Contact a health care provider if: You develop symptoms that are different or more severe than your usual migraine headache symptoms. You have more than 15 headache days in one month. Get help right away if: Your migraine headache becomes severe. Your migraine headache lasts longer than 72 hours. You have a fever. You have a stiff neck. You have vision loss. Your muscles feel weak or like you cannot control them. You start to lose your balance often. You have trouble walking. You faint. You have a seizure. Summary A migraine headache is an intense, throbbing pain on one side or both sides of the head. Migraines may also cause other symptoms, such as nausea, vomiting, and sensitivity to light and noise. This condition may be treated with medicines and lifestyle changes. You may also need to avoid certain things that trigger a migraine headache. Keep a journal to find out what may trigger your migraine headaches. Contact your health care provider if you have more than 15 headache days in a month or you develop symptoms that are different or more severe than your usual migraine headache symptoms. This information is not intended to replace advice given to you by your health care provider. Make sure you  discuss any questions you have with your health care provider. Document Revised: 06/25/2018 Document Reviewed: 04/15/2018 Elsevier Patient Education  2022 Elsevier Inc.  

## 2021-02-20 NOTE — Progress Notes (Signed)
Double current dosage of Vit D

## 2021-02-22 LAB — ANTI-NUCLEAR AB-TITER (ANA TITER): ANA Titer 1: 1:320 {titer} — ABNORMAL HIGH

## 2021-02-22 LAB — COMPLETE METABOLIC PANEL WITH GFR
AG Ratio: 1.7 (calc) (ref 1.0–2.5)
ALT: 9 U/L (ref 6–29)
AST: 14 U/L (ref 10–35)
Albumin: 4.5 g/dL (ref 3.6–5.1)
Alkaline phosphatase (APISO): 86 U/L (ref 37–153)
BUN: 12 mg/dL (ref 7–25)
CO2: 28 mmol/L (ref 20–32)
Calcium: 10.2 mg/dL (ref 8.6–10.4)
Chloride: 102 mmol/L (ref 98–110)
Creat: 0.74 mg/dL (ref 0.50–1.03)
Globulin: 2.6 g/dL (calc) (ref 1.9–3.7)
Glucose, Bld: 88 mg/dL (ref 65–99)
Potassium: 4.5 mmol/L (ref 3.5–5.3)
Sodium: 140 mmol/L (ref 135–146)
Total Bilirubin: 0.3 mg/dL (ref 0.2–1.2)
Total Protein: 7.1 g/dL (ref 6.1–8.1)
eGFR: 94 mL/min/{1.73_m2} (ref >=60)

## 2021-02-22 LAB — LIPID PANEL
Cholesterol: 190 mg/dL (ref <200)
HDL: 51 mg/dL (ref >=50)
LDL Cholesterol (Calc): 120 mg/dL (calc) — ABNORMAL HIGH
Non-HDL Cholesterol (Calc): 139 mg/dL (calc) — ABNORMAL HIGH (ref <130)
Total CHOL/HDL Ratio: 3.7 (calc) (ref <5.0)
Triglycerides: 93 mg/dL (ref <150)

## 2021-02-22 LAB — CBC WITH DIFFERENTIAL/PLATELET
Absolute Monocytes: 418 cells/uL (ref 200–950)
Basophils Absolute: 50 cells/uL (ref 0–200)
Basophils Relative: 0.9 %
Eosinophils Absolute: 138 cells/uL (ref 15–500)
Eosinophils Relative: 2.5 %
HCT: 39.7 % (ref 35.0–45.0)
Hemoglobin: 13.2 g/dL (ref 11.7–15.5)
Lymphs Abs: 1859 cells/uL (ref 850–3900)
MCH: 29.9 pg (ref 27.0–33.0)
MCHC: 33.2 g/dL (ref 32.0–36.0)
MCV: 90 fL (ref 80.0–100.0)
MPV: 10.2 fL (ref 7.5–12.5)
Monocytes Relative: 7.6 %
Neutro Abs: 3036 cells/uL (ref 1500–7800)
Neutrophils Relative %: 55.2 %
Platelets: 352 10*3/uL (ref 140–400)
RBC: 4.41 10*6/uL (ref 3.80–5.10)
RDW: 12.2 % (ref 11.0–15.0)
Total Lymphocyte: 33.8 %
WBC: 5.5 10*3/uL (ref 3.8–10.8)

## 2021-02-22 LAB — ANA: Anti Nuclear Antibody (ANA): POSITIVE — AB

## 2021-02-22 LAB — VITAMIN D 25 HYDROXY (VIT D DEFICIENCY, FRACTURES): Vit D, 25-Hydroxy: 30 ng/mL (ref 30–100)

## 2021-02-22 LAB — MICROALBUMIN / CREATININE URINE RATIO
Creatinine, Urine: 28 mg/dL (ref 20–275)
Microalb Creat Ratio: 11 mcg/mg creat (ref <30)
Microalb, Ur: 0.3 mg/dL

## 2021-02-22 LAB — HEMOGLOBIN A1C
Hgb A1c MFr Bld: 5.3 % of total Hgb (ref <5.7)
Mean Plasma Glucose: 105 mg/dL
eAG (mmol/L): 5.8 mmol/L

## 2021-02-22 LAB — C-REACTIVE PROTEIN: CRP: 2.4 mg/L (ref <8.0)

## 2021-02-22 LAB — SEDIMENTATION RATE: Sed Rate: 14 mm/h (ref 0–30)

## 2021-02-22 LAB — URINALYSIS, ROUTINE W REFLEX MICROSCOPIC
Bilirubin Urine: NEGATIVE
Glucose, UA: NEGATIVE
Hgb urine dipstick: NEGATIVE
Ketones, ur: NEGATIVE
Leukocytes,Ua: NEGATIVE
Nitrite: NEGATIVE
Protein, ur: NEGATIVE
Specific Gravity, Urine: 1.006 (ref 1.001–1.035)
pH: 6.5 (ref 5.0–8.0)

## 2021-02-22 LAB — TSH: TSH: 0.93 mIU/L (ref 0.40–4.50)

## 2021-02-22 LAB — MAGNESIUM: Magnesium: 2.2 mg/dL (ref 1.5–2.5)

## 2021-02-23 ENCOUNTER — Other Ambulatory Visit: Payer: Self-pay | Admitting: Nurse Practitioner

## 2021-02-23 DIAGNOSIS — R768 Other specified abnormal immunological findings in serum: Secondary | ICD-10-CM

## 2021-03-06 ENCOUNTER — Encounter: Payer: Self-pay | Admitting: Internal Medicine

## 2021-04-01 ENCOUNTER — Encounter: Payer: Self-pay | Admitting: Cardiovascular Disease

## 2021-04-01 NOTE — Progress Notes (Signed)
Cardiology Office Note   Date:  04/02/2021   ID:  YAA DONNELLAN, DOB 04-14-1965, MRN [id number]  PCP:  Lucky Cowboy, MD  Cardiologist:   Kristeen Miss, MD   Chief Complaint  Patient presents with   Palpitations   Problem list 1. Palpitations 2. Hyperlipidemia 3. Cervical spine surgery - is on Lyrica for neuropathy pain .     [name] Smith  [facility] ) is a 55 y.o. female who presents for further evaluation of her palpitations.  ( Daughter of [name] )   Has noticed some palpitations on occasion.     Intermittent   It's intermittent and not constant. She's going through menopause. These palpitations have been occurring for the past couple of months. There is no associated syncope or presyncope. There is no chest pain or shortness breath.  Does not get regular exercise .  Just got a new puppy .   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   Feb. 13, 2018:  Sincerity  was seen several months ago for palpitations. She had an echocardiogram on 02/20/2015 which reveals normal left ventricular systolic function. She has grade 2 diastolic dysfunction.  Has been diagnosed with osteoperosis. She had stopped the hormone patch Her doctor wanted her to ask me about hormone replacement therapy  Aug 04, 2016:  Echo showed normal LV systolic function. Grade 2 diastolic dysfunction,  No MVP We Try Toprol-XL 25 mg a day but this did not agree with her. She is now cutting the Toprol in half and is tolerating 12.5 kg today. Is not exercising   Jul 31, 2017: [name] Smith is seen back today for follow up of her PVCs Still has fatigue ,   Has been found to have some thyroid nodules Has had some weight gain.   Is weaning off her pain meds.  Has lost 7 lbs in the past 2 weeks,   Has really cut her carbs  Is using a recumbant bike on occasion .   Sept. 3, 2020  [name] Smith is seen to follow up for her PVCs.   Wt today is 150 lbs  We started Toprol XL last year.  Trying to work out .    Has had some plantar fasciatis. She has a mid systolic click on exam today   Nov. 23, 2021: [name] Smith is seen today for follow up of her PVCs Has occasional palpitations Does not get much exercise,   Taking care of her father [name]) who is getting dementia.  Had covid back in May.  Has recovered .   Jan. 17, 2023 [name] Smith is seen for follow up of her PVCs and mild MVP  Wt is 162 lbs .  Her father , [name] passed away this past year  Has had more DOE Has gained weight .  Does not eat that much .  Is seening a rheumatologist  ANA is +    - following up with rheum  soon  Is not exercising much    Past Medical History:  Diagnosis Date   Allergy    Anemia    Arthritis    in hands   DDD (degenerative disc disease)    Headache    Hyperlipidemia    diet controlled   Hypertension    Neuropathy    PONV (postoperative nausea and vomiting)    Unspecified vitamin D deficiency     Past Surgical History:  Procedure Laterality Date   ABDOMINAL HYSTERECTOMY  ANTERIOR CERVICAL DECOMP/DISCECTOMY FUSION  03/29/2012   Procedure: ANTERIOR CERVICAL DECOMPRESSION/DISCECTOMY FUSION 1 LEVEL;  Surgeon: Temple PaciniHenry A Pool, MD;  Location: [facility];  Service: Neurosurgery;  Laterality: Bilateral;  Cervical four-five anterior cervical discectomy with fusion    CERVICAL DISC SURGERY     CESAREAN SECTION     DIAGNOSTIC LAPAROSCOPY     TONSILLECTOMY       Current Outpatient Medications  Medication Sig Dispense Refill   Cetirizine HCl (ZYRTEC PO) Take 1 tablet by mouth daily as needed. Allertec brand     Cholecalciferol (VITAMIN D) 2000 UNITS CAPS Take 12,000 Units by mouth daily.     Collagen-Boron-Hyaluronic Acid (MOVE FREE ULTRA JOINT HEALTH PO) Take 1 tablet by mouth daily.     Cyanocobalamin 2500 MCG TABS Take by mouth.     HYDROcodone-acetaminophen (NORCO/VICODIN) 5-325 MG tablet Take 1 tablet by mouth every 6 (six) hours as needed for moderate pain.     metoprolol succinate (TOPROL-XL) 25  MG 24 hr tablet Take 0.5 tablets (12.5 mg total) by mouth daily. 45 tablet 3   Rimegepant Sulfate (NURTEC) 75 MG TBDP Take 1 tablet by mouth as needed. (Patient not taking: Reported on 04/02/2021) 8 tablet 3   No current facility-administered medications for this visit.    Allergies:   Erythromycin, Penicillins, Prednisone, Tetracyclines & related, and Lexapro [escitalopram]   Social History:  The patient  reports that she quit smoking about 9 years ago. Her smoking use included cigarettes. She has never used smokeless tobacco. She reports that she does not drink alcohol and does not use drugs.   Family History:  The patient's family history includes Atrial fibrillation in her mother; Cancer in her father and maternal grandfather; Diabetes in her father and maternal aunt; Heart disease in her father; Hyperlipidemia in her father and mother; Hypertension in her father and mother.    ROS: Noted in current history, otherwise review of systems is negative.  Physical Exam: Blood pressure 118/86, pulse 86, height 5\' 5"  (1.651 m), weight 162 lb 3.2 oz (73.6 kg), SpO2 98 %.  GEN:  Well nourished, well developed in no acute distress HEENT: Normal NECK: No JVD; No carotid bruits LYMPHATICS: No lymphadenopathy CARDIAC: RRR , + mid systolic click,  no significant murmur  RESPIRATORY:  Clear to auscultation without rales, wheezing or rhonchi  ABDOMEN: Soft, non-tender, non-distended MUSCULOSKELETAL:  No edema; No deformity  SKIN: Warm and dry NEUROLOGIC:  Alert and oriented x 3    EKG:      Recent Labs: 02/19/2021: ALT 9; BUN 12; Creat 0.74; Hemoglobin 13.2; Magnesium 2.2; Platelets 352; Potassium 4.5; Sodium 140; TSH 0.93    Lipid Panel    Component Value Date/Time   CHOL 190 02/19/2021 1454   TRIG 93 02/19/2021 1454   HDL 51 02/19/2021 1454   CHOLHDL 3.7 02/19/2021 1454   VLDL 14 11/28/2015 1506   LDLCALC 120 (H) 02/19/2021 1454      Wt Readings from Last 3 Encounters:  04/02/21  162 lb 3.2 oz (73.6 kg)  02/19/21 162 lb (73.5 kg)  11/21/20 157 lb 12.8 oz (71.6 kg)      Other studies Reviewed: Additional studies/ records that were reviewed today include: . Review of the above records demonstrates:    ASSESSMENT AND PLAN:  1.  Palpitations:      2.  mitral valve prolapse. :   Has mid systolic click.   No significant MR murmur  I dont think her dyspnea is  related to worsening MR. We discussed getting an echo vs. Trying to get back into some shape.    She will hold off on the echo for now.   Try to work on [employer] ,  work on [employer] loss   I'll see in 1 year.         Current medicines are reviewed at length with the patient today.  The patient does not have concerns regarding medicines.  Labs/ tests ordered today include:   No orders of the defined types were placed in this encounter.  Disposition:   FU with me in 12 months  Kristeen Miss, MD  04/02/2021 3:04 PM    [facility] [address] [place], [place], [place]  [address] Phone: [phone]; Fax: [phone]

## 2021-04-02 ENCOUNTER — Other Ambulatory Visit: Payer: Self-pay

## 2021-04-02 ENCOUNTER — Ambulatory Visit: Payer: 59 | Admitting: Cardiovascular Disease

## 2021-04-02 ENCOUNTER — Encounter: Payer: Self-pay | Admitting: Cardiovascular Disease

## 2021-04-02 VITALS — BP 118/86 | HR 86 | Ht 65.0 in | Wt 162.2 lb

## 2021-04-02 DIAGNOSIS — I341 Nonrheumatic mitral (valve) prolapse: Secondary | ICD-10-CM

## 2021-04-02 NOTE — Patient Instructions (Signed)
Medication Instructions:  ° °Your physician recommends that you continue on your current medications as directed. Please refer to the Current Medication list given to you today. ° °*If you need a refill on your cardiac medications before your next appointment, please call your pharmacy* ° ° ° °Follow-Up: °At [facility], you and your health needs are our priority.  As part of our continuing mission to provide you with exceptional heart care, we have created designated Provider Care Teams.  These Care Teams include your primary Cardiologist (physician) and Advanced Practice Providers (APPs -  Physician Assistants and Nurse Practitioners) who all work together to provide you with the care you need, when you need it. ° °We recommend signing up for the patient portal called "MyChart".  Sign up information is provided on this After Visit Summary.  MyChart is used to connect with patients for Virtual Visits (Telemedicine).  Patients are able to view lab/test results, encounter notes, upcoming appointments, etc.  Non-urgent messages can be sent to your provider as well.   °To learn more about what you can do with MyChart, go to [email or web address].com.   ° °Your next appointment:   °1 year(s) ° °The format for your next appointment:   °In Person ° °Provider:   °Philip Nahser, MD { ° ° °

## 2021-04-09 LAB — HM DEXA SCAN

## 2021-04-09 LAB — RESULTS CONSOLE HPV: CHL HPV: NEGATIVE

## 2021-04-09 NOTE — Progress Notes (Signed)
Assessment and Plan:  [name] Smith was seen today for acute visit.  Diagnoses and all orders for this visit:  Age-related osteoporosis without current pathological fracture -     alendronate (FOSAMAX) 70 MG tablet; Take 1 tablet (70 mg total) by mouth every 7 (seven) days. Take with a full glass of water on an empty stomach.  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  - Will continue to monitor TSH closely as positive antithyroglobulin can mean Hashimotos and potential hypothyroidism - Monitor symptoms -     TSH        Further disposition pending results of labs. Discussed med's effects and SE's.   Over 30 minutes of exam, counseling, chart review, and critical decision making was performed.   Future Appointments  Date Time Provider Department Center  08/21/2021  2:30 PM Revonda Humphrey, NP [facility] None  02/19/2022  2:00 PM Revonda Humphrey, NP [facility] None    ------------------------------------------------------------------------------------------------------------------ +  HPI BP 114/74    Pulse 73    Temp (!) 97.3 F (36.3 C)    Wt 163 lb (73.9 kg)    SpO2 98%    BMI 27.12 kg/m  56 y.o.female presents for increased weight gain of 10 pounds in the past 6 months. Feels fatigued frequently.  Eating healthy, sleep disrupted because sleeps on pull out couch at her moms. Denies cold sensitivity, constipation, hair loss. She takes care of her mom who does seem to be going down hill health wise . 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  She also had DEXA yesterday and showed lumbar spine T score - 2.9. She does get calcium through [place] daily. She has not been exercising as much due to back pain and fatigue.   Past Medical History:  Diagnosis Date   Allergy    Anemia    Arthritis    in hands   DDD (degenerative disc disease)    Headache    Hyperlipidemia    diet controlled   Hypertension    Neuropathy    PONV (postoperative nausea and vomiting)    Unspecified vitamin D deficiency      Allergies  Allergen Reactions   Erythromycin Hives   Penicillins Hives   Prednisone Other (See Comments)    pain   Tetracyclines & Related Hives   Lexapro [Escitalopram] Anxiety    Jittery, headache    Current Outpatient Medications on File Prior to Visit  Medication Sig   Cetirizine HCl (ZYRTEC PO) Take 1 tablet by mouth daily as needed. Allertec brand   Cholecalciferol (VITAMIN D) 2000 UNITS CAPS Take 12,000 Units by mouth daily.   Collagen-Boron-Hyaluronic Acid (MOVE FREE ULTRA JOINT HEALTH PO) Take 1 tablet by mouth daily.   Cyanocobalamin 2500 MCG TABS Take by mouth.   HYDROcodone-acetaminophen (NORCO/VICODIN) 5-325 MG  tablet Take 1 tablet by mouth every 6 (six) hours as needed for moderate pain.   metoprolol succinate (TOPROL-XL) 25 MG 24 hr tablet Take 0.5 tablets (12.5 mg total) by mouth daily.   Rimegepant Sulfate (NURTEC) 75 MG TBDP Take 1 tablet by mouth as needed. (Patient not taking: Reported on 04/02/2021)   No current facility-administered medications on file prior to visit.    ROS: all negative except above.   Physical Exam:  BP 114/74    Pulse 73    Temp (!) 97.3 F (36.3 C)    Wt 163 lb (73.9 kg)    SpO2 98%    BMI 27.12  kg/m   General Appearance: Well nourished, in no apparent distress. Eyes: PERRLA, EOMs, conjunctiva no swelling or erythema Neck: Supple, thyroid normal.  Respiratory: Respiratory effort normal, BS equal bilaterally without rales, rhonchi, wheezing or stridor.  Cardio: RRR with no MRGs. Brisk peripheral pulses without edema.  Abdomen: Soft, + BS.  Non tender, no guarding, rebound, hernias, masses. Musculoskeletal: Full ROM, 5/5 strength, normal gait.  Skin: Warm, dry without rashes, lesions, ecchymosis.  Psych: Awake and oriented X 3, normal affect, Insight and Judgment appropriate.     Revonda Humphrey, NP 2:33 PM [facility]

## 2021-04-10 ENCOUNTER — Encounter: Payer: Self-pay | Admitting: Nurse Practitioner

## 2021-04-10 ENCOUNTER — Other Ambulatory Visit: Payer: Self-pay

## 2021-04-10 ENCOUNTER — Ambulatory Visit (INDEPENDENT_AMBULATORY_CARE_PROVIDER_SITE_OTHER): Payer: 59 | Admitting: Nurse Practitioner

## 2021-04-10 VITALS — BP 114/74 | HR 73 | Temp 97.3°F | Wt 163.0 lb

## 2021-04-10 DIAGNOSIS — R7989 Other specified abnormal findings of blood chemistry: Secondary | ICD-10-CM

## 2021-04-10 DIAGNOSIS — M810 Age-related osteoporosis without current pathological fracture: Secondary | ICD-10-CM

## 2021-04-10 LAB — HM PAP SMEAR

## 2021-04-10 LAB — TSH: TSH: 0.74 mIU/L (ref 0.40–4.50)

## 2021-04-10 MED ORDER — ALENDRONATE SODIUM 70 MG PO TABS: 70.0000 mg | ORAL_TABLET | ORAL | 11 refills | Status: DC

## 2021-05-02 ENCOUNTER — Encounter: Payer: Self-pay | Admitting: Internal Medicine

## 2021-07-11 ENCOUNTER — Telehealth: Payer: Self-pay

## 2021-07-11 NOTE — Telephone Encounter (Signed)
Tried to reach the pt to schedule a tele pre op appt, no answer no vm came on ?

## 2021-07-11 NOTE — Telephone Encounter (Signed)
? ? ?  Name: [name] Smith  ?DOB: March 17, 1966  ?MRN: [id number] ? ?Primary Cardiologist: Kristeen Miss, MD ? ? ?Preoperative team, please contact this patient and set up a phone call appointment for further preoperative risk assessment. Please obtain consent and complete medication review. Thank you for your help. ? ?I confirm that guidance regarding antiplatelet and oral anticoagulation therapy has been completed and, if necessary, noted below. ? ? ? ?Eula Listen, PA-C ?07/11/2021, 2:43 PM ?[facility] ?[address] ?[place], [place] [address] ? ? ?

## 2021-07-11 NOTE — Telephone Encounter (Signed)
? ?  Pre-operative Risk Assessment  ?  ?Patient Name: [name] Smith  ?DOB: 09-03-1965 ?MRN: [id number]  ? ?  ? ?Request for Surgical Clearance   ? ?Procedure:   C3-4, C5-6 ANTERIOR CERVICAL FUSION ? ?Date of Surgery:  Clearance 08/19/21                              ?   ?Surgeon:  DR. Kathaleen Maser. POOL ?Surgeon's Group or Practice Name:   [facility] ?Phone number:  [phone] ?Fax number:  [phone] ?  ?Type of Clearance Requested:   ?- Medical  ?  ?Type of Anesthesia:  General  ?  ?Additional requests/questions:   ? ?Signed, ?Michaelle Copas   ?07/11/2021, 12:57 PM   ?

## 2021-07-16 NOTE — Telephone Encounter (Addendum)
Tried x 2 to reach pt for tele pre op appt, no answer and no vm came on. I will send FYI to requesting office the pt will need appt. In hopes that they may s/w the pt to ask her to call her cardiologist office for telephone appt for pre op clearance.  ?

## 2021-08-21 ENCOUNTER — Ambulatory Visit: Payer: 59 | Admitting: Nurse Practitioner

## 2021-09-19 NOTE — Progress Notes (Signed)
FOLLOW UP 3 MONTH  Assessment and Plan:   Diagnoses and all orders for this visit:   Essential hypertension Continue current medications:Metoprolol SR 25mg , half tablet daily. Monitor blood pressure at home; call if consistently over 130/80 Continue DASH diet.   Reminder to go to the ER if any CP, SOB, nausea, dizziness, severe HA, changes vision/speech, left arm numbness and tingling and jaw pain. -     CBC with Differential/Platelet -     COMPLETE METABOLIC PANEL WITH GFR -     TSH   Hyperlipidemia, unspecified hyperlipidemia type Discussed dietary and exercise modifications Low fat diet -     Lipid panel  Vitamin D deficiency Continue supplementation to maintain goal of 70-100 Taking Vitamin D 6,000 IU daily   Abnormal glucose Discussed dietary and exercise modifications A1cs have been at goal, will check CMP  Recurrent major depressive disorder, in partial remission (HCC) Generalized anxiety disorder Currently well controlled without medication Discussed stress management techniques  Discussed, increase water,intake & good sleep hygiene  Discussed increasing exercise & vegetables in diet    Osteopenia, unspecified location Is on Fosamax, holding currently due to difficulty swallowing since neck surgery 5 weeks ago, will restart  DDD (degenerative disc disease), cervical Managing at this time Had cervical fusion 5 weeks ago  Migraine with aura and without status migrainosus, not intractable-      Currently controlled without medication    Discussed med's effects and SE's. Screening labs and tests as requested with regular follow-up as recommended.  Over 30 minutes of face to face interview, exam, counseling, chart review, and complex, high level critical decision making was performed this visit.  Future Appointments  Date Time Provider Department Center  02/19/2022  2:00 PM 14/08/2021, NP [facility] None    HPI 56 y.o. female  presents for three  month follow up on HTN, HLD,   04/2021 she turned her head and felt something pop in her neck and developed numbness and tingling down right arm.  C2 had slipped out of fusion . Dr. 05/2021 performed reconstruction of cervical fusion 5 weeks ago.  Pain in neck and numbness in arms has improved.  Will still get occasional pain when sitting more   Migraines are much less frequent and controlled without medication. She has tried sumatriptan and did not tolerate related to increased palpitations.  Mood is currently well controlled without medications.   Her son was married in January and they are having identical twins due in 01/2022  CT head 2012 MRI brain 2015 Sleep study 2017- negative   Her blood pressure has been controlled at home, today their BP is BP: 110/72  She does not workout due to chronic pain of back/neck.  She denies chest pain, shortness of breath, dizziness.  She goes to pain management.  BMI is Body mass index is 26.83 kg/m., she is working on diet and exercise. Wt Readings from Last 3 Encounters:  09/23/21 161 lb 3.2 oz (73.1 kg)  04/10/21 163 lb (73.9 kg)  04/02/21 162 lb 3.2 oz (73.6 kg)   She follows with Dr. 04/04/21 for palpitations follow yearly.  Upcoming   She is not on cholesterol medication and denies myalgias. Her cholesterol is at goal. She is limiting dairy, eggs, red meat and fried foods. The cholesterol last visit was:   Lab Results  Component Value Date   CHOL 190 02/19/2021   HDL 51 02/19/2021   LDLCALC 120 (H) 02/19/2021   TRIG 93 02/19/2021  CHOLHDL 3.7 02/19/2021    Last A1C in the office was:  Lab Results  Component Value Date   HGBA1C 5.3 02/19/2021   Patient is on Vitamin D supplement, she is on 6000 IU daily.   Lab Results  Component Value Date   VD25OH 30 02/19/2021     Following with Dr. Jennette Kettle, GYN, she is off the estrogen patch.  She follows with pain management and is on hydrocodone for that.   Current Medications:  Current  Outpatient Medications on File Prior to Visit  Medication Sig   Cetirizine HCl (ZYRTEC PO) Take 1 tablet by mouth daily as needed. Allertec brand   HYDROcodone-acetaminophen (NORCO/VICODIN) 5-325 MG tablet Take 1 tablet by mouth every 6 (six) hours as needed for moderate pain.   metoprolol succinate (TOPROL-XL) 25 MG 24 hr tablet Take 0.5 tablets (12.5 mg total) by mouth daily.   alendronate (FOSAMAX) 70 MG tablet Take 1 tablet (70 mg total) by mouth every 7 (seven) days. Take with a full glass of water on an empty stomach. (Patient not taking: Reported on 09/23/2021)   Cholecalciferol (VITAMIN D) 2000 UNITS CAPS Take 12,000 Units by mouth daily. (Patient not taking: Reported on 09/23/2021)   Collagen-Boron-Hyaluronic Acid (MOVE FREE ULTRA JOINT HEALTH PO) Take 1 tablet by mouth daily. (Patient not taking: Reported on 09/23/2021)   Cyanocobalamin 2500 MCG TABS Take by mouth. (Patient not taking: Reported on 09/23/2021)   No current facility-administered medications on file prior to visit.    Health Maintenance:   Immunization History  Administered Date(s) Administered   Influenza Inj Mdck Quad With Preservative 01/24/2020   Influenza,inj,Quad PF,6+ Mos 02/19/2021   Tdap 10/22/2011   Tetanus: 2013 Pneumovax: N/A Prevnar 13: due age 18 Flu vaccine: 01/24/20 Zostavax: N/A  Pap: 03/2021 get with Dr. Jennette Kettle  [facility]: 03/2021  gets with Dr. Jennette Kettle DEXA: 21/2023 Osteopenia Dr. Jennette Kettle   Colonoscopy: overdue Cologuard ordered, has not used. CXR 11/2015 MRI cervical 2015 MRI brain 2015 Sleep study 2017 Echo 02/2016  Eye: Dr. Emily Filbert, Due for 2021 Dental Exam complete 2021 : Patient Care Team: Lucky Cowboy, MD as PCP - General (Internal Medicine) Nahser, Deloris Ping, MD as PCP - Cardiology (Cardiology) Manning Charity, OD as Referring Physician (Optometry) Freddy Finner, MD as Consulting Physician (Obstetrics and Gynecology) Janalyn Harder, MD as Consulting Physician (Dermatology) Charna Elizabeth, MD  as Consulting Physician (Gastroenterology)  Medical History:  Past Medical History:  Diagnosis Date   Allergy    Anemia    Arthritis    in hands   DDD (degenerative disc disease)    Headache    Hyperlipidemia    diet controlled   Hypertension    Neuropathy    PONV (postoperative nausea and vomiting)    Unspecified vitamin D deficiency    Allergies Allergies  Allergen Reactions   Erythromycin Hives   Penicillins Hives   Prednisone Other (See Comments)    pain   Tetracyclines & Related Hives   Lexapro [Escitalopram] Anxiety    Jittery, headache    SURGICAL HISTORY She  has a past surgical history that includes Diagnostic laparoscopy; Abdominal hysterectomy; Tonsillectomy; Cervical disc surgery; Anterior cervical decomp/discectomy fusion (03/29/2012); and Cesarean section. FAMILY HISTORY Her family history includes Atrial fibrillation in her mother; Cancer in her father and maternal grandfather; Diabetes in her father and maternal aunt; Heart disease in her father; Hyperlipidemia in her father and mother; Hypertension in her father and mother. SOCIAL HISTORY She  reports that she quit smoking  about 10 years ago. Her smoking use included cigarettes. She has never used smokeless tobacco. She reports that she does not drink alcohol and does not use drugs.  Review of Systems  Constitutional:  Negative for chills and fever.  HENT:  Negative for congestion, hearing loss, sinus pain, sore throat and tinnitus.   Eyes:  Negative for blurred vision and double vision.  Respiratory:  Negative for cough, hemoptysis, sputum production, shortness of breath and wheezing.   Cardiovascular:  Negative for chest pain, palpitations and leg swelling.  Gastrointestinal:  Negative for abdominal pain, constipation, diarrhea, heartburn, nausea and vomiting.  Genitourinary:  Negative for dysuria and urgency.  Musculoskeletal:  Positive for neck pain. Negative for back pain, falls, joint pain and  myalgias.  Skin:  Negative for rash.  Neurological:  Negative for dizziness, tingling, tremors, weakness and headaches.  Endo/Heme/Allergies:  Does not bruise/bleed easily.  Psychiatric/Behavioral:  Negative for depression and suicidal ideas. The patient is not nervous/anxious and does not have insomnia.     Physical Exam: Estimated body mass index is 26.83 kg/m as calculated from the following:   Height as of 04/02/21: 5\' 5"  (1.651 m).   Weight as of this encounter: 161 lb 3.2 oz (73.1 kg). BP 110/72   Pulse 64   Temp 97.7 F (36.5 C)   Wt 161 lb 3.2 oz (73.1 kg)   SpO2 98%   BMI 26.83 kg/m  General Appearance: Well nourished, in no apparent distress. Eyes: PERRLA, EOMs, conjunctiva no swelling or erythema, normal fundi and vessels. Sinuses: No Frontal/maxillary tenderness ENT/Mouth: Ext aud canals clear, normal light reflex with TMs without erythema, bulging.  Good dentition. No erythema, swelling, or exudate on post pharynx. Tonsils not swollen or erythematous. Hearing normal.  Neck: Supple, thyroid with some nodules and possible very slight goiter. No bruits Respiratory: Respiratory effort normal, BS equal bilaterally without rales, rhonchi, wheezing or stridor. Cardio: RRR without murmurs, rubs or gallops. Brisk peripheral pulses without edema.  Abdomen: Soft, +BS, no guarding, hernias, masses, or organomegaly. .  Musculoskeletal: Full ROM all peripheral extremities,5/5 strength, and normal gait. Limited ROM of neck Skin: Warm, dry without rashes, lesions, ecchymosis.  Neuro: Cranial nerves intact, reflexes equal bilaterally. Normal muscle tone, no cerebellar symptoms. Sensation intact.  Psych: Awake and oriented X 3, normal affect, Insight and Judgment appropriate.     [facility]  09/23/2021

## 2021-09-23 ENCOUNTER — Ambulatory Visit: Payer: 59 | Admitting: Nurse Practitioner

## 2021-09-23 ENCOUNTER — Encounter: Payer: Self-pay | Admitting: Nurse Practitioner

## 2021-09-23 VITALS — BP 110/72 | HR 64 | Temp 97.7°F | Wt 161.2 lb

## 2021-09-23 DIAGNOSIS — E559 Vitamin D deficiency, unspecified: Secondary | ICD-10-CM | POA: Diagnosis not present

## 2021-09-23 DIAGNOSIS — E785 Hyperlipidemia, unspecified: Secondary | ICD-10-CM | POA: Diagnosis not present

## 2021-09-23 DIAGNOSIS — M8580 Other specified disorders of bone density and structure, unspecified site: Secondary | ICD-10-CM

## 2021-09-23 DIAGNOSIS — Z79899 Other long term (current) drug therapy: Secondary | ICD-10-CM

## 2021-09-23 DIAGNOSIS — F3341 Major depressive disorder, recurrent, in partial remission: Secondary | ICD-10-CM

## 2021-09-23 DIAGNOSIS — M5030 Other cervical disc degeneration, unspecified cervical region: Secondary | ICD-10-CM

## 2021-09-23 DIAGNOSIS — G43109 Migraine with aura, not intractable, without status migrainosus: Secondary | ICD-10-CM

## 2021-09-23 DIAGNOSIS — I10 Essential (primary) hypertension: Secondary | ICD-10-CM

## 2021-09-23 DIAGNOSIS — R7309 Other abnormal glucose: Secondary | ICD-10-CM | POA: Diagnosis not present

## 2021-09-24 LAB — CBC WITH DIFFERENTIAL/PLATELET
Absolute Monocytes: 371 cells/uL (ref 200–950)
Basophils Absolute: 58 cells/uL (ref 0–200)
Basophils Relative: 1.1 %
Eosinophils Absolute: 133 cells/uL (ref 15–500)
Eosinophils Relative: 2.5 %
HCT: 36.9 % (ref 35.0–45.0)
Hemoglobin: 12.2 g/dL (ref 11.7–15.5)
Lymphs Abs: 1500 cells/uL (ref 850–3900)
MCH: 29.4 pg (ref 27.0–33.0)
MCHC: 33.1 g/dL (ref 32.0–36.0)
MCV: 88.9 fL (ref 80.0–100.0)
MPV: 9.6 fL (ref 7.5–12.5)
Monocytes Relative: 7 %
Neutro Abs: 3238 cells/uL (ref 1500–7800)
Neutrophils Relative %: 61.1 %
Platelets: 321 10*3/uL (ref 140–400)
RBC: 4.15 10*6/uL (ref 3.80–5.10)
RDW: 12.5 % (ref 11.0–15.0)
Total Lymphocyte: 28.3 %
WBC: 5.3 10*3/uL (ref 3.8–10.8)

## 2021-09-24 LAB — COMPLETE METABOLIC PANEL WITH GFR
AG Ratio: 1.8 (calc) (ref 1.0–2.5)
ALT: 11 U/L (ref 6–29)
AST: 12 U/L (ref 10–35)
Albumin: 4.6 g/dL (ref 3.6–5.1)
Alkaline phosphatase (APISO): 64 U/L (ref 37–153)
BUN: 11 mg/dL (ref 7–25)
CO2: 28 mmol/L (ref 20–32)
Calcium: 9.7 mg/dL (ref 8.6–10.4)
Chloride: 106 mmol/L (ref 98–110)
Creat: 0.67 mg/dL (ref 0.50–1.03)
Globulin: 2.5 g/dL (calc) (ref 1.9–3.7)
Glucose, Bld: 96 mg/dL (ref 65–99)
Potassium: 4.2 mmol/L (ref 3.5–5.3)
Sodium: 143 mmol/L (ref 135–146)
Total Bilirubin: 0.3 mg/dL (ref 0.2–1.2)
Total Protein: 7.1 g/dL (ref 6.1–8.1)
eGFR: 101 mL/min/{1.73_m2} (ref >=60)

## 2021-09-24 LAB — LIPID PANEL
Cholesterol: 176 mg/dL (ref <200)
HDL: 48 mg/dL — ABNORMAL LOW (ref >=50)
LDL Cholesterol (Calc): 112 mg/dL (calc) — ABNORMAL HIGH
Non-HDL Cholesterol (Calc): 128 mg/dL (calc) (ref <130)
Total CHOL/HDL Ratio: 3.7 (calc) (ref <5.0)
Triglycerides: 72 mg/dL (ref <150)

## 2021-09-24 LAB — TSH: TSH: 0.99 mIU/L (ref 0.40–4.50)

## 2021-12-19 ENCOUNTER — Ambulatory Visit
Admission: EM | Admit: 2021-12-19 | Discharge: 2021-12-19 | Disposition: A | Discharge status: Home / Self Care | Payer: 59 | Attending: Urgent Care | Admitting: Urgent Care

## 2021-12-19 DIAGNOSIS — H8110 Benign paroxysmal vertigo, unspecified ear: Secondary | ICD-10-CM | POA: Diagnosis not present

## 2021-12-19 MED ORDER — MECLIZINE HCL 12.5 MG PO TABS: ORAL_TABLET | ORAL | 0 refills | Status: AC

## 2021-12-19 NOTE — Discharge Instructions (Addendum)
Please follow-up by requesting a referral to ENT from your primary care provider if your symptoms do not resolve.

## 2021-12-19 NOTE — ED Triage Notes (Signed)
Patient presents to UC for dizziness and eye movements after she cut the bathroom light off last night. Eye movements resolved. Nasal congestion and hoarseness x 2 days. Taking cold meds for nasal congestion.

## 2021-12-19 NOTE — ED Provider Notes (Signed)
[facility]    CSN: [id number] Arrival date & time: 12/19/21  1420      History   Chief Complaint Chief Complaint  Patient presents with   Dizziness   Knee Pain    HPI [name] Smith is a 56 y.o. female.    Dizziness Knee Pain   Patient presents to urgent care with complaint of dizziness and eye movements last night.  She states this occurred when she went into her bathroom and turn the light off.  Describes dizziness as "room spinning".  She endorses episodes of vertigo in the past but none lasting this long.  She denies respiratory infection.  Endorses significant stress in her life related to illness of her elderly mother.  Past Medical History:  Diagnosis Date   Allergy    Anemia    Arthritis    in hands   DDD (degenerative disc disease)    Headache    Hyperlipidemia    diet controlled   Hypertension    Neuropathy    PONV (postoperative nausea and vomiting)    Unspecified vitamin D deficiency     Patient Active Problem List   Diagnosis Date Noted   Thyroid nodule 11/21/2020   Migraine with aura and without status migrainosus, not intractable 05/07/2020   Recurrent major depressive disorder, in partial remission (HCC) 06/21/2019   Palpitations 08/13/2018   PVC's (premature ventricular contractions) 01/28/2016   Mitral valve prolapse 01/28/2016   Insomnia secondary to chronic pain 05/15/2015   Spinal stenosis in cervical region 05/15/2015   Osteopenia 11/27/2014   Essential hypertension 11/27/2014   Depression 03/14/2014   Generalized anxiety disorder 03/14/2014   Hyperlipidemia    Anemia    Vitamin D deficiency    DDD (degenerative disc disease), cervical     Past Surgical History:  Procedure Laterality Date   ABDOMINAL HYSTERECTOMY     ANTERIOR CERVICAL DECOMP/DISCECTOMY FUSION  03/29/2012   Procedure: ANTERIOR CERVICAL DECOMPRESSION/DISCECTOMY FUSION 1 LEVEL;  Surgeon: Temple Pacini, MD;  Location: [facility];  Service:  Neurosurgery;  Laterality: Bilateral;  Cervical four-five anterior cervical discectomy with fusion    CERVICAL DISC SURGERY     CESAREAN SECTION     DIAGNOSTIC LAPAROSCOPY     TONSILLECTOMY      OB History   No obstetric history on file.      Home Medications    Prior to Admission medications   Medication Sig Start Date End Date Taking? Authorizing Provider  alendronate (FOSAMAX) 70 MG tablet Take 1 tablet (70 mg total) by mouth every 7 (seven) days. Take with a full glass of water on an empty stomach. Patient not taking: Reported on 09/23/2021 04/10/21 04/10/22  Raynelle Dick, NP  Cetirizine HCl (ZYRTEC PO) Take 1 tablet by mouth daily as needed. Allertec brand    [provider]  Cholecalciferol (VITAMIN D) 2000 UNITS CAPS Take 12,000 Units by mouth daily. Patient not taking: Reported on 09/23/2021    [provider]  Collagen-Boron-Hyaluronic Acid (MOVE FREE ULTRA JOINT HEALTH PO) Take 1 tablet by mouth daily. Patient not taking: Reported on 09/23/2021    [provider]  Cyanocobalamin 2500 MCG TABS Take by mouth. Patient not taking: Reported on 09/23/2021    [provider]  HYDROcodone-acetaminophen (NORCO/VICODIN) 5-325 MG tablet Take 1 tablet by mouth every 6 (six) hours as needed for moderate pain.    [provider]  metoprolol succinate (TOPROL-XL) 25 MG 24 hr tablet Take 0.5 tablets (  12.5 mg total) by mouth daily. 01/16/21 01/11/22  Nahser, Deloris Ping, MD    Family History Family History  Problem Relation Age of Onset   Hypertension Mother    Hyperlipidemia Mother    Atrial fibrillation Mother    Heart disease Father    Hyperlipidemia Father    Cancer Father        prostate   Hypertension Father    Diabetes Father    Diabetes Maternal Aunt    Cancer Maternal Grandfather        colon    Social History Social History   Tobacco Use   Smoking status: Former    Years: 3.00    Types: Cigarettes    Quit date: 08/17/2011     Years since quitting: 10.3   Smokeless tobacco: Never  Vaping Use   Vaping Use: Never used  Substance Use Topics   Alcohol use: No   Drug use: No     Allergies   Erythromycin, Misc. sulfonamide containing compounds, Penicillins, Prednisone, Tetracyclines & related, and Lexapro [escitalopram]   Review of Systems Review of Systems  Neurological:  Positive for dizziness.     Physical Exam Triage Vital Signs ED Triage Vitals  Enc Vitals Group     BP 12/19/21 1443 116/80     Pulse Rate 12/19/21 1443 72     Resp 12/19/21 1443 16     Temp 12/19/21 1443 98.6 F (37 C)     Temp Source 12/19/21 1443 Oral     SpO2 12/19/21 1443 98 %     Weight --      Height --      Head Circumference --      Peak Flow --      Pain Score 12/19/21 1431 0     Pain Loc --      Pain Edu? --      Excl. in GC? --    No data found.  Updated Vital Signs BP 116/80 (BP Location: Left Arm)   Pulse 72   Temp 98.6 F (37 C) (Oral)   Resp 16   SpO2 98%   Visual Acuity Right Eye Distance:   Left Eye Distance:   Bilateral Distance:    Right Eye Near:   Left Eye Near:    Bilateral Near:     Physical Exam Vitals reviewed.  Constitutional:      Appearance: Normal appearance.  Skin:    General: Skin is warm and dry.  Neurological:     General: No focal deficit present.     Mental Status: She is alert and oriented to person, place, and time.  Psychiatric:        Mood and Affect: Mood normal.        Behavior: Behavior normal.      UC Treatments / Results  Labs (all labs ordered are listed, but only abnormal results are displayed) Labs Reviewed - No data to display  EKG   Radiology No results found.  Procedures Procedures (including critical care time)  Medications Ordered in UC Medications - No data to display  Initial Impression / Assessment and Plan / UC Course  I have reviewed the triage vital signs and the nursing notes.  Pertinent labs & imaging results that were  available during my care of the patient were reviewed by me and considered in my medical decision making (see chart for details).   Classic presentation of BPPV.  Will provide resources on performing Epley maneuver and  provide meclizine for symptom control.  Her to follow-up with ENT if symptoms do not resolve.   Final Clinical Impressions(s) / UC Diagnoses   Final diagnoses:  None   Discharge Instructions   None    ED Prescriptions   None    PDMP not reviewed this encounter.   Rose Phi, FNP 12/19/21 1520

## 2022-01-01 NOTE — Progress Notes (Unsigned)
FOLLOW UP 3 MONTH  Assessment and Plan:   Diagnoses and all orders for this visit:   Essential hypertension Continue current medications:Metoprolol SR 25mg , half tablet daily. Monitor blood pressure at home; call if consistently over 130/80 Continue DASH diet.   Reminder to go to the ER if any CP, SOB, nausea, dizziness, severe HA, changes vision/speech, left arm numbness and tingling and jaw pain. -     CBC with Differential/Platelet -     COMPLETE METABOLIC PANEL WITH GFR -     TSH   Hyperlipidemia, unspecified hyperlipidemia type Discussed dietary and exercise modifications Low fat diet -     Lipid panel  Vitamin D deficiency Continue supplementation to maintain goal of 70-100 Taking Vitamin D 6,000 IU daily   Abnormal glucose Discussed dietary and exercise modifications A1cs have been at goal, will check CMP  Recurrent major depressive disorder, in partial remission (HCC) Generalized anxiety disorder Currently well controlled without medication Discussed stress management techniques  Discussed, increase water,intake & good sleep hygiene  Discussed increasing exercise & vegetables in diet   Migraine with aura and without status migrainosus, not intractable-      Currently controlled without medication    Discussed med's effects and SE's. Screening labs and tests as requested with regular follow-up as recommended.  Over 30 minutes of face to face interview, exam, counseling, chart review, and complex, high level critical decision making was performed this visit.  Future Appointments  Date Time Provider [place]  01/02/2022  4:00 PM Alycia Rossetti, NP [facility] None  02/19/2022  2:00 PM Alycia Rossetti, NP [facility] None    HPI 56 y.o. female  presents for three month follow up on HTN, HLD,   04/2021 she turned her head and felt something pop in her neck and developed numbness and tingling down right arm.  C2 had slipped out of fusion . Dr.  Trenton Gammon performed reconstruction of cervical fusion 5 weeks ago.  Pain in neck and numbness in arms has improved.  Will still get occasional pain when sitting more   Migraines are much less frequent and controlled without medication. She has tried sumatriptan and did not tolerate related to increased palpitations.  Mood is currently well controlled without medications.   Her son was married in January and they are having identical twins due in 01/2022  CT head 2012 MRI brain 2015 Sleep study 2017- negative   Her blood pressure has been controlled at home, today their BP is    She does not workout due to chronic pain of back/neck.  She denies chest pain, shortness of breath, dizziness.  She goes to pain management.  BMI is There is no height or weight on file to calculate BMI., she is working on diet and exercise. Wt Readings from Last 3 Encounters:  09/23/21 161 lb 3.2 oz (73.1 kg)  04/10/21 163 lb (73.9 kg)  04/02/21 162 lb 3.2 oz (73.6 kg)   She follows with Dr. Cathie Olden for palpitations follow yearly.  Upcoming   She is not on cholesterol medication and denies myalgias. Her cholesterol is at goal. She is limiting dairy, eggs, red meat and fried foods. The cholesterol last visit was:   Lab Results  Component Value Date   CHOL 176 09/23/2021   HDL 48 (L) 09/23/2021   LDLCALC 112 (H) 09/23/2021   TRIG 72 09/23/2021   CHOLHDL 3.7 09/23/2021    Last A1C in the office was:  Lab Results  Component Value Date  HGBA1C 5.3 02/19/2021   Patient is on Vitamin D supplement, she is on 6000 IU daily.   Lab Results  Component Value Date   VD25OH 30 02/19/2021     Following with Dr. Jennette Kettle, GYN, she is off the estrogen patch.  She follows with pain management and is on hydrocodone for that.   Current Medications:  Current Outpatient Medications on File Prior to Visit  Medication Sig   alendronate (FOSAMAX) 70 MG tablet Take 1 tablet (70 mg total) by mouth every 7 (seven) days. Take with  a full glass of water on an empty stomach. (Patient not taking: Reported on 09/23/2021)   Cetirizine HCl (ZYRTEC PO) Take 1 tablet by mouth daily as needed. Allertec brand   Cholecalciferol (VITAMIN D) 2000 UNITS CAPS Take 12,000 Units by mouth daily. (Patient not taking: Reported on 09/23/2021)   Collagen-Boron-Hyaluronic Acid (MOVE FREE ULTRA JOINT HEALTH PO) Take 1 tablet by mouth daily. (Patient not taking: Reported on 09/23/2021)   Cyanocobalamin 2500 MCG TABS Take by mouth. (Patient not taking: Reported on 09/23/2021)   HYDROcodone-acetaminophen (NORCO/VICODIN) 5-325 MG tablet Take 1 tablet by mouth every 6 (six) hours as needed for moderate pain.   meclizine (ANTIVERT) 12.5 MG tablet Take 1-2 tablets by mouth 3 (three) times daily as needed for dizziness   metoprolol succinate (TOPROL-XL) 25 MG 24 hr tablet Take 0.5 tablets (12.5 mg total) by mouth daily.   No current facility-administered medications on file prior to visit.    Health Maintenance:   Immunization History  Administered Date(s) Administered   Influenza Inj Mdck Quad With Preservative 01/24/2020   Influenza,inj,Quad PF,6+ Mos 02/19/2021   Tdap 10/22/2011   Tetanus: 2013 Pneumovax: N/A Prevnar 13: due age 6 Flu vaccine: 01/24/20 Zostavax: N/A  Pap: 03/2021 get with Dr. Jennette Kettle  [facility]: 03/2021  gets with Dr. Jennette Kettle DEXA: 21/2023 Osteopenia Dr. Jennette Kettle   Colonoscopy: overdue Cologuard ordered, has not used. CXR 11/2015 MRI cervical 2015 MRI brain 2015 Sleep study 2017 Echo 02/2016  Eye: Dr. Emily Filbert, Due for 2021 Dental Exam complete 2021 : Patient Care Team: Lucky Cowboy, MD as PCP - General (Internal Medicine) Nahser, Deloris Ping, MD as PCP - Cardiology (Cardiology) Manning Charity, OD as Referring Physician (Optometry) Freddy Finner, MD (Inactive) as Consulting Physician (Obstetrics and Gynecology) Janalyn Harder, MD (Inactive) as Consulting Physician (Dermatology) Charna Elizabeth, MD as Consulting Physician  (Gastroenterology)  Medical History:  Past Medical History:  Diagnosis Date   Allergy    Anemia    Arthritis    in hands   DDD (degenerative disc disease)    Headache    Hyperlipidemia    diet controlled   Hypertension    Neuropathy    PONV (postoperative nausea and vomiting)    Unspecified vitamin D deficiency    Allergies Allergies  Allergen Reactions   Erythromycin Hives   Misc. Sulfonamide Containing Compounds    Penicillins Hives   Prednisone Other (See Comments)    pain   Tetracyclines & Related Hives   Lexapro [Escitalopram] Anxiety    Jittery, headache    SURGICAL HISTORY She  has a past surgical history that includes Diagnostic laparoscopy; Abdominal hysterectomy; Tonsillectomy; Cervical disc surgery; Anterior cervical decomp/discectomy fusion (03/29/2012); and Cesarean section. FAMILY HISTORY Her family history includes Atrial fibrillation in her mother; Cancer in her father and maternal grandfather; Diabetes in her father and maternal aunt; Heart disease in her father; Hyperlipidemia in her father and mother; Hypertension in her father and mother. SOCIAL  HISTORY She  reports that she quit smoking about 10 years ago. Her smoking use included cigarettes. She has never used smokeless tobacco. She reports that she does not drink alcohol and does not use drugs.  Review of Systems  Constitutional:  Negative for chills and fever.  HENT:  Negative for congestion, hearing loss, sinus pain, sore throat and tinnitus.   Eyes:  Negative for blurred vision and double vision.  Respiratory:  Negative for cough, hemoptysis, sputum production, shortness of breath and wheezing.   Cardiovascular:  Negative for chest pain, palpitations and leg swelling.  Gastrointestinal:  Negative for abdominal pain, constipation, diarrhea, heartburn, nausea and vomiting.  Genitourinary:  Negative for dysuria and urgency.  Musculoskeletal:  Positive for neck pain. Negative for back pain, falls,  joint pain and myalgias.  Skin:  Negative for rash.  Neurological:  Negative for dizziness, tingling, tremors, weakness and headaches.  Endo/Heme/Allergies:  Does not bruise/bleed easily.  Psychiatric/Behavioral:  Negative for depression and suicidal ideas. The patient is not nervous/anxious and does not have insomnia.     Physical Exam: Estimated body mass index is 26.83 kg/m as calculated from the following:   Height as of 04/02/21: 5\' 5"  (1.651 m).   Weight as of 09/23/21: 161 lb 3.2 oz (73.1 kg). There were no vitals taken for this visit. General Appearance: Well nourished, in no apparent distress. Eyes: PERRLA, EOMs, conjunctiva no swelling or erythema, normal fundi and vessels. Sinuses: No Frontal/maxillary tenderness ENT/Mouth: Ext aud canals clear, normal light reflex with TMs without erythema, bulging.  Good dentition. No erythema, swelling, or exudate on post pharynx. Tonsils not swollen or erythematous. Hearing normal.  Neck: Supple, thyroid with some nodules and possible very slight goiter. No bruits Respiratory: Respiratory effort normal, BS equal bilaterally without rales, rhonchi, wheezing or stridor. Cardio: RRR without murmurs, rubs or gallops. Brisk peripheral pulses without edema.  Abdomen: Soft, +BS, no guarding, hernias, masses, or organomegaly. .  Musculoskeletal: Full ROM all peripheral extremities,5/5 strength, and normal gait. Limited ROM of neck Skin: Warm, dry without rashes, lesions, ecchymosis.  Neuro: Cranial nerves intact, reflexes equal bilaterally. Normal muscle tone, no cerebellar symptoms. Sensation intact.  Psych: Awake and oriented X 3, normal affect, Insight and Judgment appropriate.     11/24/21 [facility]  01/01/2022

## 2022-01-02 ENCOUNTER — Encounter: Payer: Self-pay | Admitting: Nurse Practitioner

## 2022-01-02 ENCOUNTER — Ambulatory Visit: Payer: 59 | Admitting: Nurse Practitioner

## 2022-01-02 VITALS — BP 128/88 | HR 72 | Temp 97.5°F | Ht 65.0 in | Wt 162.2 lb

## 2022-01-02 DIAGNOSIS — R7309 Other abnormal glucose: Secondary | ICD-10-CM | POA: Diagnosis not present

## 2022-01-02 DIAGNOSIS — E785 Hyperlipidemia, unspecified: Secondary | ICD-10-CM

## 2022-01-02 DIAGNOSIS — Z79899 Other long term (current) drug therapy: Secondary | ICD-10-CM

## 2022-01-02 DIAGNOSIS — I10 Essential (primary) hypertension: Secondary | ICD-10-CM

## 2022-01-02 DIAGNOSIS — F3341 Major depressive disorder, recurrent, in partial remission: Secondary | ICD-10-CM

## 2022-01-02 DIAGNOSIS — F411 Generalized anxiety disorder: Secondary | ICD-10-CM

## 2022-01-02 DIAGNOSIS — G43109 Migraine with aura, not intractable, without status migrainosus: Secondary | ICD-10-CM

## 2022-01-02 DIAGNOSIS — E559 Vitamin D deficiency, unspecified: Secondary | ICD-10-CM

## 2022-01-02 MED ORDER — BUSPIRONE HCL 5 MG PO TABS: 5.0000 mg | ORAL_TABLET | Freq: Three times a day (TID) | ORAL | 3 refills | Status: AC

## 2022-01-02 MED ORDER — VENLAFAXINE HCL ER 37.5 MG PO CP24: 37.5000 mg | ORAL_CAPSULE | Freq: Every day | ORAL | 2 refills | Status: DC

## 2022-01-24 ENCOUNTER — Telehealth: Payer: Self-pay | Admitting: Nurse Practitioner

## 2022-01-24 NOTE — Telephone Encounter (Signed)
Patient states that since starting the Effexor she has had diarrhea and bad headaches. Would like to switch to a different medication if possible. [facility]: [address]

## 2022-01-24 NOTE — Telephone Encounter (Signed)
Please have pt schedule an appointment to discuss medication next week

## 2022-02-09 ENCOUNTER — Other Ambulatory Visit: Payer: Self-pay | Admitting: Cardiovascular Disease

## 2022-02-18 NOTE — Progress Notes (Deleted)
COMPLETE PHYSICAL  Assessment and Plan:   [name] Smith was seen today for annual exam.  Diagnoses and all orders for this visit:  Encounter for general adult medical examination with abnormal findings Yearly  Essential hypertension Continue current medications:Metoprolol SR 25mg , half tablet daily. Monitor blood pressure at home; call if consistently over 130/80 Continue DASH diet.   Reminder to go to the ER if any CP, SOB, nausea, dizziness, severe HA, changes vision/speech, left arm numbness and tingling and jaw pain. -     CBC with Differential/Platelet -     COMPLETE METABOLIC PANEL WITH GFR -     TSH -     Magnesium -     EKG 12-Lead  Hyperlipidemia, unspecified hyperlipidemia type Discussed dietary and exercise modifications Low fat diet -     Lipid panel  Vitamin D deficiency Continue supplementation to maintain goal of 70-100 Taking Vitamin D 6,000 IU daily -     VITAMIN D 25 Hydroxy (Vit-D Deficiency, Fractures)  Abnormal glucose Discussed dietary and exercise modifications -     Hemoglobin A1c  Recurrent major depressive disorder, in partial remission (HCC) Generalized anxiety disorder Discussed stress management techniques  Discussed, increase water,intake & good sleep hygiene  Discussed increasing exercise & vegetables in diet  Needs flu shot -     FLU VACCINE MDCK QUAD W/Preservative  Screening for ischemic heart disease EKG  Screening for hematuria or proteinuria Routine UA with reflex microscopic Microalbumin/creatinine urine ratio  Diarrhea, unspecified type Discussed metamucil or probiotic Continue to monitor   Osteopenia, unspecified location Has DEXA scheduled for 03/2021  DDD (degenerative disc disease), cervical Managing at this time Has seen Dr 04/2021 in past.  Migraine with aura and without status migrainosus, not intractable-      - can not tolerate imitrex due to palpitations/heart issues, given sample and RX for nurtec Was not able to  get insurance to cover Nurtec previously but has failed an SSNRI(effexor) and triptan caused increased palpitations and can not take     Discussed med's effects and SE's. Screening labs and tests as requested with regular follow-up as recommended. Over 45 minutes of face to face interview, exam, counseling, chart review, and complex, high level critical decision making was performed this visit.   HPI 56 y.o. female  presents for a complete physical.  She has tried sumatriptan and did not tolerate related to increased palpitations. Was also on Venlafaxine with no results. Pt is getting 2-4 migraines a month currently. Sensitivity to light/sound/ nausea and diarrhea associated with headaches.   CT head 2012 MRI brain 2015 Sleep study 2017- negative   Her blood pressure has been controlled at home, today their BP is    She does not workout due to chronic pain of back/neck.  She denies chest pain, shortness of breath, dizziness.  BMI is There is no height or weight on file to calculate BMI., she is not working on diet and exercise currently, has increased back pain-lumbar.  Previously used steroid injections but Ortho now states its arthritis not a degeneration issue.  She is also having knees, ankles hands and back pain.   Wt Readings from Last 3 Encounters:  01/02/22 162 lb 3.2 oz (73.6 kg)  09/23/21 161 lb 3.2 oz (73.1 kg)  04/10/21 163 lb (73.9 kg)   She follows with Dr. 04/12/21 for palpitations follow yearly.  Upcoming  1/-2023 She is not on cholesterol medication and denies myalgias. Her cholesterol is at goal. The cholesterol last visit  was:   Lab Results  Component Value Date   CHOL 176 09/23/2021   HDL 48 (L) 09/23/2021   LDLCALC 112 (H) 09/23/2021   TRIG 72 09/23/2021   CHOLHDL 3.7 09/23/2021    Last A1C in the office was:  Lab Results  Component Value Date   HGBA1C 5.3 02/19/2021   Patient is on Vitamin D supplement, she is on 6000 IU daily.   Lab Results  Component  Value Date   VD25OH 30 02/19/2021     Following with Dr. Jennette Kettle, GYN, she is off the estrogen patch.  She follows with pain management and is on hydrocodone for that.  She is on xanax AS needed, very low dose 0.25mg , last refill was 08/09/2017 for 58 and she states she still has some.   Current Medications:  Current Outpatient Medications on File Prior to Visit  Medication Sig   alendronate (FOSAMAX) 70 MG tablet Take 1 tablet (70 mg total) by mouth every 7 (seven) days. Take with a full glass of water on an empty stomach.   busPIRone (BUSPAR) 5 MG tablet Take 1 tablet (5 mg total) by mouth 3 (three) times daily.   Cetirizine HCl (ZYRTEC PO) Take 1 tablet by mouth daily as needed. Allertec brand   Cholecalciferol (VITAMIN D) 2000 UNITS CAPS Take 12,000 Units by mouth daily. (Patient not taking: Reported on 09/23/2021)   Collagen-Boron-Hyaluronic Acid (MOVE FREE ULTRA JOINT HEALTH PO) Take 1 tablet by mouth daily. (Patient not taking: Reported on 09/23/2021)   Cyanocobalamin 2500 MCG TABS Take by mouth. (Patient not taking: Reported on 09/23/2021)   HYDROcodone-acetaminophen (NORCO/VICODIN) 5-325 MG tablet Take 1 tablet by mouth every 6 (six) hours as needed for moderate pain.   meclizine (ANTIVERT) 12.5 MG tablet Take 1-2 tablets by mouth 3 (three) times daily as needed for dizziness   metoprolol succinate (TOPROL-XL) 25 MG 24 hr tablet TAKE 1/2 TABLET BY MOUTH EVERY DAY   venlafaxine XR (EFFEXOR XR) 37.5 MG 24 hr capsule Take 1 capsule (37.5 mg total) by mouth daily.   No current facility-administered medications on file prior to visit.    Health Maintenance:   Immunization History  Administered Date(s) Administered   Influenza Inj Mdck Quad With Preservative 01/24/2020   Influenza,inj,Quad PF,6+ Mos 02/19/2021   Tdap 10/22/2011   Tetanus: 2013 Pneumovax: N/A Prevnar 13: due age 74 Flu vaccine: 01/24/20 Zostavax: N/A  Pap: 2017 get with Dr. Jennette Kettle scheduled 03/2021 3DMGM: 10/2017  gets  with Dr. Jennette Kettle, Scheduled 03/2021 DEXA: 2017 Osteopenia Dr. Jennette Kettle  Scheduled 03/2021 Colonoscopy: Cologuard 11/21/20 Negative CXR 11/2015 MRI cervical 2015 MRI brain 2015 Sleep study 2017 Echo 02/2016  Eye: Dr. Emily Filbert, Due for 2022 Dental Exam complete 2022 : Patient Care Team: Lucky Cowboy, MD as PCP - General (Internal Medicine) Nahser, Deloris Ping, MD as PCP - Cardiology (Cardiology) Manning Charity, OD as Referring Physician (Optometry) Freddy Finner, MD (Inactive) as Consulting Physician (Obstetrics and Gynecology) Janalyn Harder, MD (Inactive) as Consulting Physician (Dermatology) Charna Elizabeth, MD as Consulting Physician (Gastroenterology)  Medical History:  Past Medical History:  Diagnosis Date   Allergy    Anemia    Arthritis    in hands   DDD (degenerative disc disease)    Headache    Hyperlipidemia    diet controlled   Hypertension    Neuropathy    PONV (postoperative nausea and vomiting)    Unspecified vitamin D deficiency    Allergies Allergies  Allergen Reactions   Erythromycin Hives  Misc. Sulfonamide Containing Compounds    Penicillins Hives   Prednisone Other (See Comments)    pain   Tetracyclines & Related Hives   Lexapro [Escitalopram] Anxiety    Jittery, headache    SURGICAL HISTORY She  has a past surgical history that includes Diagnostic laparoscopy; Abdominal hysterectomy; Tonsillectomy; Cervical disc surgery; Anterior cervical decomp/discectomy fusion (03/29/2012); and Cesarean section. FAMILY HISTORY Her family history includes Atrial fibrillation in her mother; Cancer in her father and maternal grandfather; Diabetes in her father and maternal aunt; Heart disease in her father; Hyperlipidemia in her father and mother; Hypertension in her father and mother. SOCIAL HISTORY She  reports that she quit smoking about 10 years ago. Her smoking use included cigarettes. She has never used smokeless tobacco. She reports that she does not drink alcohol and  does not use drugs.  Review of Systems  Constitutional:  Positive for malaise/fatigue. Negative for chills and fever.  HENT:  Negative for congestion, hearing loss, sinus pain, sore throat and tinnitus.   Eyes:  Positive for blurred vision (with migraine) and photophobia (with migraine). Negative for double vision.  Respiratory:  Negative for cough, hemoptysis, sputum production, shortness of breath and wheezing.   Cardiovascular:  Negative for chest pain, palpitations and leg swelling.  Gastrointestinal:  Negative for abdominal pain, constipation, diarrhea, heartburn, nausea and vomiting.  Genitourinary:  Negative for dysuria and urgency.  Musculoskeletal:  Positive for back pain and joint pain. Negative for falls, myalgias and neck pain.  Skin:  Negative for rash.  Neurological:  Positive for headaches. Negative for dizziness, tingling, tremors and weakness.  Endo/Heme/Allergies:  Does not bruise/bleed easily.  Psychiatric/Behavioral:  Negative for depression and suicidal ideas. The patient is not nervous/anxious and does not have insomnia.     Physical Exam: Estimated body mass index is 26.99 kg/m as calculated from the following:   Height as of 01/02/22: 5\' 5"  (1.651 m).   Weight as of 01/02/22: 162 lb 3.2 oz (73.6 kg). There were no vitals taken for this visit. General Appearance: Well nourished, in no apparent distress. Eyes: PERRLA, EOMs, conjunctiva no swelling or erythema, normal fundi and vessels. Sinuses: No Frontal/maxillary tenderness ENT/Mouth: Ext aud canals clear, normal light reflex with TMs without erythema, bulging.  Good dentition. No erythema, swelling, or exudate on post pharynx. Tonsils not swollen or erythematous. Hearing normal.  Neck: Supple, thyroid with some nodules and possible very slight goiter. No bruits Respiratory: Respiratory effort normal, BS equal bilaterally without rales, rhonchi, wheezing or stridor. Cardio: RRR without murmurs, rubs or gallops.  Brisk peripheral pulses without edema.  Chest: symmetric, with normal excursions and percussion. Breasts: defer Abdomen: Soft, +BS, no guarding, hernias, masses, or organomegaly. .  Lymphatics: Non tender without lymphadenopathy.  Genitourinary: defer Musculoskeletal: Full ROM all peripheral extremities,5/5 strength, and normal gait. Skin: Warm, dry without rashes, lesions, ecchymosis.  Neuro: Cranial nerves intact, reflexes equal bilaterally. Normal muscle tone, no cerebellar symptoms. Sensation intact.  Psych: Awake and oriented X 3, normal affect, Insight and Judgment appropriate.   EKG: Sinus bradycardia, IRBBB on Metoprolol, No ST changes  01/04/22 [facility]  02/18/2022

## 2022-02-19 ENCOUNTER — Encounter: Payer: 59 | Admitting: Nurse Practitioner

## 2022-02-19 DIAGNOSIS — Z79899 Other long term (current) drug therapy: Secondary | ICD-10-CM

## 2022-02-19 DIAGNOSIS — Z1329 Encounter for screening for other suspected endocrine disorder: Secondary | ICD-10-CM

## 2022-02-19 DIAGNOSIS — G43109 Migraine with aura, not intractable, without status migrainosus: Secondary | ICD-10-CM

## 2022-02-19 DIAGNOSIS — R7309 Other abnormal glucose: Secondary | ICD-10-CM

## 2022-02-19 DIAGNOSIS — Z1389 Encounter for screening for other disorder: Secondary | ICD-10-CM

## 2022-02-19 DIAGNOSIS — E785 Hyperlipidemia, unspecified: Secondary | ICD-10-CM

## 2022-02-19 DIAGNOSIS — I10 Essential (primary) hypertension: Secondary | ICD-10-CM

## 2022-02-19 DIAGNOSIS — Z0001 Encounter for general adult medical examination with abnormal findings: Secondary | ICD-10-CM

## 2022-02-19 DIAGNOSIS — F411 Generalized anxiety disorder: Secondary | ICD-10-CM

## 2022-02-19 DIAGNOSIS — F3341 Major depressive disorder, recurrent, in partial remission: Secondary | ICD-10-CM

## 2022-02-19 DIAGNOSIS — M5030 Other cervical disc degeneration, unspecified cervical region: Secondary | ICD-10-CM

## 2022-02-19 DIAGNOSIS — E559 Vitamin D deficiency, unspecified: Secondary | ICD-10-CM

## 2022-02-19 DIAGNOSIS — M8580 Other specified disorders of bone density and structure, unspecified site: Secondary | ICD-10-CM

## 2022-02-19 DIAGNOSIS — Z136 Encounter for screening for cardiovascular disorders: Secondary | ICD-10-CM

## 2022-03-03 ENCOUNTER — Encounter: Payer: Self-pay | Admitting: Internal Medicine

## 2022-03-04 NOTE — Progress Notes (Unsigned)
COMPLETE PHYSICAL  Assessment and Plan:   [name] Smith was seen today for annual exam.  Diagnoses and all orders for this visit:  Encounter for general adult medical examination with abnormal findings Yearly  Essential hypertension Continue current medications:Metoprolol SR 25mg , half tablet daily. Monitor blood pressure at home; call if consistently over 130/80 Continue DASH diet.   Reminder to go to the ER if any CP, SOB, nausea, dizziness, severe HA, changes vision/speech, left arm numbness and tingling and jaw pain. -     CBC with Differential/Platelet -     COMPLETE METABOLIC PANEL WITH GFR -     TSH -     Magnesium -     EKG 12-Lead  Hyperlipidemia, unspecified hyperlipidemia type Discussed dietary and exercise modifications Low fat diet -     Lipid panel  Vitamin D deficiency Continue supplementation to maintain goal of 70-100 Taking Vitamin D 6,000 IU daily -     VITAMIN D 25 Hydroxy (Vit-D Deficiency, Fractures)  Abnormal glucose Discussed dietary and exercise modifications -     Hemoglobin A1c  Recurrent major depressive disorder, in partial remission (HCC) Generalized anxiety disorder Discussed stress management techniques  Discussed, increase water,intake & good sleep hygiene  Discussed increasing exercise & vegetables in diet  Needs flu shot -     FLU VACCINE MDCK QUAD W/Preservative  Screening for ischemic heart disease EKG  Screening for hematuria or proteinuria Routine UA with reflex microscopic Microalbumin/creatinine urine ratio  Diarrhea, unspecified type Discussed metamucil or probiotic Continue to monitor   Osteopenia, unspecified location Has DEXA scheduled for 03/2021  DDD (degenerative disc disease), cervical Managing at this time Has seen Dr 04/2021 in past.  Migraine with aura and without status migrainosus, not intractable-      - can not tolerate imitrex due to palpitations/heart issues, given sample and RX for nurtec Was not able to  get insurance to cover Nurtec previously but has failed an SSNRI(effexor) and triptan caused increased palpitations and can not take     Discussed med's effects and SE's. Screening labs and tests as requested with regular follow-up as recommended. Over 45 minutes of face to face interview, exam, counseling, chart review, and complex, high level critical decision making was performed this visit.   HPI 56 y.o. female  presents for a complete physical.  She has tried sumatriptan and did not tolerate related to increased palpitations. Was also on Venlafaxine with no results. Pt is getting 2-4 migraines a month currently. Sensitivity to light/sound/ nausea and diarrhea associated with headaches.   CT head 2012 MRI brain 2015 Sleep study 2017- negative   Her blood pressure has been controlled at home, today their BP is    She does not workout due to chronic pain of back/neck.  She denies chest pain, shortness of breath, dizziness.  BMI is There is no height or weight on file to calculate BMI., she is not working on diet and exercise currently, has increased back pain-lumbar.  Previously used steroid injections but Ortho now states its arthritis not a degeneration issue.  She is also having knees, ankles hands and back pain.   Wt Readings from Last 3 Encounters:  01/02/22 162 lb 3.2 oz (73.6 kg)  09/23/21 161 lb 3.2 oz (73.1 kg)  04/10/21 163 lb (73.9 kg)   She follows with Dr. 04/12/21 for palpitations follow yearly.  Upcoming  1/-2023 She is not on cholesterol medication and denies myalgias. Her cholesterol is at goal. The cholesterol last visit  was:   Lab Results  Component Value Date   CHOL 176 09/23/2021   HDL 48 (L) 09/23/2021   LDLCALC 112 (H) 09/23/2021   TRIG 72 09/23/2021   CHOLHDL 3.7 09/23/2021    Last A1C in the office was:  Lab Results  Component Value Date   HGBA1C 5.3 02/19/2021   Patient is on Vitamin D supplement, she is on 6000 IU daily.   Lab Results  Component  Value Date   VD25OH 30 02/19/2021     Following with Dr. Jennette Kettle, GYN, she is off the estrogen patch.  She follows with pain management and is on hydrocodone for that.  She is on xanax AS needed, very low dose 0.25mg , last refill was 08/09/2017 for 60 and she states she still has some.   Current Medications:  Current Outpatient Medications on File Prior to Visit  Medication Sig   alendronate (FOSAMAX) 70 MG tablet Take 1 tablet (70 mg total) by mouth every 7 (seven) days. Take with a full glass of water on an empty stomach.   busPIRone (BUSPAR) 5 MG tablet Take 1 tablet (5 mg total) by mouth 3 (three) times daily.   Cetirizine HCl (ZYRTEC PO) Take 1 tablet by mouth daily as needed. Allertec brand   Cholecalciferol (VITAMIN D) 2000 UNITS CAPS Take 12,000 Units by mouth daily. (Patient not taking: Reported on 09/23/2021)   Collagen-Boron-Hyaluronic Acid (MOVE FREE ULTRA JOINT HEALTH PO) Take 1 tablet by mouth daily. (Patient not taking: Reported on 09/23/2021)   Cyanocobalamin 2500 MCG TABS Take by mouth. (Patient not taking: Reported on 09/23/2021)   HYDROcodone-acetaminophen (NORCO/VICODIN) 5-325 MG tablet Take 1 tablet by mouth every 6 (six) hours as needed for moderate pain.   meclizine (ANTIVERT) 12.5 MG tablet Take 1-2 tablets by mouth 3 (three) times daily as needed for dizziness   metoprolol succinate (TOPROL-XL) 25 MG 24 hr tablet TAKE 1/2 TABLET BY MOUTH EVERY DAY   venlafaxine XR (EFFEXOR XR) 37.5 MG 24 hr capsule Take 1 capsule (37.5 mg total) by mouth daily.   No current facility-administered medications on file prior to visit.    Health Maintenance:   Immunization History  Administered Date(s) Administered   Influenza Inj Mdck Quad With Preservative 01/24/2020   Influenza,inj,Quad PF,6+ Mos 02/19/2021   Tdap 10/22/2011   Tetanus: 2013 Pneumovax: N/A Prevnar 13: due age 74 Flu vaccine: 01/24/20 Zostavax: N/A  Pap: 2017 get with Dr. Jennette Kettle scheduled 03/2021 3DMGM: 10/2017  gets  with Dr. Jennette Kettle, Scheduled 03/2021 DEXA: 2017 Osteopenia Dr. Jennette Kettle  Scheduled 03/2021 Colonoscopy: Cologuard 11/21/20 Negative CXR 11/2015 MRI cervical 2015 MRI brain 2015 Sleep study 2017 Echo 02/2016  Eye: Dr. Emily Filbert, Due for 2022 Dental Exam complete 2022 : Patient Care Team: Lucky Cowboy, MD as PCP - General (Internal Medicine) Nahser, Deloris Ping, MD as PCP - Cardiology (Cardiology) Manning Charity, OD as Referring Physician (Optometry) Freddy Finner, MD (Inactive) as Consulting Physician (Obstetrics and Gynecology) Janalyn Harder, MD (Inactive) as Consulting Physician (Dermatology) Charna Elizabeth, MD as Consulting Physician (Gastroenterology)  Medical History:  Past Medical History:  Diagnosis Date   Allergy    Anemia    Arthritis    in hands   DDD (degenerative disc disease)    Headache    Hyperlipidemia    diet controlled   Hypertension    Neuropathy    PONV (postoperative nausea and vomiting)    Unspecified vitamin D deficiency    Allergies Allergies  Allergen Reactions   Erythromycin Hives  Misc. Sulfonamide Containing Compounds    Penicillins Hives   Prednisone Other (See Comments)    pain   Tetracyclines & Related Hives   Lexapro [Escitalopram] Anxiety    Jittery, headache    SURGICAL HISTORY She  has a past surgical history that includes Diagnostic laparoscopy; Abdominal hysterectomy; Tonsillectomy; Cervical disc surgery; Anterior cervical decomp/discectomy fusion (03/29/2012); and Cesarean section. FAMILY HISTORY Her family history includes Atrial fibrillation in her mother; Cancer in her father and maternal grandfather; Diabetes in her father and maternal aunt; Heart disease in her father; Hyperlipidemia in her father and mother; Hypertension in her father and mother. SOCIAL HISTORY She  reports that she quit smoking about 10 years ago. Her smoking use included cigarettes. She has never used smokeless tobacco. She reports that she does not drink alcohol and  does not use drugs.  Review of Systems  Constitutional:  Positive for malaise/fatigue. Negative for chills and fever.  HENT:  Negative for congestion, hearing loss, sinus pain, sore throat and tinnitus.   Eyes:  Positive for blurred vision (with migraine) and photophobia (with migraine). Negative for double vision.  Respiratory:  Negative for cough, hemoptysis, sputum production, shortness of breath and wheezing.   Cardiovascular:  Negative for chest pain, palpitations and leg swelling.  Gastrointestinal:  Negative for abdominal pain, constipation, diarrhea, heartburn, nausea and vomiting.  Genitourinary:  Negative for dysuria and urgency.  Musculoskeletal:  Positive for back pain and joint pain. Negative for falls, myalgias and neck pain.  Skin:  Negative for rash.  Neurological:  Positive for headaches. Negative for dizziness, tingling, tremors and weakness.  Endo/Heme/Allergies:  Does not bruise/bleed easily.  Psychiatric/Behavioral:  Negative for depression and suicidal ideas. The patient is not nervous/anxious and does not have insomnia.     Physical Exam: Estimated body mass index is 26.99 kg/m as calculated from the following:   Height as of 01/02/22: 5\' 5"  (1.651 m).   Weight as of 01/02/22: 162 lb 3.2 oz (73.6 kg). There were no vitals taken for this visit. General Appearance: Well nourished, in no apparent distress. Eyes: PERRLA, EOMs, conjunctiva no swelling or erythema, normal fundi and vessels. Sinuses: No Frontal/maxillary tenderness ENT/Mouth: Ext aud canals clear, normal light reflex with TMs without erythema, bulging.  Good dentition. No erythema, swelling, or exudate on post pharynx. Tonsils not swollen or erythematous. Hearing normal.  Neck: Supple, thyroid with some nodules and possible very slight goiter. No bruits Respiratory: Respiratory effort normal, BS equal bilaterally without rales, rhonchi, wheezing or stridor. Cardio: RRR without murmurs, rubs or gallops.  Brisk peripheral pulses without edema.  Chest: symmetric, with normal excursions and percussion. Breasts: defer Abdomen: Soft, +BS, no guarding, hernias, masses, or organomegaly. .  Lymphatics: Non tender without lymphadenopathy.  Genitourinary: defer Musculoskeletal: Full ROM all peripheral extremities,5/5 strength, and normal gait. Skin: Warm, dry without rashes, lesions, ecchymosis.  Neuro: Cranial nerves intact, reflexes equal bilaterally. Normal muscle tone, no cerebellar symptoms. Sensation intact.  Psych: Awake and oriented X 3, normal affect, Insight and Judgment appropriate.   EKG: Sinus bradycardia, IRBBB on Metoprolol, No ST changes  01/04/22 [facility]  03/04/2022

## 2022-03-05 ENCOUNTER — Encounter: Payer: Self-pay | Admitting: Nurse Practitioner

## 2022-03-05 ENCOUNTER — Ambulatory Visit (INDEPENDENT_AMBULATORY_CARE_PROVIDER_SITE_OTHER): Payer: 59 | Admitting: Nurse Practitioner

## 2022-03-05 VITALS — BP 124/80 | HR 84 | Temp 97.5°F | Ht 64.5 in | Wt 161.4 lb

## 2022-03-05 DIAGNOSIS — Z23 Encounter for immunization: Secondary | ICD-10-CM

## 2022-03-05 DIAGNOSIS — Z1329 Encounter for screening for other suspected endocrine disorder: Secondary | ICD-10-CM

## 2022-03-05 DIAGNOSIS — Z0000 Encounter for general adult medical examination without abnormal findings: Secondary | ICD-10-CM | POA: Diagnosis not present

## 2022-03-05 DIAGNOSIS — E559 Vitamin D deficiency, unspecified: Secondary | ICD-10-CM

## 2022-03-05 DIAGNOSIS — E785 Hyperlipidemia, unspecified: Secondary | ICD-10-CM

## 2022-03-05 DIAGNOSIS — Z79899 Other long term (current) drug therapy: Secondary | ICD-10-CM

## 2022-03-05 DIAGNOSIS — I10 Essential (primary) hypertension: Secondary | ICD-10-CM | POA: Diagnosis not present

## 2022-03-05 DIAGNOSIS — Z136 Encounter for screening for cardiovascular disorders: Secondary | ICD-10-CM | POA: Diagnosis not present

## 2022-03-05 DIAGNOSIS — G43109 Migraine with aura, not intractable, without status migrainosus: Secondary | ICD-10-CM

## 2022-03-05 DIAGNOSIS — Z0001 Encounter for general adult medical examination with abnormal findings: Secondary | ICD-10-CM

## 2022-03-05 DIAGNOSIS — E663 Overweight: Secondary | ICD-10-CM

## 2022-03-05 DIAGNOSIS — F3341 Major depressive disorder, recurrent, in partial remission: Secondary | ICD-10-CM

## 2022-03-05 DIAGNOSIS — I700 Atherosclerosis of aorta: Secondary | ICD-10-CM | POA: Diagnosis not present

## 2022-03-05 DIAGNOSIS — R7309 Other abnormal glucose: Secondary | ICD-10-CM

## 2022-03-05 DIAGNOSIS — M8580 Other specified disorders of bone density and structure, unspecified site: Secondary | ICD-10-CM

## 2022-03-05 DIAGNOSIS — Z1389 Encounter for screening for other disorder: Secondary | ICD-10-CM

## 2022-03-05 DIAGNOSIS — F411 Generalized anxiety disorder: Secondary | ICD-10-CM

## 2022-03-05 DIAGNOSIS — M5030 Other cervical disc degeneration, unspecified cervical region: Secondary | ICD-10-CM

## 2022-03-05 MED ORDER — PHENTERMINE HCL 37.5 MG PO TABS: ORAL_TABLET | ORAL | 5 refills | Status: DC

## 2022-03-05 NOTE — Patient Instructions (Signed)
Fair life protein shakes Eat more frequently - try not to go more than 6 hours without protein Aim for 90 grams of protein a day- 30 breakfast/30 lunch 30 dinner Try to keep net carbs less than 50 Net Carbs=Total Carbs-fiber- sugar alcohols Exercise heartrate 120-140(fat burning zone)- walking 20-30 minutes 4 days a week  

## 2022-03-06 ENCOUNTER — Other Ambulatory Visit: Payer: Self-pay | Admitting: Nurse Practitioner

## 2022-03-06 DIAGNOSIS — E785 Hyperlipidemia, unspecified: Secondary | ICD-10-CM

## 2022-03-06 LAB — COMPLETE METABOLIC PANEL WITH GFR
AG Ratio: 1.6 (calc) (ref 1.0–2.5)
ALT: 12 U/L (ref 6–29)
AST: 14 U/L (ref 10–35)
Albumin: 4.4 g/dL (ref 3.6–5.1)
Alkaline phosphatase (APISO): 63 U/L (ref 37–153)
BUN: 10 mg/dL (ref 7–25)
CO2: 28 mmol/L (ref 20–32)
Calcium: 10.3 mg/dL (ref 8.6–10.4)
Chloride: 103 mmol/L (ref 98–110)
Creat: 0.65 mg/dL (ref 0.50–1.03)
Globulin: 2.8 g/dL (calc) (ref 1.9–3.7)
Glucose, Bld: 107 mg/dL — ABNORMAL HIGH (ref 65–99)
Potassium: 4.4 mmol/L (ref 3.5–5.3)
Sodium: 142 mmol/L (ref 135–146)
Total Bilirubin: 0.3 mg/dL (ref 0.2–1.2)
Total Protein: 7.2 g/dL (ref 6.1–8.1)
eGFR: 102 mL/min/{1.73_m2} (ref >=60)

## 2022-03-06 LAB — CBC WITH DIFFERENTIAL/PLATELET
Absolute Monocytes: 303 cells/uL (ref 200–950)
Basophils Absolute: 72 cells/uL (ref 0–200)
Basophils Relative: 1.3 %
Eosinophils Absolute: 132 cells/uL (ref 15–500)
Eosinophils Relative: 2.4 %
HCT: 37.1 % (ref 35.0–45.0)
Hemoglobin: 12.6 g/dL (ref 11.7–15.5)
Lymphs Abs: 1936 cells/uL (ref 850–3900)
MCH: 29.7 pg (ref 27.0–33.0)
MCHC: 34 g/dL (ref 32.0–36.0)
MCV: 87.5 fL (ref 80.0–100.0)
MPV: 9.7 fL (ref 7.5–12.5)
Monocytes Relative: 5.5 %
Neutro Abs: 3058 cells/uL (ref 1500–7800)
Neutrophils Relative %: 55.6 %
Platelets: 352 10*3/uL (ref 140–400)
RBC: 4.24 10*6/uL (ref 3.80–5.10)
RDW: 12.8 % (ref 11.0–15.0)
Total Lymphocyte: 35.2 %
WBC: 5.5 10*3/uL (ref 3.8–10.8)

## 2022-03-06 LAB — MICROALBUMIN / CREATININE URINE RATIO
Creatinine, Urine: 194 mg/dL (ref 20–275)
Microalb Creat Ratio: 2 mcg/mg creat (ref <30)
Microalb, Ur: 0.4 mg/dL

## 2022-03-06 LAB — URINALYSIS W MICROSCOPIC + REFLEX CULTURE
Bilirubin Urine: NEGATIVE
Glucose, UA: NEGATIVE
Hgb urine dipstick: NEGATIVE
Ketones, ur: NEGATIVE
Leukocyte Esterase: NEGATIVE
Nitrites, Initial: NEGATIVE
Protein, ur: NEGATIVE
Specific Gravity, Urine: 1.022 (ref 1.001–1.035)
pH: 6 (ref 5.0–8.0)

## 2022-03-06 LAB — LIPID PANEL
Cholesterol: 187 mg/dL (ref <200)
HDL: 50 mg/dL (ref >=50)
LDL Cholesterol (Calc): 116 mg/dL (calc) — ABNORMAL HIGH
Non-HDL Cholesterol (Calc): 137 mg/dL (calc) — ABNORMAL HIGH (ref <130)
Total CHOL/HDL Ratio: 3.7 (calc) (ref <5.0)
Triglycerides: 106 mg/dL (ref <150)

## 2022-03-06 LAB — HEMOGLOBIN A1C
Hgb A1c MFr Bld: 5.5 % of total Hgb (ref <5.7)
Mean Plasma Glucose: 111 mg/dL
eAG (mmol/L): 6.2 mmol/L

## 2022-03-06 LAB — MAGNESIUM: Magnesium: 2.1 mg/dL (ref 1.5–2.5)

## 2022-03-06 LAB — NO CULTURE INDICATED

## 2022-03-06 LAB — VITAMIN D 25 HYDROXY (VIT D DEFICIENCY, FRACTURES): Vit D, 25-Hydroxy: 29 ng/mL — ABNORMAL LOW (ref 30–100)

## 2022-03-06 LAB — TSH: TSH: 1.01 mIU/L (ref 0.40–4.50)

## 2022-03-06 MED ORDER — ROSUVASTATIN CALCIUM 5 MG PO TABS: 5.0000 mg | ORAL_TABLET | Freq: Every day | ORAL | 11 refills | Status: DC

## 2022-05-14 LAB — HM MAMMOGRAPHY

## 2022-05-17 ENCOUNTER — Other Ambulatory Visit: Payer: Self-pay | Admitting: Cardiovascular Disease

## 2022-05-19 ENCOUNTER — Other Ambulatory Visit: Payer: Self-pay | Admitting: Nurse Practitioner

## 2022-05-19 DIAGNOSIS — M810 Age-related osteoporosis without current pathological fracture: Secondary | ICD-10-CM

## 2022-05-30 ENCOUNTER — Telehealth: Payer: Self-pay | Admitting: Cardiovascular Disease

## 2022-05-30 MED ORDER — METOPROLOL SUCCINATE ER 25 MG PO TB24: 12.5000 mg | ORAL_TABLET | Freq: Every day | ORAL | 0 refills | Status: DC

## 2022-05-30 NOTE — Telephone Encounter (Signed)
Pt's medication was sent to pt's pharmacy as requested. Confirmation received.  °

## 2022-05-30 NOTE — Telephone Encounter (Signed)
*  STAT* If patient is at the pharmacy, call can be transferred to refill team.   1. Which medications need to be refilled? (please list name of each medication and dose if known)  metoprolol succinate (TOPROL-XL) 25 MG 24 hr tablet  2. Which pharmacy/location (including street and city if local pharmacy) is medication to be sent to? [id number] [place], [place] - [address]  3. Do they need a 30 day or 90 day supply?  90 day supply

## 2022-08-04 NOTE — Progress Notes (Unsigned)
Cardiology Office Note   Date:  08/05/2022   ID:  [name] Smith, DOB December 15, 1965, MRN [id number]  PCP:  Lucky Cowboy, MD  Cardiologist:   Kristeen Miss, MD   Chief Complaint  Patient presents with   Mitral Valve Prolapse   Problem list 1. Palpitations 2. Hyperlipidemia 3. Cervical spine surgery - is on Lyrica for neuropathy pain .     [name] [facility]  [facility] ) is a 57 y.o. female who presents for further evaluation of her palpitations.  ( Daughter of [name] )   Has noticed some palpitations on occasion.     Intermittent   It's intermittent and not constant. She's going through menopause. These palpitations have been occurring for the past couple of months. There is no associated syncope or presyncope. There is no chest pain or shortness breath.  Does not get regular exercise .  Just got a new puppy .   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   Feb. 13, 2018:  [name] Smith  was seen several months ago for palpitations. She had an echocardiogram on 02/20/2015 which reveals normal left ventricular systolic function. She has grade 2 diastolic dysfunction.  Has been diagnosed with osteoperosis. She had stopped the hormone patch Her doctor wanted her to ask me about hormone replacement therapy  Aug 04, 2016:  Echo showed normal LV systolic function. Grade 2 diastolic dysfunction,  No MVP We Try Toprol-XL 25 mg a day but this did not agree with her. She is now cutting the Toprol in half and is tolerating 12.5 kg today. Is not exercising   Jul 31, 2017: [name] Smith is seen back today for follow up of her PVCs Still has fatigue ,   Has been found to have some thyroid nodules Has had some weight gain.   Is weaning off her pain meds.  Has lost 7 lbs in the past 2 weeks,   Has really cut her carbs  Is using a recumbant bike on occasion .   Sept. 3, 2020  [name] Smith is seen to follow up for her PVCs.   Wt today is 150 lbs  We started Toprol XL last year.  Trying to work  out .   Has had some plantar fasciatis. She has a mid systolic click on exam today   Nov. 23, 2021: [name] Smith is seen today for follow up of her PVCs Has occasional palpitations Does not get much exercise,   Taking care of her father [name]) who is getting dementia.  Had covid back in May.  Has recovered .   Jan. 17, 2023 [name] Smith is seen for follow up of her PVCs and mild MVP  Wt is 162 lbs .  Her father , [name] passed away this past year  Has had more DOE Has gained weight .  Does not eat that much .  Is seening a rheumatologist  ANA is +    - following up with rheum  soon  Is not exercising much    Aug 05, 2022 [name] Smith is seen for follow up of her PVCs and MVP  Had an episode of tachycardia  Lasted perhaps a minute .  Was not associated with dizziness, or lightheadedness Has had before before we started the metoprolol  Wt is 154.  Is on phentermine for weight loss  We discussed that this would also make her tachycardic .  Past Medical History:  Diagnosis Date   Allergy  Anemia    Arthritis    in hands   DDD (degenerative disc disease)    Headache    Hyperlipidemia    diet controlled   Hypertension    Neuropathy    PONV (postoperative nausea and vomiting)    Unspecified vitamin D deficiency     Past Surgical History:  Procedure Laterality Date   ABDOMINAL HYSTERECTOMY     ANTERIOR CERVICAL DECOMP/DISCECTOMY FUSION  03/29/2012   Procedure: ANTERIOR CERVICAL DECOMPRESSION/DISCECTOMY FUSION 1 LEVEL;  Surgeon: Temple Pacini, MD;  Location: [facility];  Service: Neurosurgery;  Laterality: Bilateral;  Cervical four-five anterior cervical discectomy with fusion    CERVICAL DISC SURGERY     CESAREAN SECTION     DIAGNOSTIC LAPAROSCOPY     TONSILLECTOMY       Current Outpatient Medications  Medication Sig Dispense Refill   alendronate (FOSAMAX) 70 MG tablet TAKE 1 TABLET (70 MG TOTAL) BY MOUTH EVERY 7 DAYS WITH FULL GLASS WATER ON EMPTY STOMACH 4 tablet 11   busPIRone  (BUSPAR) 5 MG tablet Take 1 tablet (5 mg total) by mouth 3 (three) times daily. (Patient taking differently: Take 5 mg by mouth 3 (three) times daily as needed.) 90 tablet 3   Cetirizine HCl (ZYRTEC PO) Take 1 tablet by mouth daily as needed. Allertec brand     Cholecalciferol (VITAMIN D) 2000 UNITS CAPS Take 12,000 Units by mouth daily.     Collagen-Boron-Hyaluronic Acid (MOVE FREE ULTRA JOINT HEALTH PO) Take 1 tablet by mouth daily.     Cyanocobalamin 2500 MCG TABS Take by mouth.     HYDROcodone-acetaminophen (NORCO/VICODIN) 5-325 MG tablet Take 1 tablet by mouth every 6 (six) hours as needed for moderate pain.     meclizine (ANTIVERT) 12.5 MG tablet Take 1-2 tablets by mouth 3 (three) times daily as needed for dizziness 30 tablet 0   metoprolol succinate (TOPROL-XL) 25 MG 24 hr tablet Take 0.5 tablets (12.5 mg total) by mouth daily. 45 tablet 0   phentermine (ADIPEX-P) 37.5 MG tablet Take 1/2 to 1 tablet every morning for dieting & weightloss 30 tablet 5   rosuvastatin (CRESTOR) 5 MG tablet Take 1 tablet (5 mg total) by mouth daily. 30 tablet 11   meloxicam (MOBIC) 15 MG tablet Take 15 mg by mouth daily. (Patient not taking: Reported on 08/05/2022)     venlafaxine XR (EFFEXOR XR) 37.5 MG 24 hr capsule Take 1 capsule (37.5 mg total) by mouth daily. (Patient not taking: Reported on 03/05/2022) 30 capsule 2   No current facility-administered medications for this visit.    Allergies:   Erythromycin, Misc. sulfonamide containing compounds, Penicillins, Prednisone, Tetracyclines & related, and Lexapro [escitalopram]   Social History:  The patient  reports that she quit smoking about 10 years ago. Her smoking use included cigarettes. She has never used smokeless tobacco. She reports that she does not drink alcohol and does not use drugs.   Family History:  The patient's family history includes Atrial fibrillation in her mother; Cancer in her father and maternal grandfather; Diabetes in her father and  maternal aunt; Heart disease in her father; Hyperlipidemia in her father and mother; Hypertension in her father and mother.    ROS: Noted in current history, otherwise review of systems is negative.  Physical Exam: Blood pressure 126/72, pulse 85, height 5' 4.5" (1.638 m), weight 154 lb 3.2 oz (69.9 kg), SpO2 97 %.       GEN:  Well nourished, well developed in no  acute distress HEENT: Normal NECK: No JVD; No carotid bruits LYMPHATICS: No lymphadenopathy CARDIAC: RRR, mid systolic click.  Very soft systolic murmur  RESPIRATORY:  Clear to auscultation without rales, wheezing or rhonchi  ABDOMEN: Soft, non-tender, non-distended MUSCULOSKELETAL:  No edema; No deformity  SKIN: Warm and dry NEUROLOGIC:  Alert and oriented x 3     EKG:      Recent Labs: 03/05/2022: ALT 12; BUN 10; Creat 0.65; Hemoglobin 12.6; Magnesium 2.1; Platelets 352; Potassium 4.4; Sodium 142; TSH 1.01    Lipid Panel    Component Value Date/Time   CHOL 187 03/05/2022 1503   TRIG 106 03/05/2022 1503   HDL 50 03/05/2022 1503   CHOLHDL 3.7 03/05/2022 1503   VLDL 14 11/28/2015 1506   LDLCALC 116 (H) 03/05/2022 1503      Wt Readings from Last 3 Encounters:  08/05/22 154 lb 3.2 oz (69.9 kg)  03/05/22 161 lb 6.4 oz (73.2 kg)  01/02/22 162 lb 3.2 oz (73.6 kg)      Other studies Reviewed: Additional studies/ records that were reviewed today include: . Review of the above records demonstrates:    ASSESSMENT AND PLAN:  1.  Palpitations:   She continues to have episodes of tachycardia.  She anticipates using phentermine in the near future to help with weight loss.  I have cautioned her about the fact that this may cause some worsening episodes of tachycardia.  Will increase the Toprol-XL to 25 mg a day.  Will also give her propranolol to take on a 10 mg 4 times a day as needed for tachycardia.   2.  mitral valve prolapse. :   Has a definite midsystolic click with very soft systolic murmur.  I will see  her in 6 months for follow-up visit.      Current medicines are reviewed at length with the patient today.  The patient does not have concerns regarding medicines.  Labs/ tests ordered today include:   No orders of the defined types were placed in this encounter.  Disposition:   FU with me in  6 months   Kristeen Miss, MD  08/05/2022 2:45 PM    [facility] [address] [place], [place], [place]  [address] Phone: [phone]; Fax: [phone]

## 2022-08-05 ENCOUNTER — Ambulatory Visit: Payer: 59 | Attending: Cardiovascular Disease | Admitting: Cardiovascular Disease

## 2022-08-05 ENCOUNTER — Encounter: Payer: Self-pay | Admitting: Cardiovascular Disease

## 2022-08-05 VITALS — BP 126/72 | HR 85 | Ht 64.5 in | Wt 154.2 lb

## 2022-08-05 DIAGNOSIS — I10 Essential (primary) hypertension: Secondary | ICD-10-CM | POA: Diagnosis not present

## 2022-08-05 DIAGNOSIS — I493 Ventricular premature depolarization: Secondary | ICD-10-CM

## 2022-08-05 DIAGNOSIS — I341 Nonrheumatic mitral (valve) prolapse: Secondary | ICD-10-CM | POA: Diagnosis not present

## 2022-08-05 MED ORDER — PROPRANOLOL HCL 10 MG PO TABS: 10.0000 mg | ORAL_TABLET | Freq: Four times a day (QID) | ORAL | 3 refills | Status: AC | PRN

## 2022-08-05 MED ORDER — METOPROLOL SUCCINATE ER 25 MG PO TB24: 25.0000 mg | ORAL_TABLET | Freq: Every day | ORAL | 3 refills | Status: DC

## 2022-08-05 NOTE — Patient Instructions (Signed)
Medication Instructions:  Your physician has recommended you make the following change in your medication:  1-INCREASE Metoprolol 25 mg by mouth daily  2-TAKE Propranolol 10 mg by mouth as needed every 6 hours for tachycardia.   *If you need a refill on your cardiac medications before your next appointment, please call your pharmacy*  Lab Work: If you have labs (blood work) drawn today and your tests are completely normal, you will receive your results only by: MyChart Message (if you have MyChart) OR A paper copy in the mail If you have any lab test that is abnormal or we need to change your treatment, we will call you to review the results.  Testing/Procedures: None ordered today.  Follow-Up: At [facility], you and your health needs are our priority.  As part of our continuing mission to provide you with exceptional heart care, we have created designated Provider Care Teams.  These Care Teams include your primary Cardiologist (physician) and Advanced Practice Providers (APPs -  Physician Assistants and Nurse Practitioners) who all work together to provide you with the care you need, when you need it.  We recommend signing up for the patient portal called "MyChart".  Sign up information is provided on this After Visit Summary.  MyChart is used to connect with patients for Virtual Visits (Telemedicine).  Patients are able to view lab/test results, encounter notes, upcoming appointments, etc.  Non-urgent messages can be sent to your provider as well.   To learn more about what you can do with MyChart, go to [email or web address].    Your next appointment:   6 month(s)  Provider:   Kristeen Miss, MD

## 2022-09-23 ENCOUNTER — Telehealth: Payer: Self-pay | Admitting: Nurse Practitioner

## 2022-09-23 ENCOUNTER — Other Ambulatory Visit: Payer: Self-pay | Admitting: Nurse Practitioner

## 2022-09-23 DIAGNOSIS — E663 Overweight: Secondary | ICD-10-CM

## 2022-09-23 NOTE — Telephone Encounter (Signed)
PATIENT IS REQUESTING A REFILL [facility] [address]

## 2022-09-23 NOTE — Telephone Encounter (Signed)
She does receive hydrocodone monthly so should not receive another controlled substance from another provider.  Is she getting her medicine from a pain clinic and do they know she is taking phentermine

## 2022-09-24 ENCOUNTER — Other Ambulatory Visit: Payer: Self-pay | Admitting: Nurse Practitioner

## 2022-09-24 DIAGNOSIS — E663 Overweight: Secondary | ICD-10-CM

## 2022-09-24 NOTE — Telephone Encounter (Signed)
Spoke with patient, she is going to keep her appointment on Monday to see Annabelle Harman to discuss further.

## 2022-09-29 ENCOUNTER — Encounter: Payer: Self-pay | Admitting: Nurse Practitioner

## 2022-09-29 ENCOUNTER — Ambulatory Visit (INDEPENDENT_AMBULATORY_CARE_PROVIDER_SITE_OTHER): Payer: 59 | Admitting: Nurse Practitioner

## 2022-09-29 VITALS — BP 114/72 | HR 73 | Temp 97.7°F | Ht 64.5 in | Wt 153.4 lb

## 2022-09-29 DIAGNOSIS — F3341 Major depressive disorder, recurrent, in partial remission: Secondary | ICD-10-CM

## 2022-09-29 DIAGNOSIS — R7309 Other abnormal glucose: Secondary | ICD-10-CM | POA: Diagnosis not present

## 2022-09-29 DIAGNOSIS — E785 Hyperlipidemia, unspecified: Secondary | ICD-10-CM

## 2022-09-29 DIAGNOSIS — E663 Overweight: Secondary | ICD-10-CM

## 2022-09-29 DIAGNOSIS — I10 Essential (primary) hypertension: Secondary | ICD-10-CM

## 2022-09-29 DIAGNOSIS — E559 Vitamin D deficiency, unspecified: Secondary | ICD-10-CM | POA: Diagnosis not present

## 2022-09-29 DIAGNOSIS — G43109 Migraine with aura, not intractable, without status migrainosus: Secondary | ICD-10-CM

## 2022-09-29 DIAGNOSIS — F411 Generalized anxiety disorder: Secondary | ICD-10-CM

## 2022-09-29 DIAGNOSIS — Z79899 Other long term (current) drug therapy: Secondary | ICD-10-CM

## 2022-09-29 LAB — CBC WITH DIFFERENTIAL/PLATELET
Absolute Monocytes: 377 cells/uL (ref 200–950)
Basophils Absolute: 82 cells/uL (ref 0–200)
Basophils Relative: 1.6 %
MPV: 10.3 fL (ref 7.5–12.5)
Neutrophils Relative %: 51.7 %
RDW: 12.4 % (ref 11.0–15.0)
WBC: 5.1 10*3/uL (ref 3.8–10.8)

## 2022-09-29 MED ORDER — PHENTERMINE HCL 37.5 MG PO TABS: ORAL_TABLET | ORAL | 0 refills | Status: DC

## 2022-09-29 NOTE — Progress Notes (Signed)
FOLLOW UP 6 MONTH  Assessment and Plan:  Essential hypertension Continue current medications:Metoprolol SR 25mg , half tablet daily. Monitor blood pressure at home; call if consistently over 130/80 Continue DASH diet.   Reminder to go to the ER if any CP, SOB, nausea, dizziness, severe HA, changes vision/speech, left arm numbness and tingling and jaw pain.  Overweight Long discussion about weight loss, diet, and exercise Recommended diet heavy in fruits and veggies and low in animal meats, cheeses, and dairy products, appropriate calorie intake Patient will work on decreasing saturated fats and simple carbs  Increase lean proteins and activity as tolerated Continue Phentermine 37.5 mg 1 tab daily Follow up at next visit   Hyperlipidemia, unspecified hyperlipidemia type Discussed dietary and exercise modifications Low fat diet - Lipid panel   Vitamin D deficiency Continue supplementation to maintain goal of 70-100 Taking Vitamin D 6,000 IU daily - Vit D   Abnormal glucose Discussed dietary and exercise modifications   Recurrent major depressive disorder, in partial remission (HCC) Generalized anxiety disorder Continue Buspar 5 mg TID as needed Discussed stress management techniques  Discussed, increase water,intake & good sleep hygiene  Discussed increasing exercise & vegetables in diet   Migraine with aura and without status migrainosus, not intractable-      Currently controlled without medication  Medication Management - CBC, CMP   Discussed med's effects and SE's. Screening labs and tests as requested with regular follow-up as recommended.  Over 30 minutes of face to face interview, exam, counseling, chart review, and complex, high level critical decision making was performed this visit.  Future Appointments  Date Time Provider Department Center  01/27/2023  2:00 PM Nahser, Deloris Ping, MD [facility]  03/09/2023  2:00 PM Raynelle Dick, NP  [facility] None    HPI 57 y.o. female  presents for three month follow up on HTN, HLD, Overweight , palpitations.    04/2021 she turned her head and felt something pop in her neck and developed numbness and tingling down right arm.  C2 had slipped out of fusion . Dr. [name] performed reconstruction of cervical fusion .  Pain in neck and numbness in arms has improved.  Will still get occasional pain when sitting more. She is on hydrocodone /acetaminophen 5/325 mg  1 tab every six hours for pain through the pain clinic  Migraines are essentially nonexistent. She has tried sumatriptan and did not tolerate related to increased palpitations.  Mood is currently well controlled without Effexor, will use Buspar occasionally.   Her son was married in January and they have identical twins born  01/2022  CT head 2012 MRI brain 2015 Sleep study 2017- negative   Her blood pressure has been controlled at home, currently on Metoprolol 25 mg every day.  Will take Propranolol 10 mg as needed for palpitations up to q 6 hours has not had to use at all. Today their BP is BP: 114/72   BP Readings from Last 3 Encounters:  09/29/22 114/72  08/05/22 126/72  03/05/22 124/80  She does not workout due to chronic pain of back/neck.  She denies chest pain, shortness of breath, dizziness.  She goes to pain management.   BMI is Body mass index is 25.92 kg/m., she is working on diet and exercise.She is currently on Phentermine for weight loss and denies side effects of chest pain, shortness of breath. She has run Phentermine past the pain clinic and they are ok with use of this medication. She will take 1/2-1  at a time in the past.  Wt Readings from Last 3 Encounters:  09/29/22 153 lb 6.4 oz (69.6 kg)  08/05/22 154 lb 3.2 oz (69.9 kg)  03/05/22 161 lb 6.4 oz (73.2 kg)   She follows with Dr. Melburn Popper for palpitations follow yearly with last appointment 08/05/22 She is not on cholesterol medication and denies myalgias.  Her cholesterol is at goal. She is limiting dairy, eggs, red meat and fried foods. The cholesterol last visit was:   Lab Results  Component Value Date   CHOL 187 03/05/2022   HDL 50 03/05/2022   LDLCALC 116 (H) 03/05/2022   TRIG 106 03/05/2022   CHOLHDL 3.7 03/05/2022    Last A1C in the office was:  Lab Results  Component Value Date   HGBA1C 5.5 03/05/2022   Patient is on Vitamin D supplement, she is on 6000 IU daily.   Lab Results  Component Value Date   VD25OH 66 (L) 03/05/2022     Following with Dr. Jennette Kettle, GYN, she is off the estrogen patch.    Current Medications:  Current Outpatient Medications on File Prior to Visit  Medication Sig   alendronate (FOSAMAX) 70 MG tablet TAKE 1 TABLET (70 MG TOTAL) BY MOUTH EVERY 7 DAYS WITH FULL GLASS WATER ON EMPTY STOMACH   busPIRone (BUSPAR) 5 MG tablet Take 1 tablet (5 mg total) by mouth 3 (three) times daily. (Patient taking differently: Take 5 mg by mouth 3 (three) times daily as needed.)   Cetirizine HCl (ZYRTEC PO) Take 1 tablet by mouth daily as needed. Allertec brand   Cholecalciferol (VITAMIN D) 2000 UNITS CAPS Take 12,000 Units by mouth daily.   Cyanocobalamin 2500 MCG TABS Take by mouth.   HYDROcodone-acetaminophen (NORCO/VICODIN) 5-325 MG tablet Take 1 tablet by mouth every 6 (six) hours as needed for moderate pain.   meclizine (ANTIVERT) 12.5 MG tablet Take 1-2 tablets by mouth 3 (three) times daily as needed for dizziness   metoprolol succinate (TOPROL-XL) 25 MG 24 hr tablet Take 1 tablet (25 mg total) by mouth daily.   propranolol (INDERAL) 10 MG tablet Take 1 tablet (10 mg total) by mouth 4 (four) times daily as needed (tachycardia).   rosuvastatin (CRESTOR) 5 MG tablet Take 1 tablet (5 mg total) by mouth daily.   Collagen-Boron-Hyaluronic Acid (MOVE FREE ULTRA JOINT HEALTH PO) Take 1 tablet by mouth daily. (Patient not taking: Reported on 09/29/2022)   meloxicam (MOBIC) 15 MG tablet Take 15 mg by mouth daily. (Patient not  taking: Reported on 08/05/2022)   phentermine (ADIPEX-P) 37.5 MG tablet Take 1/2 to 1 tablet every morning for dieting & weightloss (Patient not taking: Reported on 09/29/2022)   venlafaxine XR (EFFEXOR XR) 37.5 MG 24 hr capsule Take 1 capsule (37.5 mg total) by mouth daily. (Patient not taking: Reported on 03/05/2022)   No current facility-administered medications on file prior to visit.    : Patient Care Team: Lucky Cowboy, MD as PCP - General (Internal Medicine) Nahser, Deloris Ping, MD as PCP - Cardiology (Cardiology) Manning Charity, OD as Referring Physician (Optometry) Freddy Finner, MD (Inactive) as Consulting Physician (Obstetrics and Gynecology) Janalyn Harder, MD (Inactive) as Consulting Physician (Dermatology) Charna Elizabeth, MD as Consulting Physician (Gastroenterology)  Medical History:  Past Medical History:  Diagnosis Date   Allergy    Anemia    Arthritis    in hands   DDD (degenerative disc disease)    Headache    Hyperlipidemia    diet controlled  Hypertension    Neuropathy    PONV (postoperative nausea and vomiting)    Unspecified vitamin D deficiency    Allergies Allergies  Allergen Reactions   Erythromycin Hives   Misc. Sulfonamide Containing Compounds    Penicillins Hives   Prednisone Other (See Comments)    pain   Tetracyclines & Related Hives   Lexapro [Escitalopram] Anxiety    Jittery, headache    SURGICAL HISTORY She  has a past surgical history that includes Diagnostic laparoscopy; Abdominal hysterectomy; Tonsillectomy; Cervical disc surgery; Anterior cervical decomp/discectomy fusion (03/29/2012); and Cesarean section. FAMILY HISTORY Her family history includes Atrial fibrillation in her mother; Cancer in her father and maternal grandfather; Diabetes in her father and maternal aunt; Heart disease in her father; Hyperlipidemia in her father and mother; Hypertension in her father and mother. SOCIAL HISTORY She  reports that she quit smoking about  11 years ago. Her smoking use included cigarettes. She started smoking about 14 years ago. She has never used smokeless tobacco. She reports that she does not drink alcohol and does not use drugs.  Review of Systems  Constitutional:  Negative for chills and fever.  HENT:  Negative for congestion, hearing loss, sinus pain, sore throat and tinnitus.   Eyes:  Negative for blurred vision and double vision.  Respiratory:  Negative for cough, hemoptysis, sputum production, shortness of breath and wheezing.   Cardiovascular:  Negative for chest pain, palpitations and leg swelling.  Gastrointestinal:  Negative for abdominal pain, constipation, diarrhea, heartburn, nausea and vomiting.  Genitourinary:  Negative for dysuria and urgency.  Musculoskeletal:  Positive for neck pain. Negative for back pain, falls, joint pain and myalgias.  Skin:  Negative for rash.  Neurological:  Negative for dizziness, tingling, tremors, weakness and headaches.  Endo/Heme/Allergies:  Does not bruise/bleed easily.  Psychiatric/Behavioral:  Positive for depression. Negative for suicidal ideas. The patient is nervous/anxious. The patient does not have insomnia.     Physical Exam: Estimated body mass index is 25.92 kg/m as calculated from the following:   Height as of this encounter: 5' 4.5" (1.638 m).   Weight as of this encounter: 153 lb 6.4 oz (69.6 kg). BP 114/72   Pulse 73   Temp 97.7 F (36.5 C)   Ht 5' 4.5" (1.638 m)   Wt 153 lb 6.4 oz (69.6 kg)   SpO2 97%   BMI 25.92 kg/m  General Appearance: Well nourished, in no apparent distress. Eyes: PERRLA, EOMs, conjunctiva no swelling or erythema Respiratory: Respiratory effort normal, BS equal bilaterally without rales, rhonchi, wheezing or stridor. Cardio: RRR without murmurs, rubs or gallops. Brisk peripheral pulses without edema.  Musculoskeletal: Full ROM all peripheral extremities,5/5 strength, and normal gait. Limited ROM of neck Skin: Warm, dry without  rashes, lesions, ecchymosis.  Neuro: Cranial nerves intact, reflexes equal bilaterally. Normal muscle tone, no cerebellar symptoms. Sensation intact.  Psych: Awake and oriented X 3, normal affect, Insight and Judgment appropriate.     [facility]  09/29/2022

## 2022-09-29 NOTE — Patient Instructions (Signed)
Fair life protein shakes Eat more frequently - try not to go more than 6 hours without protein Aim for 90 grams of protein a day- 30 breakfast/30 lunch 30 dinner Try to keep net carbs less than 50 Net Carbs=Total Carbs-fiber- sugar alcohols Exercise heartrate 120-140(fat burning zone)- walking 20-30 minutes 4 days a week  

## 2022-09-30 LAB — COMPLETE METABOLIC PANEL WITH GFR
AG Ratio: 2 (calc) (ref 1.0–2.5)
ALT: 9 U/L (ref 6–29)
AST: 12 U/L (ref 10–35)
Albumin: 4.5 g/dL (ref 3.6–5.1)
Alkaline phosphatase (APISO): 61 U/L (ref 37–153)
BUN: 14 mg/dL (ref 7–25)
CO2: 28 mmol/L (ref 20–32)
Calcium: 9.4 mg/dL (ref 8.6–10.4)
Chloride: 104 mmol/L (ref 98–110)
Creat: 0.56 mg/dL (ref 0.50–1.03)
Globulin: 2.3 g/dL (calc) (ref 1.9–3.7)
Glucose, Bld: 96 mg/dL (ref 65–99)
Potassium: 4.3 mmol/L (ref 3.5–5.3)
Sodium: 141 mmol/L (ref 135–146)
Total Bilirubin: 0.3 mg/dL (ref 0.2–1.2)
Total Protein: 6.8 g/dL (ref 6.1–8.1)
eGFR: 105 mL/min/{1.73_m2} (ref >=60)

## 2022-09-30 LAB — CBC WITH DIFFERENTIAL/PLATELET
Eosinophils Absolute: 179 cells/uL (ref 15–500)
Eosinophils Relative: 3.5 %
HCT: 36.9 % (ref 35.0–45.0)
Hemoglobin: 12 g/dL (ref 11.7–15.5)
Lymphs Abs: 1826 cells/uL (ref 850–3900)
MCH: 29.6 pg (ref 27.0–33.0)
MCHC: 32.5 g/dL (ref 32.0–36.0)
MCV: 90.9 fL (ref 80.0–100.0)
Monocytes Relative: 7.4 %
Neutro Abs: 2637 cells/uL (ref 1500–7800)
Platelets: 296 10*3/uL (ref 140–400)
RBC: 4.06 10*6/uL (ref 3.80–5.10)
Total Lymphocyte: 35.8 %

## 2022-09-30 LAB — VITAMIN D 25 HYDROXY (VIT D DEFICIENCY, FRACTURES): Vit D, 25-Hydroxy: 48 ng/mL (ref 30–100)

## 2022-09-30 LAB — LIPID PANEL
Cholesterol: 124 mg/dL (ref <200)
HDL: 44 mg/dL — ABNORMAL LOW (ref >=50)
LDL Cholesterol (Calc): 66 mg/dL (calc)
Non-HDL Cholesterol (Calc): 80 mg/dL (calc) (ref <130)
Total CHOL/HDL Ratio: 2.8 (calc) (ref <5.0)
Triglycerides: 68 mg/dL (ref <150)

## 2022-11-13 NOTE — Progress Notes (Signed)
Assessment and Plan:  [name] Smith was seen today for ear pain.  Diagnoses and all orders for this visit:  Essential hypertension - continue medications, DASH diet, exercise and monitor at home. Call if greater than 130/80.   Acute maxillary sinusitis, recurrence not specified Push fluids. Take Vit C and Vit D Mucinex twice a day Zithromax as directed  Start generic zyrtec daily -     azithromycin (ZITHROMAX) 250 MG tablet; Take 2 tablets (500 mg) on  Day 1,  followed by 1 tablet (250 mg) once daily on Days 2 through 5.       Further disposition pending results of labs. Discussed med's effects and SE's.   Over 30 minutes of exam, counseling, chart review, and critical decision making was performed.   Future Appointments  Date Time Provider Department Center  12/30/2022  2:30 PM [name] Dick, NP [facility] None  01/27/2023  2:00 PM Nahser, Deloris Ping, MD [facility]  03/09/2023  2:00 PM [name] Dick, NP [facility] None    ------------------------------------------------------------------------------------------------------------------   HPI BP 100/68   Pulse (!) 54   Temp 97.8 F (36.6 C)   Ht 5' 4.5" (1.638 m)   Wt 150 lb 3.2 oz (68.1 kg)   SpO2 99%   BMI 25.38 kg/m   57 y.o.female presents for bilateral ear pain x 4 days and post nasal drip. Ears feel congested. Denies fever, body aches, coughing and sore throat.    BP well controlled with Metoprolol 25 mg every day and Propranolol 10 mg up to 4 times a day as needed for tacchycardia BP Readings from Last 3 Encounters:  11/14/22 100/68  09/29/22 114/72  08/05/22 126/72  Denies headaches, chest pain, shortness of breath and dizziness   Past Medical History:  Diagnosis Date   Allergy    Anemia    Arthritis    in hands   DDD (degenerative disc disease)    Headache    Hyperlipidemia    diet controlled   Hypertension    Neuropathy    PONV (postoperative nausea and vomiting)    Unspecified  vitamin D deficiency      Allergies  Allergen Reactions   Erythromycin Hives   Misc. Sulfonamide Containing Compounds    Penicillins Hives   Prednisone Other (See Comments)    pain   Tetracyclines & Related Hives   Lexapro [Escitalopram] Anxiety    Jittery, headache    Current Outpatient Medications on File Prior to Visit  Medication Sig   alendronate (FOSAMAX) 70 MG tablet TAKE 1 TABLET (70 MG TOTAL) BY MOUTH EVERY 7 DAYS WITH FULL GLASS WATER ON EMPTY STOMACH   busPIRone (BUSPAR) 5 MG tablet Take 1 tablet (5 mg total) by mouth 3 (three) times daily. (Patient taking differently: Take 5 mg by mouth 3 (three) times daily as needed.)   Cetirizine HCl (ZYRTEC PO) Take 1 tablet by mouth daily as needed. Allertec brand   Cholecalciferol (VITAMIN D) 2000 UNITS CAPS Take 12,000 Units by mouth daily.   Cyanocobalamin 2500 MCG TABS Take by mouth.   HYDROcodone-acetaminophen (NORCO/VICODIN) 5-325 MG tablet Take 1 tablet by mouth every 6 (six) hours as needed for moderate pain.   meclizine (ANTIVERT) 12.5 MG tablet Take 1-2 tablets by mouth 3 (three) times daily as needed for dizziness   metoprolol succinate (TOPROL-XL) 25 MG 24 hr tablet Take 1 tablet (25 mg total) by mouth daily.   phentermine (ADIPEX-P) 37.5 MG tablet Take 1/2 to 1 tablet every morning for  dieting & weightloss   propranolol (INDERAL) 10 MG tablet Take 1 tablet (10 mg total) by mouth 4 (four) times daily as needed (tachycardia).   rosuvastatin (CRESTOR) 5 MG tablet Take 1 tablet (5 mg total) by mouth daily.   No current facility-administered medications on file prior to visit.    ROS: all negative except above.   Physical Exam:  BP 100/68   Pulse (!) 54   Temp 97.8 F (36.6 C)   Ht 5' 4.5" (1.638 m)   Wt 150 lb 3.2 oz (68.1 kg)   SpO2 99%   BMI 25.38 kg/m   General Appearance: Well nourished, in no apparent distress. Eyes: PERRLA, EOMs, conjunctiva no swelling or erythema Sinuses: maxillary  tenderness ENT/Mouth: Ext aud canals clear, TMs bulging, fluid, mild erythema. No erythema, swelling, or exudate on post pharynx.  Tonsils not swollen or erythematous. Hearing normal.  Neck: Supple, thyroid normal.  Respiratory: Respiratory effort normal, BS equal bilaterally without rales, rhonchi, wheezing or stridor.  Cardio: RRR with no MRGs. Brisk peripheral pulses without edema.  Abdomen: Soft, + BS.  Non tender, no guarding, rebound, hernias, masses. Lymphatics: Non tender without lymphadenopathy.  Musculoskeletal: Full ROM, 5/5 strength, normal gait.  Skin: Warm, dry without rashes, lesions, ecchymosis.  Neuro: Cranial nerves intact. Normal muscle tone, no cerebellar symptoms. Sensation intact.  Psych: Awake and oriented X 3, normal affect, Insight and Judgment appropriate.     [name] Dick, NP 11:16 AM [name]

## 2022-11-14 ENCOUNTER — Encounter: Payer: Self-pay | Admitting: Nurse Practitioner

## 2022-11-14 ENCOUNTER — Ambulatory Visit: Payer: 59 | Admitting: Nurse Practitioner

## 2022-11-14 VITALS — BP 100/68 | HR 54 | Temp 97.8°F | Ht 64.5 in | Wt 150.2 lb

## 2022-11-14 DIAGNOSIS — I10 Essential (primary) hypertension: Secondary | ICD-10-CM | POA: Diagnosis not present

## 2022-11-14 DIAGNOSIS — J0100 Acute maxillary sinusitis, unspecified: Secondary | ICD-10-CM

## 2022-11-14 MED ORDER — AZITHROMYCIN 250 MG PO TABS: ORAL_TABLET | ORAL | 1 refills | Status: DC

## 2022-11-14 NOTE — Patient Instructions (Signed)
Mucinex twice a day Zithromax as directed  Start generic zyrtec daily  Sinus Infection, Adult A sinus infection, also called sinusitis, is inflammation of your sinuses. Sinuses are hollow spaces in the bones around your face. Your sinuses are located: Around your eyes. In the middle of your forehead. Behind your nose. In your cheekbones. Mucus normally drains out of your sinuses. When your nasal tissues become inflamed or swollen, mucus can become trapped or blocked. This allows bacteria, viruses, and fungi to grow, which leads to infection. Most infections of the sinuses are caused by a virus. A sinus infection can develop quickly. It can last for up to 4 weeks (acute) or for more than 12 weeks (chronic). A sinus infection often develops after a cold. What are the causes? This condition is caused by anything that creates swelling in the sinuses or stops mucus from draining. This includes: Allergies. Asthma. Infection from bacteria or viruses. Deformities or blockages in your nose or sinuses. Abnormal growths in the nose (nasal polyps). Pollutants, such as chemicals or irritants in the air. Infection from fungi. This is rare. What increases the risk? You are more likely to develop this condition if you: Have a weak body defense system (immune system). Do a lot of swimming or diving. Overuse nasal sprays. Smoke. What are the signs or symptoms? The main symptoms of this condition are pain and a feeling of pressure around the affected sinuses. Other symptoms include: Stuffy nose or congestion that makes it difficult to breathe through your nose. Thick yellow or greenish drainage from your nose. Tenderness, swelling, and warmth over the affected sinuses. A cough that may get worse at night. Decreased sense of smell and taste. Extra mucus that collects in the throat or the back of the nose (postnasal drip) causing a sore throat or bad breath. Tiredness (fatigue). Fever. How is this  diagnosed? This condition is diagnosed based on: Your symptoms. Your medical history. A physical exam. Tests to find out if your condition is acute or chronic. This may include: Checking your nose for nasal polyps. Viewing your sinuses using a device that has a light (endoscope). Testing for allergies or bacteria. Imaging tests, such as an MRI or CT scan. In rare cases, a bone biopsy may be done to rule out more serious types of fungal sinus disease. How is this treated? Treatment for a sinus infection depends on the cause and whether your condition is chronic or acute. If caused by a virus, your symptoms should go away on their own within 10 days. You may be given medicines to relieve symptoms. They include: Medicines that shrink swollen nasal passages (decongestants). A spray that eases inflammation of the nostrils (topical intranasal corticosteroids). Rinses that help get rid of thick mucus in your nose (nasal saline washes). Medicines that treat allergies (antihistamines). Over-the-counter pain relievers. If caused by bacteria, your health care provider may recommend waiting to see if your symptoms improve. Most bacterial infections will get better without antibiotic medicine. You may be given antibiotics if you have: A severe infection. A weak immune system. If caused by narrow nasal passages or nasal polyps, surgery may be needed. Follow these instructions at home: Medicines Take, use, or apply over-the-counter and prescription medicines only as told by your health care provider. These may include nasal sprays. If you were prescribed an antibiotic medicine, take it as told by your health care provider. Do not stop taking the antibiotic even if you start to feel better. Hydrate and humidify  Drink enough fluid to keep your urine pale yellow. Staying hydrated will help to thin your mucus. Use a cool mist humidifier to keep the humidity level in your home above 50%. Inhale steam for  10-15 minutes, 3-4 times a day, or as told by your health care provider. You can do this in the bathroom while a hot shower is running. Limit your exposure to cool or dry air. Rest Rest as much as possible. Sleep with your head raised (elevated). Make sure you get enough sleep each night. General instructions  Apply a warm, moist washcloth to your face 3-4 times a day or as told by your health care provider. This will help with discomfort. Use nasal saline washes as often as told by your health care provider. Wash your hands often with soap and water to reduce your exposure to germs. If soap and water are not available, use hand sanitizer. Do not smoke. Avoid being around people who are smoking (secondhand smoke). Keep all follow-up visits. This is important. Contact a health care provider if: You have a fever. Your symptoms get worse. Your symptoms do not improve within 10 days. Get help right away if: You have a severe headache. You have persistent vomiting. You have severe pain or swelling around your face or eyes. You have vision problems. You develop confusion. Your neck is stiff. You have trouble breathing. These symptoms may be an emergency. Get help right away. Call 911. Do not wait to see if the symptoms will go away. Do not drive yourself to the hospital. Summary A sinus infection is soreness and inflammation of your sinuses. Sinuses are hollow spaces in the bones around your face. This condition is caused by nasal tissues that become inflamed or swollen. The swelling traps or blocks the flow of mucus. This allows bacteria, viruses, and fungi to grow, which leads to infection. If you were prescribed an antibiotic medicine, take it as told by your health care provider. Do not stop taking the antibiotic even if you start to feel better. Keep all follow-up visits. This is important. This information is not intended to replace advice given to you by your health care provider.  Make sure you discuss any questions you have with your health care provider. Document Revised: 02/05/2021 Document Reviewed: 02/05/2021 Elsevier Patient Education  2024 [employer].

## 2022-12-29 NOTE — Progress Notes (Unsigned)
Not seen

## 2022-12-30 ENCOUNTER — Ambulatory Visit: Payer: 59 | Admitting: Nurse Practitioner

## 2022-12-30 ENCOUNTER — Encounter: Payer: Self-pay | Admitting: Nurse Practitioner

## 2022-12-30 VITALS — BP 118/72 | HR 66 | Temp 97.5°F | Ht 64.5 in | Wt 151.6 lb

## 2022-12-30 DIAGNOSIS — R7309 Other abnormal glucose: Secondary | ICD-10-CM

## 2022-12-30 DIAGNOSIS — Z79899 Other long term (current) drug therapy: Secondary | ICD-10-CM

## 2022-12-30 DIAGNOSIS — E785 Hyperlipidemia, unspecified: Secondary | ICD-10-CM

## 2022-12-30 DIAGNOSIS — E663 Overweight: Secondary | ICD-10-CM

## 2022-12-30 DIAGNOSIS — I10 Essential (primary) hypertension: Secondary | ICD-10-CM

## 2022-12-30 DIAGNOSIS — E559 Vitamin D deficiency, unspecified: Secondary | ICD-10-CM

## 2022-12-30 DIAGNOSIS — F3341 Major depressive disorder, recurrent, in partial remission: Secondary | ICD-10-CM

## 2022-12-30 DIAGNOSIS — F411 Generalized anxiety disorder: Secondary | ICD-10-CM

## 2023-01-06 NOTE — Progress Notes (Unsigned)
FOLLOW UP 6 MONTH  Assessment and Plan:  Essential hypertension Continue current medications:Metoprolol SR 25mg , half tablet daily. Monitor blood pressure at home; call if consistently over 130/80 Continue DASH diet.   Reminder to go to the ER if any CP, SOB, nausea, dizziness, severe HA, changes vision/speech, left arm numbness and tingling and jaw pain. - CBC - CMP  Overweight Long discussion about weight loss, diet, and exercise Recommended diet heavy in fruits and veggies and low in animal meats, cheeses, and dairy products, appropriate calorie intake Patient will work on decreasing saturated fats and simple carbs  Increase lean proteins and activity as tolerated Continue Phentermine 37.5 mg 1 tab daily Follow up at next visit - TSH  Hyperlipidemia, unspecified hyperlipidemia type Discussed dietary and exercise modifications Low fat diet - Lipid panel - TSH   Vitamin D deficiency Continue supplementation to maintain goal of 70-100 Taking Vitamin D 6,000 IU daily - Vit D  Multinodular thyroid Was to have U/S yearly x 5 years- last U/S 11/29/20 - Thyroid U/S ordered and pt will call to schedule  Abnormal glucose Discussed dietary and exercise modifications - CMP  Recurrent major depressive disorder, in partial remission (HCC) Generalized anxiety disorder Continue Buspar 5 mg TID as needed Restart Effexor XR 37.5 mg every day  Discussed stress management techniques  Discussed, increase water,intake & good sleep hygiene  Discussed increasing exercise & vegetables in diet  Migraine with aura and without status migrainosus, not intractable-      Currently controlled without medication  Medication Management Medications reviewed and Questions answered    Non-recurrent acute allergic otitis media of left ear Use Mucinex DM and push fluids -     azithromycin (ZITHROMAX) 250 MG tablet; Take 2 tablets (500 mg) on  Day 1,  followed by 1 tablet (250 mg) once daily on  Days 2 through 5.  Acute cough Complete Zithromax - use Mucinex DM during the day And promethazine DM at night -     promethazine-dextromethorphan (PROMETHAZINE-DM) 6.25-15 MG/5ML syrup; Take 5 mLs by mouth 4 (four) times daily as needed for cough.      Discussed med's effects and SE's. Screening labs and tests as requested with regular follow-up as recommended.  Over 30 minutes of face to face interview, exam, counseling, chart review, and complex, high level critical decision making was performed this visit.  Future Appointments  Date Time Provider Department Center  01/27/2023  2:00 PM Nahser, Deloris Ping, MD [facility]  03/09/2023  2:00 PM Raynelle Dick, NP [facility] None    HPI 57 y.o. female  presents for three month follow up on HTN, HLD, Overweight , palpitations.   She started a cough on Saturday and was coughing up yellow mucus. Denies sore throat, headaches, body aches, fever, nausea, vomiting, diarrhea.  04/2021 she turned her head and felt something pop in her neck and developed numbness and tingling down right arm.  C2 had slipped out of fusion . Dr. [name] performed reconstruction of cervical fusion .  Pain in neck and numbness in arms has improved.  Will still get occasional pain when sitting more. She is on hydrocodone /acetaminophen 5/325 mg  1 tab every six hours for pain through the pain clinic  Migraines are essentially nonexistent. She has tried sumatriptan and did not tolerate related to increased palpitations.  History of thyroid nodules.  Last thyroid U/S was 11/29/20: 1. Multinodular thyroid gland. 2. Nodule labeled 1 in the right thyroid lobe has slowly increased  in size from 1.0 cm in 2019 to 1.5 cm on today's exam. Recommend continued attention on follow-up ultrasound in 1 year. 3. Nodule labeled 2 in the left thyroid lobe has remained stable in size since 2019. Recommend 1 year follow-up ultrasound until a total follow-up interval of 5  years.   Depression is more problematic currently.  She does have more stress at home. She does feel like she needs to restart medication. She will use buspirone infrequently and does work. Her joy has lessened. She does enjoy her kids and grandkids.   Her blood pressure has been controlled at home, currently on Metoprolol 25 mg every day.  Will take Propranolol 10 mg as needed for palpitations up to q 6 hours has not had to use at all. Today their BP is BP: 108/70   BP Readings from Last 3 Encounters:  01/07/23 108/70  12/30/22 118/72  11/14/22 100/68  She does not workout due to chronic pain of back/neck.  She denies chest pain, shortness of breath, dizziness.  She goes to pain management.   BMI is Body mass index is 25.69 kg/m., she is working on diet and exercise. She is not currently taking Phentermine- last filled 09/29/22 #30- feels she can do well without medication.  Wt Readings from Last 3 Encounters:  01/07/23 152 lb (68.9 kg)  12/30/22 151 lb 9.6 oz (68.8 kg)  11/14/22 150 lb 3.2 oz (68.1 kg)   She follows with Dr. Melburn Popper for palpitations follow yearly with last appointment 08/05/22 She is on cholesterol medication, rosuvastatin 5 mg and denies myalgias. Her cholesterol is at goal. She is limiting dairy, eggs, red meat and fried foods. The cholesterol last visit was:   Lab Results  Component Value Date   CHOL 124 09/29/2022   HDL 44 (L) 09/29/2022   LDLCALC 66 09/29/2022   TRIG 68 09/29/2022   CHOLHDL 2.8 09/29/2022    Last A1C in the office was:  Lab Results  Component Value Date   HGBA1C 5.5 03/05/2022   Patient is on Vitamin D supplement, she is on 6000 IU daily.   Lab Results  Component Value Date   VD25OH 48 09/29/2022     Following with Dr. Jennette Kettle, GYN, she is off the estrogen patch.  She previously used Fosamax for Osteoporosis but has not been taking as it gets stuck in her throat.   Current Medications:  Current Outpatient Medications on File Prior to  Visit  Medication Sig   busPIRone (BUSPAR) 5 MG tablet Take 1 tablet (5 mg total) by mouth 3 (three) times daily. (Patient taking differently: Take 5 mg by mouth 3 (three) times daily as needed.)   CELEBREX 200 MG capsule take 1 capsule by oral route  every day as needed (90 day supply)   Cetirizine HCl (ZYRTEC PO) Take 1 tablet by mouth daily as needed. Allertec brand   Cholecalciferol (VITAMIN D) 2000 UNITS CAPS Take 12,000 Units by mouth daily.   Cyanocobalamin 2500 MCG TABS Take by mouth.   HYDROcodone-acetaminophen (NORCO/VICODIN) 5-325 MG tablet Take 1 tablet by mouth every 6 (six) hours as needed for moderate pain.   meclizine (ANTIVERT) 12.5 MG tablet Take 1-2 tablets by mouth 3 (three) times daily as needed for dizziness   metoprolol succinate (TOPROL-XL) 25 MG 24 hr tablet Take 1 tablet (25 mg total) by mouth daily.   propranolol (INDERAL) 10 MG tablet Take 1 tablet (10 mg total) by mouth 4 (four) times daily as needed (tachycardia).  rosuvastatin (CRESTOR) 5 MG tablet Take 1 tablet (5 mg total) by mouth daily.   alendronate (FOSAMAX) 70 MG tablet TAKE 1 TABLET (70 MG TOTAL) BY MOUTH EVERY 7 DAYS WITH FULL GLASS WATER ON EMPTY STOMACH (Patient not taking: Reported on 12/30/2022)   phentermine (ADIPEX-P) 37.5 MG tablet Take 1/2 to 1 tablet every morning for dieting & weightloss (Patient not taking: Reported on 12/30/2022)   No current facility-administered medications on file prior to visit.    : Patient Care Team: Lucky Cowboy, MD as PCP - General (Internal Medicine) Nahser, Deloris Ping, MD as PCP - Cardiology (Cardiology) Manning Charity, OD as Referring Physician (Optometry) Freddy Finner, MD (Inactive) as Consulting Physician (Obstetrics and Gynecology) Janalyn Harder, MD (Inactive) as Consulting Physician (Dermatology) Charna Elizabeth, MD as Consulting Physician (Gastroenterology)  Medical History:  Past Medical History:  Diagnosis Date   Allergy    Anemia    Arthritis     in hands   DDD (degenerative disc disease)    Headache    Hyperlipidemia    diet controlled   Hypertension    Neuropathy    PONV (postoperative nausea and vomiting)    Unspecified vitamin D deficiency    Allergies Allergies  Allergen Reactions   Erythromycin Hives   Misc. Sulfonamide Containing Compounds    Penicillins Hives   Prednisone Other (See Comments)    pain   Tetracyclines & Related Hives   Lexapro [Escitalopram] Anxiety    Jittery, headache    SURGICAL HISTORY She  has a past surgical history that includes Diagnostic laparoscopy; Abdominal hysterectomy; Tonsillectomy; Cervical disc surgery; Anterior cervical decomp/discectomy fusion (03/29/2012); and Cesarean section. FAMILY HISTORY Her family history includes Atrial fibrillation in her mother; Cancer in her father and maternal grandfather; Diabetes in her father and maternal aunt; Heart disease in her father; Hyperlipidemia in her father and mother; Hypertension in her father and mother. SOCIAL HISTORY She  reports that she quit smoking about 11 years ago. Her smoking use included cigarettes. She started smoking about 14 years ago. She has never used smokeless tobacco. She reports that she does not drink alcohol and does not use drugs.  Review of Systems  Constitutional:  Negative for chills and fever.  HENT:  Positive for congestion. Negative for hearing loss, sinus pain, sore throat and tinnitus.   Eyes:  Negative for blurred vision and double vision.  Respiratory:  Positive for cough and sputum production (yellow). Negative for hemoptysis, shortness of breath and wheezing.   Cardiovascular:  Negative for chest pain, palpitations and leg swelling.  Gastrointestinal:  Negative for abdominal pain, constipation, diarrhea, heartburn, nausea and vomiting.  Genitourinary:  Negative for dysuria and urgency.  Musculoskeletal:  Positive for neck pain. Negative for back pain, falls, joint pain and myalgias.  Skin:  Negative  for rash.  Neurological:  Negative for dizziness, tingling, tremors, weakness and headaches.  Endo/Heme/Allergies:  Does not bruise/bleed easily.  Psychiatric/Behavioral:  Positive for depression. Negative for suicidal ideas. The patient is nervous/anxious. The patient does not have insomnia.     Physical Exam: Estimated body mass index is 25.69 kg/m as calculated from the following:   Height as of this encounter: 5' 4.5" (1.638 m).   Weight as of this encounter: 152 lb (68.9 kg). BP 108/70   Pulse 77   Temp (!) 97.5 F (36.4 C)   Ht 5' 4.5" (1.638 m)   Wt 152 lb (68.9 kg)   SpO2 96%   BMI 25.69 kg/m  General Appearance: Well nourished, in no apparent distress. Eyes: PERRLA, EOMs, conjunctiva no swelling or erythema Sinuses: No Frontal/maxillary tenderness ENT/Mouth: Ext aud canals clear, L TMs without erythema, bulging.  R TM erythema and retraction. Good dentition. No erythema, swelling, or exudate on post pharynx.Hearing normal. Neck: Supple, thyroid  Respiratory: Respiratory effort normal, BS equal bilaterally without rales, rhonchi, wheezing or stridor. Cardio: RRR without murmurs, rubs or gallops. Brisk peripheral pulses without edema.  Musculoskeletal: Full ROM all peripheral extremities,5/5 strength, and normal gait. Limited ROM of neck Skin: Warm, dry without rashes, lesions, ecchymosis.  Neuro: Cranial nerves intact, reflexes equal bilaterally. Normal muscle tone, no cerebellar symptoms. Sensation intact.  Psych: Awake and oriented X 3, normal affect, Insight and Judgment appropriate.     [facility]  01/07/2023

## 2023-01-07 ENCOUNTER — Ambulatory Visit (INDEPENDENT_AMBULATORY_CARE_PROVIDER_SITE_OTHER): Payer: 59 | Admitting: Nurse Practitioner

## 2023-01-07 ENCOUNTER — Encounter: Payer: Self-pay | Admitting: Nurse Practitioner

## 2023-01-07 VITALS — BP 108/70 | HR 77 | Temp 97.5°F | Ht 64.5 in | Wt 152.0 lb

## 2023-01-07 DIAGNOSIS — F411 Generalized anxiety disorder: Secondary | ICD-10-CM

## 2023-01-07 DIAGNOSIS — I10 Essential (primary) hypertension: Secondary | ICD-10-CM | POA: Diagnosis not present

## 2023-01-07 DIAGNOSIS — G43109 Migraine with aura, not intractable, without status migrainosus: Secondary | ICD-10-CM

## 2023-01-07 DIAGNOSIS — R7309 Other abnormal glucose: Secondary | ICD-10-CM | POA: Diagnosis not present

## 2023-01-07 DIAGNOSIS — M810 Age-related osteoporosis without current pathological fracture: Secondary | ICD-10-CM

## 2023-01-07 DIAGNOSIS — E785 Hyperlipidemia, unspecified: Secondary | ICD-10-CM

## 2023-01-07 DIAGNOSIS — Z79899 Other long term (current) drug therapy: Secondary | ICD-10-CM

## 2023-01-07 DIAGNOSIS — E559 Vitamin D deficiency, unspecified: Secondary | ICD-10-CM

## 2023-01-07 DIAGNOSIS — E663 Overweight: Secondary | ICD-10-CM | POA: Diagnosis not present

## 2023-01-07 DIAGNOSIS — E042 Nontoxic multinodular goiter: Secondary | ICD-10-CM

## 2023-01-07 DIAGNOSIS — H65112 Acute and subacute allergic otitis media (mucoid) (sanguinous) (serous), left ear: Secondary | ICD-10-CM

## 2023-01-07 DIAGNOSIS — F3341 Major depressive disorder, recurrent, in partial remission: Secondary | ICD-10-CM

## 2023-01-07 DIAGNOSIS — R051 Acute cough: Secondary | ICD-10-CM

## 2023-01-07 MED ORDER — AZITHROMYCIN 250 MG PO TABS: ORAL_TABLET | ORAL | 1 refills | Status: DC

## 2023-01-07 MED ORDER — PROMETHAZINE-DM 6.25-15 MG/5ML PO SYRP: 5.0000 mL | ORAL_SOLUTION | Freq: Four times a day (QID) | ORAL | 1 refills | Status: DC | PRN

## 2023-01-07 MED ORDER — VENLAFAXINE HCL ER 37.5 MG PO CP24: 37.5000 mg | ORAL_CAPSULE | Freq: Every day | ORAL | 2 refills | Status: DC

## 2023-01-07 NOTE — Patient Instructions (Signed)
YOU CAN CALL TO MAKE AN ULTRASOUND..  I have put in an order for an ultrasound for you to have You can set them up at your convenience by calling this number [phone] You will likely have the ultrasound at [place]   If you have any issues call our office and we will set this up for you.

## 2023-01-10 ENCOUNTER — Other Ambulatory Visit: Payer: Self-pay | Admitting: Cardiovascular Disease

## 2023-01-26 ENCOUNTER — Encounter: Payer: Self-pay | Admitting: Cardiovascular Disease

## 2023-01-26 NOTE — Progress Notes (Unsigned)
Cardiology Office Note   Date:  01/27/2023   ID:  AVACLAIRE KORN, DOB [birth year], MRN [id number]  PCP:  Lucky Cowboy, MD  Cardiologist:   Kristeen Miss, MD   Chief Complaint  Patient presents with   Hypertension        Mitral Valve Prolapse   Problem list 1. Palpitations 2. Hyperlipidemia 3. Cervical spine surgery - is on Lyrica for neuropathy pain .     [name] Smith  [facility] ) is a 57 y.o. female who presents for further evaluation of her palpitations.  ( Daughter of [name] )   Has noticed some palpitations on occasion.     Intermittent   It's intermittent and not constant. She's going through menopause. These palpitations have been occurring for the past couple of months. There is no associated syncope or presyncope. There is no chest pain or shortness breath.  Does not get regular exercise .  Just got a new puppy .   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   Feb. 13, 2018:  Curran  was seen several months ago for palpitations. She had an echocardiogram on 02/20/2015 which reveals normal left ventricular systolic function. She has grade 2 diastolic dysfunction.  Has been diagnosed with osteoperosis. She had stopped the hormone patch Her doctor wanted her to ask me about hormone replacement therapy  Aug 04, 2016:  Echo showed normal LV systolic function. Grade 2 diastolic dysfunction,  No MVP We Try Toprol-XL 25 mg a day but this did not agree with her. She is now cutting the Toprol in half and is tolerating 12.5 kg today. Is not exercising   Jul 31, 2017: Adasha is seen back today for follow up of her PVCs Still has fatigue ,   Has been found to have some thyroid nodules Has had some weight gain.   Is weaning off her pain meds.  Has lost 7 lbs in the past 2 weeks,   Has really cut her carbs  Is using a recumbant bike on occasion .   Sept. 3, 2020  Glenette is seen to follow up for her PVCs.   Wt today is 150 lbs  We started Toprol XL  last year.  Trying to work out .   Has had some plantar fasciatis. She has a mid systolic click on exam today   Nov. 23, 2021: [name] Smith is seen today for follow up of her PVCs Has occasional palpitations Does not get much exercise,   Taking care of her father [name]) who is getting dementia.  Had covid back in May.  Has recovered .   Jan. 17, 2023 [name] Smith is seen for follow up of her PVCs and mild MVP  Wt is 162 lbs .  Her father , [name] passed away this past year  Has had more DOE Has gained weight .  Does not eat that much .  Is seening a rheumatologist  ANA is +    - following up with rheum  soon  Is not exercising much    Aug 05, 2022 [name] Smith is seen for follow up of her PVCs and MVP  Had an episode of tachycardia  Lasted perhaps a minute .  Was not associated with dizziness, or lightheadedness Has had before before we started the metoprolol  Wt is 154.  Is on phentermine for weight loss  We discussed that this would also make her tachycardic .   Nov. 12, 2024  [name] Smith is seen for follow up ofher PVCs and MVP Has had episodes of tachycardia  Not much exercise  Has been a tough year ( mother passed away a year ago today)   No CP, no dyspnea    Past Medical History:  Diagnosis Date   Allergy    Anemia    Arthritis    in hands   DDD (degenerative disc disease)    Headache    Hyperlipidemia    diet controlled   Hypertension    Neuropathy    PONV (postoperative nausea and vomiting)    Unspecified vitamin D deficiency     Past Surgical History:  Procedure Laterality Date   ABDOMINAL HYSTERECTOMY     ANTERIOR CERVICAL DECOMP/DISCECTOMY FUSION  03/29/2012   Procedure: ANTERIOR CERVICAL DECOMPRESSION/DISCECTOMY FUSION 1 LEVEL;  Surgeon: Temple Pacini, MD;  Location: [facility];  Service: Neurosurgery;  Laterality: Bilateral;  Cervical four-five anterior cervical discectomy with fusion    CERVICAL DISC SURGERY     CESAREAN SECTION     DIAGNOSTIC LAPAROSCOPY      TONSILLECTOMY       Current Outpatient Medications  Medication Sig Dispense Refill   busPIRone (BUSPAR) 5 MG tablet Take 1 tablet (5 mg total) by mouth 3 (three) times daily. (Patient taking differently: Take 5 mg by mouth 3 (three) times daily as needed.) 90 tablet 3   CELEBREX 200 MG capsule take 1 capsule by oral route  every day as needed (90 day supply)     Cetirizine HCl (ZYRTEC PO) Take 1 tablet by mouth daily as needed. Allertec brand     Cholecalciferol (VITAMIN D) 2000 UNITS CAPS Take 12,000 Units by mouth daily.     Cyanocobalamin 2500 MCG TABS Take by mouth.     HYDROcodone-acetaminophen (NORCO/VICODIN) 5-325 MG tablet Take 1 tablet by mouth every 6 (six) hours as needed for moderate pain.     meclizine (ANTIVERT) 12.5 MG tablet Take 1-2 tablets by mouth 3 (three) times daily as needed for dizziness 30 tablet 0   metoprolol succinate (TOPROL-XL) 25 MG 24 hr tablet Take 1 tablet (25 mg total) by mouth daily. 90 tablet 3   promethazine-dextromethorphan (PROMETHAZINE-DM) 6.25-15 MG/5ML syrup Take 5 mLs by mouth 4 (four) times daily as needed for cough. 240 mL 1   propranolol (INDERAL) 10 MG tablet Take 1 tablet (10 mg total) by mouth 4 (four) times daily as needed (tachycardia). 90 tablet 3   rosuvastatin (CRESTOR) 5 MG tablet Take 1 tablet (5 mg total) by mouth daily. 30 tablet 11   venlafaxine XR (EFFEXOR XR) 37.5 MG 24 hr capsule Take 1 capsule (37.5 mg total) by mouth daily. 30 capsule 2   alendronate (FOSAMAX) 70 MG tablet TAKE 1 TABLET (70 MG TOTAL) BY MOUTH EVERY 7 DAYS WITH FULL GLASS WATER ON EMPTY STOMACH (Patient not taking: Reported on 12/30/2022) 4 tablet 11   azithromycin (ZITHROMAX) 250 MG tablet Take 2 tablets (500 mg) on  Day 1,  followed by 1 tablet (250 mg) once daily on Days 2 through 5. 6 each 1   phentermine (ADIPEX-P) 37.5 MG tablet Take 1/2 to 1 tablet every morning for dieting & weightloss (Patient not taking: Reported on 12/30/2022) 30 tablet 0   No current  facility-administered medications for this visit.    Allergies:   Erythromycin, Misc. sulfonamide containing compounds, Penicillins, Prednisone, Tetracyclines & related, and Lexapro [escitalopram]   Social History:  The patient  reports that she quit smoking about  11 years ago. Her smoking use included cigarettes. She started smoking about 14 years ago. She has never used smokeless tobacco. She reports that she does not drink alcohol and does not use drugs.   Family History:  The patient's family history includes Atrial fibrillation in her mother; Cancer in her father and maternal grandfather; Diabetes in her father and maternal aunt; Heart disease in her father; Hyperlipidemia in her father and mother; Hypertension in her father and mother.    ROS: Noted in current history, otherwise review of systems is negative.      Physical Exam: Blood pressure 132/78, pulse 72, height 5\' 4"  (1.626 m), weight 147 lb 12.8 oz (67 kg), SpO2 98%.       GEN:  Well nourished, well developed in no acute distress HEENT: Normal NECK: No JVD; No carotid bruits LYMPHATICS: No lymphadenopathy CARDIAC: RRR , soft mid systolic click at lower left sternal border ,  verysoft murmur  RESPIRATORY:  Clear to auscultation without rales, wheezing or rhonchi  ABDOMEN: Soft, non-tender, non-distended MUSCULOSKELETAL:  No edema; No deformity  SKIN: Warm and dry NEUROLOGIC:  Alert and oriented x 3      EKG:    EKG Interpretation Date/Time:  Tuesday January 27 2023 14:13:09 EST Ventricular Rate:  72 PR Interval:  152 QRS Duration:  76 QT Interval:  378 QTC Calculation: 413 R Axis:   70  Text Interpretation: Normal sinus rhythm Normal ECG When compared with ECG of 04-Aug-2004 21:38, No significant change was found Confirmed by Kristeen Miss ([id number]) on 01/27/2023 2:44:00 PM     Recent Labs: 03/05/2022: Magnesium 2.1; TSH 1.01 09/29/2022: ALT 9; BUN 14; Creat 0.56; Hemoglobin 12.0; Platelets 296;  Potassium 4.3; Sodium 141    Lipid Panel    Component Value Date/Time   CHOL 124 09/29/2022 1214   TRIG 68 09/29/2022 1214   HDL 44 (L) 09/29/2022 1214   CHOLHDL 2.8 09/29/2022 1214   VLDL 14 11/28/2015 1506   LDLCALC 66 09/29/2022 1214      Wt Readings from Last 3 Encounters:  01/27/23 147 lb 12.8 oz (67 kg)  01/07/23 152 lb (68.9 kg)  12/30/22 151 lb 9.6 oz (68.8 kg)      Other studies Reviewed: Additional studies/ records that were reviewed today include: . Review of the above records demonstrates:    ASSESSMENT AND PLAN:  1.  Palpitations:      2.  mitral valve prolapse. :  She has very soft midsystolic click and a very soft systolic murmur.  She had trivial mitral regurgitation on her last echocardiogram in 2017.  She overall is doing very well.  I doubt that she will ever have to have this mitral valve repaired.  On exam she still has very minimal mitral regurgitation.   Will have her follow-up with Dr. Tenny Craw in 1 year.        Current medicines are reviewed at length with the patient today.  The patient does not have concerns regarding medicines.  Labs/ tests ordered today include:   Orders Placed This Encounter  Procedures   EKG 12-Lead   Disposition:   FU with  Dr. Tenny Craw in a year .  Kristeen Miss, MD  01/27/2023 2:44 PM    [facility] [address] [place], [place], [place]  [address] Phone: [phone]; Fax: [phone]

## 2023-01-27 ENCOUNTER — Ambulatory Visit: Payer: 59 | Attending: Cardiovascular Disease | Admitting: Cardiovascular Disease

## 2023-01-27 ENCOUNTER — Encounter: Payer: Self-pay | Admitting: Cardiovascular Disease

## 2023-01-27 VITALS — BP 132/78 | HR 72 | Ht 64.0 in | Wt 147.8 lb

## 2023-01-27 DIAGNOSIS — I493 Ventricular premature depolarization: Secondary | ICD-10-CM

## 2023-01-27 DIAGNOSIS — I341 Nonrheumatic mitral (valve) prolapse: Secondary | ICD-10-CM | POA: Diagnosis not present

## 2023-01-27 NOTE — Patient Instructions (Signed)
Follow-Up: At [facility], you and your health needs are our priority.  As part of our continuing mission to provide you with exceptional heart care, we have created designated Provider Care Teams.  These Care Teams include your primary Cardiologist (physician) and Advanced Practice Providers (APPs -  Physician Assistants and Nurse Practitioners) who all work together to provide you with the care you need, when you need it.  We recommend signing up for the patient portal called "MyChart".  Sign up information is provided on this After Visit Summary.  MyChart is used to connect with patients for Virtual Visits (Telemedicine).  Patients are able to view lab/test results, encounter notes, upcoming appointments, etc.  Non-urgent messages can be sent to your provider as well.   To learn more about what you can do with MyChart, go to [email or web address].    Your next appointment:   1 year(s)  Provider:   Dietrich Pates, MD

## 2023-01-29 ENCOUNTER — Ambulatory Visit
Admission: RE | Admit: 2023-01-29 | Discharge: 2023-01-29 | Disposition: A | Discharge status: Home / Self Care | Payer: 59 | Source: Ambulatory Visit | Attending: Nurse Practitioner | Admitting: Nurse Practitioner

## 2023-01-29 DIAGNOSIS — E042 Nontoxic multinodular goiter: Secondary | ICD-10-CM

## 2023-02-23 ENCOUNTER — Encounter: Payer: 59 | Admitting: Nurse Practitioner

## 2023-03-05 NOTE — Progress Notes (Signed)
COMPLETE PHYSICAL  Assessment and Plan:   [name] Smith was seen today for annual exam.  Diagnoses and all orders for this visit:  Encounter for general adult medical examination with abnormal findings Yearly  Essential hypertension Continue current medications:Metoprolol SR 25mg  every day  Monitor blood pressure at home; call if consistently over 130/80 Continue DASH diet.   Reminder to go to the ER if any CP, SOB, nausea, dizziness, severe HA, changes vision/speech, left arm numbness and tingling and jaw pain. -     CBC with Differential/Platelet -     COMPLETE METABOLIC PANEL WITH GFR -     TSH -     Magnesium -     EKG 12-Lead  Hyperlipidemia, unspecified hyperlipidemia type Discussed dietary and exercise modifications Continue Rosuvastatin 5 mg every day  Low fat diet -     Lipid panel  Vitamin D deficiency Continue supplementation to maintain goal of 70-100 Taking Vitamin D 6,000 IU daily -     VITAMIN D 25 Hydroxy (Vit-D Deficiency, Fractures)  Abnormal glucose Discussed dietary and exercise modifications -     Hemoglobin A1c  Recurrent major depressive disorder, in partial remission (HCC) Generalized anxiety disorder Discussed stress management techniques  Discussed, increase water,intake & good sleep hygiene  Discussed increasing exercise & vegetables in diet   Screening for hematuria or proteinuria Routine UA with reflex microscopic Microalbumin/creatinine urine ratio  Screening for AAA U/S ABD Retroperitoneal LTD   Osteopenia, unspecified location -She is on Fosamax weekly - Continue Vit D supplementation and weight bearing exercises as tolerated DEXA UTD  DDD (degenerative disc disease), cervical/ Lumbar pain Continue to follow with pain management Continue medications and activity as tolerated  Migraine with aura and without status migrainosus, not intractable-      Currently improved  Overweight Long discussion about weight loss, diet, and  exercise Recommended diet heavy in fruits and veggies and low in animal meats, cheeses, and dairy products, appropriate calorie intake Patient will work on increasing activity/exercise, increase protein and limit saturated fats and simple carbs Follow up at next visit  Medication management -     CBC with Differential/Platelet -     COMPLETE METABOLIC PANEL WITH GFR -     Magnesium -     Lipid panel -     TSH -     Hemoglobin A1C w/out eAG -     VITAMIN D 25 Hydroxy (Vit-D Deficiency, Fractures) -     [place], RETROPERITNL ABD,  LTD -     Urinalysis, Routine w reflex microscopic -     Microalbumin / creatinine urine ratio  Multinodular thyroid Continue to follow with U/S- next due 01/2024 -     TSH  Screening for hematuria or proteinuria -     Urinalysis, Routine w reflex microscopic -     Microalbumin / creatinine urine ratio  Screening for thyroid disorder -     TSH  Screening for AAA (abdominal aortic aneurysm) -     [place], RETROPERITNL ABD,  LTD  Flu vaccine need -     Fluzone Trivalent Flu Vaccine ([place]uli dose preparattion)  Noise-induced hearing loss, unspecified laterality -     Ambulatory referral to ENT       Discussed med's effects and SE's. Screening labs and tests as requested with regular follow-up as recommended. Over 45 minutes of face to face interview, exam, counseling, chart review, and complex, high level critical decision making was performed this visit.  Future Appointments  Date  Time Provider Department Center  03/21/2024  2:00 PM Raynelle Dick, NP [facility] None    HPI 57 y.o. female  presents for a complete physical. has Hyperlipidemia; Anemia; Vitamin D deficiency; DDD (degenerative disc disease), cervical; Depression; Generalized anxiety disorder; Osteopenia; Essential hypertension; Insomnia secondary to chronic pain; Spinal stenosis in cervical region; PVC's (premature ventricular contractions); Mitral valve prolapse; Palpitations; Recurrent major  depressive disorder, in partial remission (HCC); Migraine with aura and without status migrainosus, not intractable; and Thyroid nodule on their problem list.   She has increased back pain-lumbar.She follows with pain management and is on hydrocodone for that.  She is on xanax AS needed, very low dose 0.25mg , last refill was 12/25/22 for 90 and she states she still has some. She is on Celebrex 200 mg 1 cap daily    She has tried sumatriptan and did not tolerate related to increased palpitations. Palpitations controlled with Metoprolol. Migraines are essentially gone  CT head 2012 MRI brain 2015 Sleep study 2017- negative   Her blood pressure has been controlled at home on Metoprolol 25 mg every day , today their BP is BP: 102/66  BP Readings from Last 3 Encounters:  03/09/23 102/66  01/27/23 132/78  01/07/23 108/70  She does not workout due to chronic pain of back/neck.  She denies chest pain, shortness of breath, dizziness.   DEXA 04/09/21 T -2.9 osteoporosis. Currently on Fosamax once a week.   BMI is Body mass index is 26.53 kg/m., she is not working on diet and exercise currently. .  She continues to have difficulty swallowing pills and meat.  Wt Readings from Last 3 Encounters:  03/09/23 157 lb (71.2 kg)  01/27/23 147 lb 12.8 oz (67 kg)  01/07/23 152 lb (68.9 kg)   She follows with Dr. Melburn Popper for palpitations follow yearly.  Last visit 01/29/23 She is on cholesterol medication, Rosuvastatin 5 mg every day  and denies myalgias. Her cholesterol is at goal. The cholesterol last visit was:   Lab Results  Component Value Date   CHOL 124 09/29/2022   HDL 44 (L) 09/29/2022   LDLCALC 66 09/29/2022   TRIG 68 09/29/2022   CHOLHDL 2.8 09/29/2022    Last A1C in the office was:  Lab Results  Component Value Date   HGBA1C 5.5 03/05/2022   Patient is on Vitamin D supplement, she is on 6000 IU daily.   Lab Results  Component Value Date   VD25OH 48 09/29/2022     Following with Dr.  Jennette Kettle, GYN, she is off the estrogen patch.    Current Medications:  Current Outpatient Medications on File Prior to Visit  Medication Sig   busPIRone (BUSPAR) 5 MG tablet Take 1 tablet (5 mg total) by mouth 3 (three) times daily. (Patient taking differently: Take 5 mg by mouth 3 (three) times daily as needed.)   CELEBREX 200 MG capsule take 1 capsule by oral route  every day as needed (90 day supply)   Cetirizine HCl (ZYRTEC PO) Take 1 tablet by mouth daily as needed. Allertec brand   Cholecalciferol (VITAMIN D) 2000 UNITS CAPS Take 12,000 Units by mouth daily.   Cyanocobalamin 2500 MCG TABS Take by mouth.   HYDROcodone-acetaminophen (NORCO/VICODIN) 5-325 MG tablet Take 1 tablet by mouth every 6 (six) hours as needed for moderate pain.   meclizine (ANTIVERT) 12.5 MG tablet Take 1-2 tablets by mouth 3 (three) times daily as needed for dizziness   metoprolol succinate (TOPROL-XL) 25 MG 24 hr tablet Take  1 tablet (25 mg total) by mouth daily.   propranolol (INDERAL) 10 MG tablet Take 1 tablet (10 mg total) by mouth 4 (four) times daily as needed (tachycardia).   alendronate (FOSAMAX) 70 MG tablet TAKE 1 TABLET (70 MG TOTAL) BY MOUTH EVERY 7 DAYS WITH FULL GLASS WATER ON EMPTY STOMACH (Patient not taking: Reported on 12/30/2022)   azithromycin (ZITHROMAX) 250 MG tablet Take 2 tablets (500 mg) on  Day 1,  followed by 1 tablet (250 mg) once daily on Days 2 through 5. (Patient not taking: Reported on 03/09/2023)   phentermine (ADIPEX-P) 37.5 MG tablet Take 1/2 to 1 tablet every morning for dieting & weightloss (Patient not taking: Reported on 03/09/2023)   promethazine-dextromethorphan (PROMETHAZINE-DM) 6.25-15 MG/5ML syrup Take 5 mLs by mouth 4 (four) times daily as needed for cough. (Patient not taking: Reported on 03/09/2023)   rosuvastatin (CRESTOR) 5 MG tablet Take 1 tablet (5 mg total) by mouth daily.   venlafaxine XR (EFFEXOR XR) 37.5 MG 24 hr capsule Take 1 capsule (37.5 mg total) by mouth  daily. (Patient not taking: Reported on 03/09/2023)   No current facility-administered medications on file prior to visit.    Health Maintenance:   Immunization History  Administered Date(s) Administered   Influenza Inj Mdck Quad With Preservative 01/24/2020   Influenza,inj,Quad PF,6+ Mos 02/19/2021, 03/05/2022   Tdap 10/22/2011   Health Maintenance  Topic Date Due   COVID-19 Vaccine (1) Never done   HIV Screening  Never done   Zoster Vaccines- Shingrix (1 of 2) Never done   Colonoscopy  Never done   MAMMOGRAM  06/16/2014   Cervical Cancer Screening (HPV/Pap Cotest)  06/16/2015   DTaP/Tdap/Td (2 - Td or Tdap) 10/21/2021   Hepatitis C Screening  Completed   HPV VACCINES  Aged Out   INFLUENZA VACCINE  Discontinued     Eye: Dr. Emily Filbert, Due for 2022 Dental Exam complete 2022 : Patient Care Team: Lucky Cowboy, MD as PCP - General (Internal Medicine) Nahser, Deloris Ping, MD as PCP - Cardiology (Cardiology) Manning Charity, OD as Referring Physician (Optometry) Freddy Finner, MD (Inactive) as Consulting Physician (Obstetrics and Gynecology) Janalyn Harder, MD (Inactive) as Consulting Physician (Dermatology) Charna Elizabeth, MD as Consulting Physician (Gastroenterology)  Medical History:  Past Medical History:  Diagnosis Date   Allergy    Anemia    Arthritis    in hands   DDD (degenerative disc disease)    Headache    Hyperlipidemia    diet controlled   Hypertension    Neuropathy    PONV (postoperative nausea and vomiting)    Unspecified vitamin D deficiency    Allergies Allergies  Allergen Reactions   Erythromycin Hives   Misc. Sulfonamide Containing Compounds    Penicillins Hives   Prednisone Other (See Comments)    pain   Tetracyclines & Related Hives   Lexapro [Escitalopram] Anxiety    Jittery, headache    SURGICAL HISTORY She  has a past surgical history that includes Diagnostic laparoscopy; Abdominal hysterectomy; Tonsillectomy; Cervical disc surgery;  Anterior cervical decomp/discectomy fusion (03/29/2012); and Cesarean section. FAMILY HISTORY Her family history includes Atrial fibrillation in her mother; Cancer in her father and maternal grandfather; Diabetes in her father and maternal aunt; Heart disease in her father; Hyperlipidemia in her father and mother; Hypertension in her father and mother. SOCIAL HISTORY She  reports that she quit smoking about 11 years ago. Her smoking use included cigarettes. She started smoking about 14 years ago. She has  never used smokeless tobacco. She reports that she does not drink alcohol and does not use drugs.  Review of Systems  Constitutional:  Negative for chills, fever and malaise/fatigue.  HENT:  Positive for hearing loss. Negative for congestion, sinus pain, sore throat and tinnitus.   Eyes:  Negative for blurred vision and double vision.  Respiratory:  Negative for cough, hemoptysis, sputum production, shortness of breath and wheezing.   Cardiovascular:  Negative for chest pain, palpitations and leg swelling.  Gastrointestinal:  Negative for abdominal pain, constipation, diarrhea, heartburn, nausea and vomiting.  Genitourinary:  Negative for dysuria and urgency.  Musculoskeletal:  Positive for back pain and joint pain. Negative for falls, myalgias and neck pain.  Skin:  Negative for rash.  Neurological:  Negative for dizziness, tingling, tremors, weakness and headaches.  Endo/Heme/Allergies:  Does not bruise/bleed easily.  Psychiatric/Behavioral:  Negative for depression and suicidal ideas. The patient is not nervous/anxious and does not have insomnia.     Physical Exam: Estimated body mass index is 26.53 kg/m as calculated from the following:   Height as of this encounter: 5' 4.5" (1.638 m).   Weight as of this encounter: 157 lb (71.2 kg). BP 102/66   Pulse 70   Temp (!) 97.5 F (36.4 C)   Ht 5' 4.5" (1.638 m)   Wt 157 lb (71.2 kg)   SpO2 98%   BMI 26.53 kg/m  General Appearance: Well  nourished, in no apparent distress. Eyes: PERRLA, EOMs, conjunctiva no swelling or erythema Sinuses: No Frontal/maxillary tenderness ENT/Mouth: Ext aud canals clear, normal light reflex with TMs without erythema, bulging.  Good dentition. No erythema, swelling, or exudate on post pharynx. Tonsils not swollen or erythematous. Hearing normal.  Neck: Supple, thyroid enlarged with nodule palpated on right side No bruits Respiratory: Respiratory effort normal, BS equal bilaterally without rales, rhonchi, wheezing or stridor. Cardio: RRR without murmurs, rubs or gallops. Brisk peripheral pulses without edema.  Chest: symmetric, with normal excursions and percussion. Breasts: defer to GYN Abdomen: Soft, +BS, no guarding, hernias, masses, or organomegaly. .  Lymphatics: Non tender without lymphadenopathy.  Genitourinary: defer to GYN Musculoskeletal: Full ROM all peripheral extremities,5/5 strength, and normal gait. Skin: Warm, dry without rashes, lesions, ecchymosis.  Neuro: Cranial nerves intact, reflexes equal bilaterally. Normal muscle tone, no cerebellar symptoms. Sensation intact.  Psych: Awake and oriented X 3, normal affect, Insight and Judgment appropriate.   WJX:BJYNW to cardiology AAA: < 3 cm  Revonda Humphrey [facility]  03/09/2023

## 2023-03-09 ENCOUNTER — Ambulatory Visit (INDEPENDENT_AMBULATORY_CARE_PROVIDER_SITE_OTHER): Payer: 59 | Admitting: Nurse Practitioner

## 2023-03-09 ENCOUNTER — Encounter: Payer: Self-pay | Admitting: Nurse Practitioner

## 2023-03-09 VITALS — BP 102/66 | HR 70 | Temp 97.5°F | Ht 64.5 in | Wt 157.0 lb

## 2023-03-09 DIAGNOSIS — F411 Generalized anxiety disorder: Secondary | ICD-10-CM

## 2023-03-09 DIAGNOSIS — I700 Atherosclerosis of aorta: Secondary | ICD-10-CM | POA: Diagnosis not present

## 2023-03-09 DIAGNOSIS — E042 Nontoxic multinodular goiter: Secondary | ICD-10-CM

## 2023-03-09 DIAGNOSIS — Z0001 Encounter for general adult medical examination with abnormal findings: Secondary | ICD-10-CM

## 2023-03-09 DIAGNOSIS — M5030 Other cervical disc degeneration, unspecified cervical region: Secondary | ICD-10-CM

## 2023-03-09 DIAGNOSIS — M545 Low back pain, unspecified: Secondary | ICD-10-CM

## 2023-03-09 DIAGNOSIS — R7309 Other abnormal glucose: Secondary | ICD-10-CM

## 2023-03-09 DIAGNOSIS — Z23 Encounter for immunization: Secondary | ICD-10-CM

## 2023-03-09 DIAGNOSIS — Z1389 Encounter for screening for other disorder: Secondary | ICD-10-CM

## 2023-03-09 DIAGNOSIS — Z1329 Encounter for screening for other suspected endocrine disorder: Secondary | ICD-10-CM

## 2023-03-09 DIAGNOSIS — Z0000 Encounter for general adult medical examination without abnormal findings: Secondary | ICD-10-CM

## 2023-03-09 DIAGNOSIS — G43109 Migraine with aura, not intractable, without status migrainosus: Secondary | ICD-10-CM

## 2023-03-09 DIAGNOSIS — I10 Essential (primary) hypertension: Secondary | ICD-10-CM

## 2023-03-09 DIAGNOSIS — Z79899 Other long term (current) drug therapy: Secondary | ICD-10-CM

## 2023-03-09 DIAGNOSIS — E785 Hyperlipidemia, unspecified: Secondary | ICD-10-CM

## 2023-03-09 DIAGNOSIS — F3341 Major depressive disorder, recurrent, in partial remission: Secondary | ICD-10-CM

## 2023-03-09 DIAGNOSIS — M8580 Other specified disorders of bone density and structure, unspecified site: Secondary | ICD-10-CM

## 2023-03-09 DIAGNOSIS — E559 Vitamin D deficiency, unspecified: Secondary | ICD-10-CM

## 2023-03-09 DIAGNOSIS — E663 Overweight: Secondary | ICD-10-CM

## 2023-03-09 DIAGNOSIS — M810 Age-related osteoporosis without current pathological fracture: Secondary | ICD-10-CM

## 2023-03-09 DIAGNOSIS — H833X9 Noise effects on inner ear, unspecified ear: Secondary | ICD-10-CM

## 2023-03-09 DIAGNOSIS — Z136 Encounter for screening for cardiovascular disorders: Secondary | ICD-10-CM

## 2023-03-09 NOTE — Patient Instructions (Signed)

## 2023-03-10 LAB — CBC WITH DIFFERENTIAL/PLATELET
Absolute Lymphocytes: 1976 {cells}/uL (ref 850–3900)
Absolute Monocytes: 481 {cells}/uL (ref 200–950)
Basophils Absolute: 78 {cells}/uL (ref 0–200)
Basophils Relative: 1.2 %
Eosinophils Absolute: 202 {cells}/uL (ref 15–500)
Eosinophils Relative: 3.1 %
HCT: 38.7 % (ref 35.0–45.0)
Hemoglobin: 12.7 g/dL (ref 11.7–15.5)
MCH: 29.6 pg (ref 27.0–33.0)
MCHC: 32.8 g/dL (ref 32.0–36.0)
MCV: 90.2 fL (ref 80.0–100.0)
MPV: 10.3 fL (ref 7.5–12.5)
Monocytes Relative: 7.4 %
Neutro Abs: 3764 {cells}/uL (ref 1500–7800)
Neutrophils Relative %: 57.9 %
Platelets: 343 10*3/uL (ref 140–400)
RBC: 4.29 10*6/uL (ref 3.80–5.10)
RDW: 12.3 % (ref 11.0–15.0)
Total Lymphocyte: 30.4 %
WBC: 6.5 10*3/uL (ref 3.8–10.8)

## 2023-03-10 LAB — COMPLETE METABOLIC PANEL WITH GFR
AG Ratio: 1.6 (calc) (ref 1.0–2.5)
ALT: 12 U/L (ref 6–29)
AST: 13 U/L (ref 10–35)
Albumin: 4.4 g/dL (ref 3.6–5.1)
Alkaline phosphatase (APISO): 65 U/L (ref 37–153)
BUN: 15 mg/dL (ref 7–25)
CO2: 30 mmol/L (ref 20–32)
Calcium: 9.7 mg/dL (ref 8.6–10.4)
Chloride: 104 mmol/L (ref 98–110)
Creat: 0.64 mg/dL (ref 0.50–1.03)
Globulin: 2.7 g/dL (ref 1.9–3.7)
Glucose, Bld: 77 mg/dL (ref 65–99)
Potassium: 4.6 mmol/L (ref 3.5–5.3)
Sodium: 141 mmol/L (ref 135–146)
Total Bilirubin: 0.3 mg/dL (ref 0.2–1.2)
Total Protein: 7.1 g/dL (ref 6.1–8.1)
eGFR: 102 mL/min/{1.73_m2} (ref >=60)

## 2023-03-10 LAB — URINALYSIS, ROUTINE W REFLEX MICROSCOPIC
Bilirubin Urine: NEGATIVE
Glucose, UA: NEGATIVE
Hgb urine dipstick: NEGATIVE
Leukocytes,Ua: NEGATIVE
Nitrite: NEGATIVE
Protein, ur: NEGATIVE
Specific Gravity, Urine: 1.023 (ref 1.001–1.035)
pH: 5.5 (ref 5.0–8.0)

## 2023-03-10 LAB — LIPID PANEL
Cholesterol: 134 mg/dL (ref <200)
HDL: 48 mg/dL — ABNORMAL LOW (ref >=50)
LDL Cholesterol (Calc): 69 mg/dL
Non-HDL Cholesterol (Calc): 86 mg/dL (ref <130)
Total CHOL/HDL Ratio: 2.8 (calc) (ref <5.0)
Triglycerides: 88 mg/dL (ref <150)

## 2023-03-10 LAB — MICROALBUMIN / CREATININE URINE RATIO
Creatinine, Urine: 249 mg/dL (ref 20–275)
Microalb Creat Ratio: 4 mg/g{creat} (ref <30)
Microalb, Ur: 1 mg/dL

## 2023-03-10 LAB — TSH: TSH: 1.06 m[IU]/L (ref 0.40–4.50)

## 2023-03-10 LAB — MAGNESIUM: Magnesium: 2.3 mg/dL (ref 1.5–2.5)

## 2023-03-10 LAB — HEMOGLOBIN A1C W/OUT EAG: Hgb A1c MFr Bld: 5.6 %{Hb} (ref <5.7)

## 2023-03-10 LAB — VITAMIN D 25 HYDROXY (VIT D DEFICIENCY, FRACTURES): Vit D, 25-Hydroxy: 32 ng/mL (ref 30–100)

## 2023-03-14 ENCOUNTER — Other Ambulatory Visit: Payer: Self-pay | Admitting: Nurse Practitioner

## 2023-03-14 DIAGNOSIS — E785 Hyperlipidemia, unspecified: Secondary | ICD-10-CM

## 2023-04-28 ENCOUNTER — Encounter: Payer: Self-pay | Admitting: *Deleted

## 2023-07-09 ENCOUNTER — Encounter: Payer: Self-pay | Admitting: Physician Assistant

## 2023-07-09 ENCOUNTER — Ambulatory Visit: Admitting: Physician Assistant

## 2023-07-09 VITALS — BP 109/69 | HR 62 | Ht 64.5 in | Wt 161.2 lb

## 2023-07-09 DIAGNOSIS — R768 Other specified abnormal immunological findings in serum: Secondary | ICD-10-CM | POA: Diagnosis not present

## 2023-07-09 DIAGNOSIS — E663 Overweight: Secondary | ICD-10-CM

## 2023-07-09 DIAGNOSIS — F4321 Adjustment disorder with depressed mood: Secondary | ICD-10-CM | POA: Diagnosis not present

## 2023-07-09 DIAGNOSIS — M8580 Other specified disorders of bone density and structure, unspecified site: Secondary | ICD-10-CM

## 2023-07-09 DIAGNOSIS — I10 Essential (primary) hypertension: Secondary | ICD-10-CM

## 2023-07-09 NOTE — Progress Notes (Unsigned)
 New patient visit   Patient: [name] Smith   DOB: 1965-07-12   58 y.o. Female  MRN: [id number] Visit Date: 07/09/2023  Today's healthcare provider: Trenton Frock, PA-C   No chief complaint on file.  Subjective    [name] Smith is a 58 y.o. female who presents today as a new patient to establish care.   Osteo -- fosamax ?? --history of neck surgery, difficult to swallow  [id number]   Anxiety--buspar  5 tid  Metop 25 and propranol  ??  Crestor  5   Weight management   Rheum referral-- history of positive ANA  [facility]   Therapy   Past Medical History:  Diagnosis Date   Allergy    Anemia    Arthritis    in hands   DDD (degenerative disc disease)    Headache    Hyperlipidemia    diet controlled   Hypertension    Neuropathy    PONV (postoperative nausea and vomiting)    Unspecified vitamin D  deficiency    Past Surgical History:  Procedure Laterality Date   ABDOMINAL HYSTERECTOMY     ANTERIOR CERVICAL DECOMP/DISCECTOMY FUSION  03/29/2012   Procedure: ANTERIOR CERVICAL DECOMPRESSION/DISCECTOMY FUSION 1 LEVEL;  Surgeon: Baruch Bosch, MD;  Location: [facility];  Service: Neurosurgery;  Laterality: Bilateral;  Cervical four-five anterior cervical discectomy with fusion    CERVICAL DISC SURGERY     CESAREAN SECTION     DIAGNOSTIC LAPAROSCOPY     TONSILLECTOMY     Family Status  Relation Name Status   Mother  Alive       CA, melanoma   Father  Alive       MI, CA (prostate)   Brother  Alive       healthy   Daughter  Alive       healthy   Son  Alive       healthy   Son  Alive       healthy   Mat Aunt  (Not Specified)   MGF  Deceased   MGM  Deceased   PGM  Deceased   PGF  Deceased  No partnership data on file   Family History  Problem Relation Age of Onset   Hypertension Mother    Hyperlipidemia Mother    Atrial fibrillation Mother    Heart disease Father    Hyperlipidemia Father    Cancer Father        prostate    Hypertension Father    Diabetes Father    Diabetes Maternal Aunt    Cancer Maternal Grandfather        colon   Social History   Socioeconomic History   Marital status: Married    Spouse name: Not on file   Number of children: Not on file   Years of education: Not on file   Highest education level: Not on file  Occupational History   Not on file  Tobacco Use   Smoking status: Former    Current packs/day: 0.00    Types: Cigarettes    Start date: 08/16/2008    Quit date: 08/17/2011    Years since quitting: 11.9   Smokeless tobacco: Never  Vaping Use   Vaping status: Never Used  Substance and Sexual Activity   Alcohol use: No   Drug use: No   Sexual activity: Yes    Birth control/protection: Surgical    Comment: 1 cig every 2-3 dyas  Other Topics Concern  Not on file  Social History Narrative   Not on file   Social Drivers of Health   Financial Resource Strain: Not on file  Food Insecurity: Not on file  Transportation Needs: Not on file  Physical Activity: Not on file  Stress: Not on file  Social Connections: Not on file   Outpatient Medications Prior to Visit  Medication Sig   alendronate  (FOSAMAX ) 70 MG tablet TAKE 1 TABLET (70 MG TOTAL) BY MOUTH EVERY 7 DAYS WITH FULL GLASS WATER ON EMPTY STOMACH (Patient not taking: Reported on 12/30/2022)   busPIRone  (BUSPAR ) 5 MG tablet Take 1 tablet (5 mg total) by mouth 3 (three) times daily. (Patient taking differently: Take 5 mg by mouth 3 (three) times daily as needed.)   CELEBREX 200 MG capsule take 1 capsule by oral route  every day as needed (90 day supply)   Cetirizine HCl (ZYRTEC PO) Take 1 tablet by mouth daily as needed. Allertec brand   Cholecalciferol (VITAMIN D ) 2000 UNITS CAPS Take 12,000 Units by mouth daily.   Cyanocobalamin 2500 MCG TABS Take by mouth.   HYDROcodone -acetaminophen  (NORCO/VICODIN) 5-325 MG tablet Take 1 tablet by mouth every 6 (six) hours as needed for moderate pain.   meclizine  (ANTIVERT )  12.5 MG tablet Take 1-2 tablets by mouth 3 (three) times daily as needed for dizziness   metoprolol  succinate (TOPROL -XL) 25 MG 24 hr tablet Take 1 tablet (25 mg total) by mouth daily.   phentermine  (ADIPEX-P ) 37.5 MG tablet Take 1/2 to 1 tablet every morning for dieting & weightloss (Patient not taking: Reported on 03/09/2023)   propranolol  (INDERAL ) 10 MG tablet Take 1 tablet (10 mg total) by mouth 4 (four) times daily as needed (tachycardia).   rosuvastatin  (CRESTOR ) 5 MG tablet TAKE 1 TABLET (5 MG TOTAL) BY MOUTH DAILY.   No facility-administered medications prior to visit.   Allergies  Allergen Reactions   Erythromycin Hives   Misc. Sulfonamide Containing Compounds    Penicillins Hives   Prednisone Other (See Comments)    pain   Tetracyclines & Related Hives   Lexapro  [Escitalopram ] Anxiety    Jittery, headache    Immunization History  Administered Date(s) Administered   Fluzone Influenza virus vaccine,trivalent (IIV3), split virus 03/09/2023   Influenza Inj Mdck Quad With Preservative 01/24/2020   Influenza,inj,Quad PF,6+ Mos 02/19/2021, 03/05/2022   Tdap 10/22/2011    Health Maintenance  Topic Date Due   COVID-19 Vaccine (1) Never done   HIV Screening  Never done   Pneumococcal Vaccine 103-86 Years old (1 of 2 - PCV) Never done   Zoster Vaccines- Shingrix (1 of 2) Never done   Colonoscopy  Never done   MAMMOGRAM  06/16/2014   Cervical Cancer Screening (HPV/Pap Cotest)  06/16/2015   DTaP/Tdap/Td (2 - Td or Tdap) 10/21/2021   Hepatitis C Screening  Completed   HPV VACCINES  Aged Out   Meningococcal B Vaccine  Aged Out   INFLUENZA VACCINE  Discontinued    Patient Care Team: Vangie Genet, MD as PCP - General (Internal Medicine) Nahser, Lela Purple, MD as PCP - Cardiology (Cardiology) Pauline Bos, OD as Referring Physician (Optometry) Leontine Rana, MD (Inactive) as Consulting Physician (Obstetrics and Gynecology) Devon Fogo, MD (Inactive) as Consulting  Physician (Dermatology) Tami Falcon, MD as Consulting Physician (Gastroenterology)  Review of Systems  {Insert previous labs (optional):23779} {See past labs  Heme  Chem  Endocrine  Serology  Results Review (optional):1}   Objective    There were no  vitals taken for this visit. {Insert last BP/Wt (optional):23777}{See vitals history (optional):1}   Physical Exam Constitutional:      General: She is awake.     Appearance: She is well-developed.  HENT:     Head: Normocephalic.  Eyes:     Conjunctiva/sclera: Conjunctivae normal.  Cardiovascular:     Rate and Rhythm: Normal rate and regular rhythm.     Heart sounds: Normal heart sounds.  Pulmonary:     Effort: Pulmonary effort is normal.     Breath sounds: Normal breath sounds.  Skin:    General: Skin is warm.  Neurological:     Mental Status: She is alert and oriented to person, place, and time.  Psychiatric:        Attention and Perception: Attention normal.        Mood and Affect: Mood normal.        Speech: Speech normal.        Behavior: Behavior is cooperative.    ***  Depression Screen    05/15/2015    3:36 PM  PHQ 2/9 Scores  PHQ - 2 Score 1   No results found for any visits on 07/09/23.  Assessment & Plan     There are no diagnoses linked to this encounter.   No follow-ups on file.      Trenton Frock, PA-C  [facility] [phone] (phone) [phone] (fax)  [facility]

## 2023-07-10 ENCOUNTER — Encounter: Payer: Self-pay | Admitting: Physician Assistant

## 2023-07-10 DIAGNOSIS — R768 Other specified abnormal immunological findings in serum: Secondary | ICD-10-CM | POA: Insufficient documentation

## 2023-07-10 DIAGNOSIS — F4321 Adjustment disorder with depressed mood: Secondary | ICD-10-CM | POA: Insufficient documentation

## 2023-07-10 DIAGNOSIS — E663 Overweight: Secondary | ICD-10-CM | POA: Insufficient documentation

## 2023-07-10 NOTE — Assessment & Plan Note (Signed)
 Referred to psychology.  Pt does take buspar  5 mg prn

## 2023-07-10 NOTE — Assessment & Plan Note (Signed)
 Pt was taking fosamax , unable as she cannot swallow the large pill. Due for dexa-- she does this with gyn. Once I review, we will determine need for monitoring vs referral for prolia/reclast

## 2023-07-10 NOTE — Assessment & Plan Note (Signed)
 On chart review, looks like a low positive.  Will obtain rheum records and refer as indicated

## 2023-07-10 NOTE — Assessment & Plan Note (Signed)
 Well controlled.  Pt takes metoprolol  25 mg daily. Reviewed cmp

## 2023-07-10 NOTE — Assessment & Plan Note (Signed)
 Current BMI is 27. Discussed limitations of phentermine  for long-term weight management due to potential cardiovascular risks. Explored other weight loss options, including nutritionist consultation and Weight Watchers. Medications like Wegovy or Zepbound were discussed but are not indicated at current BMI and health status. Weight loss medications are typically considered when BMI is over 30. Pt will try weight watchers and let me know if she would like to see a nutritionist

## 2023-07-13 ENCOUNTER — Encounter: Payer: Self-pay | Admitting: Physician Assistant

## 2023-08-11 ENCOUNTER — Other Ambulatory Visit: Payer: Self-pay | Admitting: Cardiovascular Disease

## 2023-09-08 ENCOUNTER — Ambulatory Visit: Payer: 59 | Admitting: Nurse Practitioner

## 2023-10-30 NOTE — Progress Notes (Unsigned)
 Cardiology Clinic Note   Patient Name: [name] Smith Date of Encounter: 11/02/2023  Primary Care Provider:  Cyndi Shaver, PA-C Primary Cardiologist:  Aleene Passe, MD (Inactive)  Patient Profile    [name] Smith 58 year old female presents to the clinic today for evaluation of her shortness of breath and dizziness.  Past Medical History    Past Medical History:  Diagnosis Date   Allergy    Anemia    Arthritis    in hands   DDD (degenerative disc disease)    Headache    Hyperlipidemia    diet controlled   Hypertension    Neuropathy    PONV (postoperative nausea and vomiting)    Unspecified vitamin D  deficiency    Past Surgical History:  Procedure Laterality Date   ABDOMINAL HYSTERECTOMY     ANTERIOR CERVICAL DECOMP/DISCECTOMY FUSION  03/29/2012   Procedure: ANTERIOR CERVICAL DECOMPRESSION/DISCECTOMY FUSION 1 LEVEL;  Surgeon: Victory DELENA Gunnels, MD;  Location: [facility];  Service: Neurosurgery;  Laterality: Bilateral;  Cervical four-five anterior cervical discectomy with fusion    CERVICAL DISC SURGERY     CESAREAN SECTION     DIAGNOSTIC LAPAROSCOPY     TONSILLECTOMY      Allergies  Allergies  Allergen Reactions   Erythromycin Hives   Misc. Sulfonamide Containing Compounds    Penicillins Hives   Prednisone Other (See Comments)    pain   Tetracyclines & Related Hives   Lexapro  [Escitalopram ] Anxiety    Jittery, headache    History of Present Illness    [name] has a PMH of hypertension, mitral valve prolapse, hyperlipidemia, cervical spine pain, neuropathy, and PVCs/ palpitations.  Echocardiogram 02/20/2016 showed LVEF of 55-60%, G2 DD, trivial mitral valve regurgitation.  She is initially seen and evaluated by Dr. Passe for palpitations.  She was placed on metoprolol .  She did not tolerate 25 mg but did tolerate 12 and half milligrams daily.  She continued to follow-up with Dr. Passe regularly.  And was last seen in the clinic on  01/27/2023.  During that time she was following up for PVCs and MVP.  She reported episodes of tachycardia.  She was not exercising regularly.  She reported it had been a tough year with her mother passing away a year ago on that day.  She denied chest pain and dyspnea.  Her blood pressure was noted to be 132/78.  Her pulse was 72.  Her EKG showed normal sinus rhythm 72 bpm.  She was noted to have a soft midsystolic click with very soft systolic murmur.  It was felt that overall she was stable from a cardiac standpoint.  It was felt that she may not need to have mitral valve repair due to her minimal regurgitation.  Follow-up in 1 year with Dr. Okey was planned.  She presents to the clinic today for follow-up evaluation and states she has been noticing fatigue, increased shortness of breath, congestion, and lightheadedness.  Her congestion has been present for about 1 month.  Her shortness of breath and lightheadedness have been present for around 2 months.  We reviewed her previous cardiac history as well as her family history.  She reports that she was on phentermine  and since being off the medication she has gained close to 20 pounds.  She reports that she is less physically active due to back arthritic issues.  She has been avoiding exercise.  She does have a recumbent bike but I recommended she start using.  She also reports that she did not qualify for healthy weight and wellness due to  her current BMI.  I encouraged her to try intermittent fasting and increase her physical activity.  She also feels increasing fatigue.  I will order an echocardiogram, BMP, CBC, home sleep study and plan follow-up as scheduled with Dr. Okey.  Today she denies chest pain,  lower extremity edema, fatigue, palpitations, melena, hematuria, hemoptysis, diaphoresis, weakness, presyncope, syncope, orthopnea, and PND.     Home Medications    Prior to Admission medications   Medication Sig Start Date End Date Taking?  Authorizing Provider  busPIRone  (BUSPAR ) 5 MG tablet Take 1 tablet (5 mg total) by mouth 3 (three) times daily. Patient taking differently: Take 5 mg by mouth as needed. 01/02/22   Wilkinson, Dana E, NP  CELEBREX 200 MG capsule take 1 capsule by oral route  every day as needed (90 day supply) 12/25/22   [provider]  Cetirizine HCl (ZYRTEC PO) Take 1 tablet by mouth daily as needed. Allertec brand    [provider]  Cholecalciferol (VITAMIN D ) 2000 UNITS CAPS Take 12,000 Units by mouth daily.    [provider]  Cyanocobalamin 2500 MCG TABS Take by mouth.    [provider]  HYDROcodone -acetaminophen  (NORCO/VICODIN) 5-325 MG tablet Take 1 tablet by mouth every 6 (six) hours as needed for moderate pain.    [provider]  meclizine  (ANTIVERT ) 12.5 MG tablet Take 1-2 tablets by mouth 3 (three) times daily as needed for dizziness 12/19/21   Immordino, Garnette, FNP  metoprolol  succinate (TOPROL -XL) 25 MG 24 hr tablet TAKE 1 TABLET (25 MG TOTAL) BY MOUTH DAILY. 08/12/23   Nahser, Aleene PARAS, MD  propranolol  (INDERAL ) 10 MG tablet Take 1 tablet (10 mg total) by mouth 4 (four) times daily as needed (tachycardia). 08/05/22   Nahser, Aleene PARAS, MD  rosuvastatin  (CRESTOR ) 5 MG tablet TAKE 1 TABLET (5 MG TOTAL) BY MOUTH DAILY. 03/15/23 03/14/24  Jude Lonell BRAVO, NP    Family History    Family History  Problem Relation Age of Onset   Hypertension Mother    Hyperlipidemia Mother    Atrial fibrillation Mother    Heart disease Father    Hyperlipidemia Father    Cancer Father        prostate   Hypertension Father    Diabetes Father    Diabetes Maternal Aunt    Cancer Maternal Grandfather        colon   She indicated that her mother is alive. She indicated that her father is alive. She indicated that her brother is alive. She indicated that her maternal grandmother is deceased. She indicated that her maternal grandfather is deceased. She indicated that her  paternal grandmother is deceased. She indicated that her paternal grandfather is deceased. She indicated that her daughter is alive. She indicated that both of her sons are alive. She indicated that the status of her maternal aunt is unknown.  Social History    Social History   Socioeconomic History   Marital status: Married    Spouse name: Not on file   Number of children: Not on file   Years of education: Not on file   Highest education level: Not on file  Occupational History   Not on file  Tobacco Use   Smoking status: Former    Current packs/day: 0.00    Types: Cigarettes    Start date: 08/16/2008    Quit date: 08/17/2011  Years since quitting: 12.2   Smokeless tobacco: Never  Vaping Use   Vaping status: Never Used  Substance and Sexual Activity   Alcohol use: No   Drug use: No   Sexual activity: Yes    Birth control/protection: Surgical    Comment: 1 cig every 2-3 dyas  Other Topics Concern   Not on file  Social History Narrative   Not on file   Social Drivers of Health   Financial Resource Strain: Not on file  Food Insecurity: Not on file  Transportation Needs: Not on file  Physical Activity: Not on file  Stress: Not on file  Social Connections: Not on file  Intimate Partner Violence: Not on file     Review of Systems    General:  No chills, fever, night sweats or weight changes.  Cardiovascular:  No chest pain, dyspnea on exertion, edema, orthopnea, palpitations, paroxysmal nocturnal dyspnea. Dermatological: No rash, lesions/masses Respiratory: No cough, dyspnea Urologic: No hematuria, dysuria Abdominal:   No nausea, vomiting, diarrhea, bright red blood per rectum, melena, or hematemesis Neurologic:  No visual changes, wkns, changes in mental status. All other systems reviewed and are otherwise negative except as noted above.  Physical Exam    VS:  BP 109/74   Pulse 65   Ht 5' 5 (1.651 m)   Wt 163 lb (73.9 kg)   SpO2 95%   BMI 27.12 kg/m  , BMI  Body mass index is 27.12 kg/m. GEN: Well nourished, well developed, in no acute distress. HEENT: normal. Neck: Supple, no JVD, carotid bruits, or masses. Cardiac: RRR, no murmurs, rubs, or gallops. No clubbing, cyanosis, edema.  Radials/DP/PT 2+ and equal bilaterally.  Respiratory:  Respirations regular and unlabored, clear to auscultation bilaterally. GI: Soft, nontender, nondistended, BS + x 4. MS: no deformity or atrophy. Skin: warm and dry, no rash. Neuro:  Strength and sensation are intact. Psych: Normal affect.  Accessory Clinical Findings    Recent Labs: 03/09/2023: ALT 12; BUN 15; Creat 0.64; Hemoglobin 12.7; Magnesium  2.3; Platelets 343; Potassium 4.6; Sodium 141; TSH 1.06   Recent Lipid Panel    Component Value Date/Time   CHOL 134 03/09/2023 1435   TRIG 88 03/09/2023 1435   HDL 48 (L) 03/09/2023 1435   CHOLHDL 2.8 03/09/2023 1435   VLDL 14 11/28/2015 1506   LDLCALC 69 03/09/2023 1435         ECG personally reviewed by me today- EKG Interpretation Date/Time:  Monday November 02 2023 14:38:20 EDT Ventricular Rate:  65 PR Interval:  158 QRS Duration:  80 QT Interval:  398 QTC Calculation: 413 R Axis:   -3  Text Interpretation: Normal sinus rhythm Normal ECG Confirmed by Emelia Hazy [phone]) on 11/02/2023 2:39:58 PM   Echocardiogram 02/20/2016   cc:   -------------------------------------------------------------------  LV EF: 55% -   60%   -------------------------------------------------------------------  Indications:     Mitral Valve DIsorder (I05.9).   -------------------------------------------------------------------  History:  PMH:   Dyspnea and murmur.  Risk factors:  Palpitations.  Family history of coronary artery disease. Former tobacco use.  Dyslipidemia.   -------------------------------------------------------------------  Study Conclusions   - Left ventricle: The cavity size was normal. Wall thickness was    normal. Systolic function  was normal. The estimated ejection    fraction was in the range of 55% to 60%. Wall motion was normal;    there were no regional wall motion abnormalities. Features are    consistent with a pseudonormal left ventricular filling pattern,  with concomitant abnormal relaxation and increased filling    pressure (grade 2 diastolic dysfunction).  - Aortic valve: There was no stenosis.  - Mitral valve: There was trivial regurgitation.  - Right ventricle: The cavity size was normal. Systolic function    was normal.  - Pulmonary arteries: No complete TR doppler jet so unable to    estimate PA systolic pressure.  - Inferior vena cava: The vessel was normal in size. The    respirophasic diameter changes were in the normal range (= 50%),    consistent with normal central venous pressure.   Impressions:   - Normal LV size with EF 55-60%. Moderate diastolic dysfunction.    Normal RV size and systolic function. No significant valvular    abnormalities.   -------------------------------------------------------------------  Study data:  No prior study was available for comparison.  Study  status:  Routine.  Procedure:  Transthoracic echocardiography.  Image quality was adequate.          Transthoracic  echocardiography.  M-mode, complete 2D, 3D, spectral Doppler, and  color Doppler.  Birthdate:  Patient birthdate: 1965/08/21.  Age:  Patient is 58 yr old.  Sex:  Gender: female.    BMI: 25.5 kg/m^2.  Blood pressure:     108/80  Patient status:  Outpatient.  Study  date:  Study date: 02/20/2016. Study time: 02:16 PM.  Location:  [facility]   -------------------------------------------------------------------   -------------------------------------------------------------------  Left ventricle:  The cavity size was normal. Wall thickness was  normal. Systolic function was normal. The estimated ejection  fraction was in the range of 55% to 60%. Wall motion was normal;  there were no  regional wall motion abnormalities. Features are  consistent with a pseudonormal left ventricular filling pattern,  with concomitant abnormal relaxation and increased filling pressure  (grade 2 diastolic dysfunction).   -------------------------------------------------------------------  Aortic valve:   Trileaflet.  Doppler:   There was no stenosis.  There was no regurgitation.   -------------------------------------------------------------------  Aorta: Aortic root: The aortic root was normal in size.  Ascending aorta: The ascending aorta was normal in size.   -------------------------------------------------------------------  Mitral valve:   Normal thickness leaflets .  Doppler:   There was  no evidence for stenosis.   There was trivial regurgitation.  Peak gradient (D): 3 mm Hg.   -------------------------------------------------------------------  Left atrium:  The atrium was normal in size.   -------------------------------------------------------------------  Right ventricle:  The cavity size was normal. Systolic function was  normal.   -------------------------------------------------------------------  Pulmonic valve:    Structurally normal valve.   Cusp separation was  normal.  Doppler:  Transvalvular velocity was within the normal  range. There was no regurgitation.   -------------------------------------------------------------------  Tricuspid valve:   Doppler:  There was no significant  regurgitation.   -------------------------------------------------------------------  Pulmonary artery:   No complete TR doppler jet so unable to  estimate PA systolic pressure.   -------------------------------------------------------------------  Right atrium:  The atrium was normal in size.   -------------------------------------------------------------------  Pericardium: There was no pericardial effusion.    -------------------------------------------------------------------  Systemic veins:  Inferior vena cava: The vessel was normal in size. The  respirophasic diameter changes were in the normal range (= 50%),  consistent with normal central venous pressure. Diameter: 16.3 mm.       Assessment & Plan   1.  Shortness of breath, dizziness-reports over the last 2 months she has had lightheadedness and shortness of breath.  She also reports some chest congestion x 1  month.  She denies presyncope and syncope.   Blood pressure today 109/74. Crease physical activity as tolerated May try intermittent fasting Order echocardiogram Order CBC, BMP  Palpitations-EKG today shows sinus rhythm 65 bpm.  History of PVCs.  Denies increased episodes of palpitations.  Reports compliance with metoprolol . Maintain hydration Avoid triggers caffeine, chocolate, EtOH, dehydration etc.  Mitral valve prolapse-previously noted to have soft systolic murmur and trivial mitral valve regurgitation.  I will repeat echocardiogram for further prognostication. Continue current medical therapy  Daytime somnolence-Reports snoring and poor sleep.  Not waking up well rested.  STOP-BANG score 4 Order home sleep study  Sinus congestion-appears to be related to environmental type allergies.  She has been taking some home OTC flu and cold medicine.  She has not noticed improvement in the last 4 days.  I recommended that she try her as needed cetirizine hydrochloride.  Weight gain-reports a almost 20 pound weight gain since being off of phentermine . May try intermittent fasting Heart healthy diet Increase physical activity as tolerated  Disposition: Follow-up with Dr. Okey as scheduled.    Josefa HERO. Saprina Chuong NP-C     11/02/2023, 2:40 PM [facility] [address] [place], [place] [address] Office [phone]      I spent 15 minutes examining this patient, reviewing  medications, and using patient centered shared decision making involving their cardiac care.   I spent  20 minutes reviewing past medical history,  medications, and prior cardiac tests.

## 2023-11-02 ENCOUNTER — Encounter: Payer: Self-pay | Admitting: General Practice

## 2023-11-02 ENCOUNTER — Ambulatory Visit: Attending: General Practice | Admitting: General Practice

## 2023-11-02 VITALS — BP 109/74 | HR 65 | Ht 65.0 in | Wt 163.0 lb

## 2023-11-02 DIAGNOSIS — R0609 Other forms of dyspnea: Secondary | ICD-10-CM

## 2023-11-02 DIAGNOSIS — R0683 Snoring: Secondary | ICD-10-CM

## 2023-11-02 DIAGNOSIS — R42 Dizziness and giddiness: Secondary | ICD-10-CM | POA: Diagnosis not present

## 2023-11-02 DIAGNOSIS — I493 Ventricular premature depolarization: Secondary | ICD-10-CM

## 2023-11-02 DIAGNOSIS — I341 Nonrheumatic mitral (valve) prolapse: Secondary | ICD-10-CM

## 2023-11-02 DIAGNOSIS — R002 Palpitations: Secondary | ICD-10-CM | POA: Diagnosis not present

## 2023-11-02 NOTE — Patient Instructions (Signed)
 Medication Instructions:  Your physician recommends that you continue on your current medications as directed. Please refer to the Current Medication list given to you today.  *If you need a refill on your cardiac medications before your next appointment, please call your pharmacy*  Lab Work: TODAY: BMET, CBC If you have labs (blood work) drawn today and your tests are completely normal, you will receive your results only by: MyChart Message (if you have MyChart) OR A paper copy in the mail If you have any lab test that is abnormal or we need to change your treatment, we will call you to review the results.  Testing/Procedures: Your physician has requested that you have an echocardiogram. Echocardiography is a painless test that uses sound waves to create images of your heart. It provides your doctor with information about the size and shape of your heart and how well your heart's chambers and valves are working. This procedure takes approximately one hour. There are no restrictions for this procedure. Please do NOT wear cologne, perfume, aftershave, or lotions (deodorant is allowed). Please arrive 15 minutes prior to your appointment time.  Please note: We ask at that you not bring children with you during ultrasound (echo/ vascular) testing. Due to room size and safety concerns, children are not allowed in the ultrasound rooms during exams. Our front office staff cannot provide observation of children in our lobby area while testing is being conducted. An adult accompanying a patient to their appointment will only be allowed in the ultrasound room at the discretion of the ultrasound technician under special circumstances. We apologize for any inconvenience.   Your physician has recommended that you have a ITAMAR sleep study. This test records several body functions during sleep, including: brain activity, eye movement, oxygen and carbon dioxide blood levels, heart rate and rhythm, breathing rate  and rhythm, the flow of air through your mouth and nose, snoring, body muscle movements, and chest and belly movement.   Follow-Up: At [facility], you and your health needs are our priority.  As part of our continuing mission to provide you with exceptional heart care, our providers are all part of one team.  This team includes your primary Cardiologist (physician) and Advanced Practice Providers or APPs (Physician Assistants and Nurse Practitioners) who all work together to provide you with the care you need, when you need it.  Your next appointment:   KEEP FOLLOW-UP AS SCHEDULED   We recommend signing up for the patient portal called MyChart.  Sign up information is provided on this After Visit Summary.  MyChart is used to connect with patients for Virtual Visits (Telemedicine).  Patients are able to view lab/test results, encounter notes, upcoming appointments, etc.  Non-urgent messages can be sent to your provider as well.   To learn more about what you can do with MyChart, go to [email or web address].   Other Instructions YOU MAY ZYRTEC FOR AS NEEDED FOR ALLERGIES

## 2023-11-03 ENCOUNTER — Ambulatory Visit: Payer: Self-pay | Admitting: General Practice

## 2023-11-03 LAB — BASIC METABOLIC PANEL WITH GFR
BUN/Creatinine Ratio: 13 (ref 12–28)
BUN: 10 mg/dL (ref 8–27)
CO2: 23 mmol/L (ref 20–29)
Calcium: 9.6 mg/dL (ref 8.7–10.3)
Chloride: 100 mmol/L (ref 96–106)
Creatinine, Ser: 0.78 mg/dL (ref 0.57–1.00)
Glucose: 90 mg/dL (ref 70–99)
Potassium: 4.7 mmol/L (ref 3.5–5.2)
Sodium: 138 mmol/L (ref 134–144)
eGFR: 87 mL/min/1.73 (ref >59)

## 2023-11-03 LAB — CBC
Hematocrit: 41.1 % (ref 34.0–46.6)
Hemoglobin: 13.3 g/dL (ref 11.1–15.9)
MCH: 29.2 pg (ref 26.6–33.0)
MCHC: 32.4 g/dL (ref 31.5–35.7)
MCV: 90 fL (ref 79–97)
Platelets: 340 x10E3/uL (ref 150–450)
RBC: 4.56 x10E6/uL (ref 3.77–5.28)
RDW: 12.5 % (ref 11.7–15.4)
WBC: 6.4 x10E3/uL (ref 3.4–10.8)

## 2023-11-06 ENCOUNTER — Telehealth: Payer: Self-pay

## 2023-11-06 NOTE — Telephone Encounter (Signed)
 Error

## 2023-11-19 ENCOUNTER — Encounter: Payer: Self-pay | Admitting: Physician Assistant

## 2023-11-25 ENCOUNTER — Ambulatory Visit: Payer: Self-pay

## 2023-11-25 NOTE — Telephone Encounter (Signed)
 FYI Only or Action Required?: FYI only for provider.  Patient was last seen in primary care on 07/09/2023 by Cyndi Shaver, PA-C.  Called Nurse Triage reporting Recurrent Skin Infections.  Symptoms began several days ago.  Interventions attempted: OTC medications: neosporin, benadryl.  Symptoms are: unchanged.  Triage Disposition: See Physician Within 24 Hours  Patient/caregiver understands and will follow disposition?: Yes     Copied from CRM #[id number]. Topic: Clinical - Red Word Triage >> Nov 25, 2023  9:16 AM [name] Smith wrote: Reason for RMF:ypczd on her right arm and a boil on her lower stomach and it is red, sore and painful Reason for Disposition . [1] Spreading redness around the boil AND [2] no fever  Answer Assessment - Initial Assessment Questions Patient reports a boil that appeared on Saturday or Sunday. She put neosporin on it, a little drainage last night clear. She says she has a seafood allergy, but is able to eat crab in the past. She went out on Sunday night and had crab, which it doesn't normally bother me. Woke up Monday with hives on the right arm, a little itching in the scalp, not sure if it's a sun reaction. Taking benadryl for the hives, not helping.    1. APPEARANCE of BOIL: What does the boil look like?      Whitish grey color with redness around it 2. LOCATION: Where is the boil located?      Mid to right lower abdomen 3. NUMBER: How many boils are there?      1 4. SIZE: How big is the boil? (e.g., inches, cm; compare to size of a coin or other object)     About 2 inches across 5. ONSET: When did the boil start?     Saturday or Sunday 6. PAIN: Is there any pain? If Yes, ask: How bad is the pain?   (Scale 1-10; or mild, moderate, severe)     Not really, unless bending over and touch it 7. FEVER: Do you have a fever? If Yes, ask: What is it, how was it measured, and when did it start?      No 8. SOURCE: Have you been around anyone  with boils or other Staph infections? Have you ever had boils before?     Not around anyone. 2 weeks ago had a spot come up under bra looked similar, but it went away on it's own, drained on it's own 9. OTHER SYMPTOMS: Do you have any other symptoms? (e.g., shaking chills, weakness, rash elsewhere on body)     Hives on right arm  Protocols used: Boil (Skin Abscess)-A-AH

## 2023-11-25 NOTE — Telephone Encounter (Signed)
 FYI. Appt scheduled, previously Manuelita Pt.

## 2023-11-26 ENCOUNTER — Ambulatory Visit: Admitting: Family Medicine

## 2023-11-26 ENCOUNTER — Encounter: Payer: Self-pay | Admitting: Family Medicine

## 2023-11-26 VITALS — BP 136/80 | HR 63 | Temp 97.7°F | Ht 65.0 in | Wt 166.0 lb

## 2023-11-26 DIAGNOSIS — L0291 Cutaneous abscess, unspecified: Secondary | ICD-10-CM

## 2023-11-26 DIAGNOSIS — L239 Allergic contact dermatitis, unspecified cause: Secondary | ICD-10-CM | POA: Diagnosis not present

## 2023-11-26 MED ORDER — SULFAMETHOXAZOLE-TRIMETHOPRIM 800-160 MG PO TABS: 1.0000 | ORAL_TABLET | Freq: Two times a day (BID) | ORAL | 0 refills | Status: AC

## 2023-11-26 NOTE — Progress Notes (Signed)
 Acute Office Visit  Subjective:     Patient ID: [name] Smith, female    DOB: [birth year], 58 y.o.   MRN: [id number]  Chief Complaint  Patient presents with   Abscess     Patient is in today for abscess.    Discussed the use of AI scribe software for clinical note transcription with the patient, who gave verbal consent to proceed.  History of Present Illness [name] Smith is a 58 year old female who presents with a large, painful abscess on her abdomen.  She developed a painful sore on her abdomen last week while at the beach. Initially, she noticed a lesion under her bra that eventually ruptured with a 'green puss' but resolved. Subsequently, she developed another sore on her lower abdomen, which became erythematous and painful, spreading significantly. The abscess opened last night, draining clear fluid. She has been applying gauze with Neosporin and experienced significant pain when removing a Band-Aid. She experienced chills last night and a fever of 41F, for which she took two doses of ibuprofen, resulting in some improvement in her symptoms.   Additionally, she developed a rash on her arms starting Monday morning after using a new sunscreen (Neutrogena SPF 70) at the beach. The rash is pruritic, and she has been taking diphenhydramine. She also reports a history of prednisone allergy, which causes intense joint pain from her shoulder blades upwards. She has not had hives for a long time and has not been in the sun much since starting rosuvastatin  and possibly metoprolol .         ROS All review of systems negative except what is listed in the HPI      Objective:    BP 136/80   Pulse 63   Temp 97.7 F (36.5 C)   Ht 5' 5 (1.651 m)   Wt 166 lb (75.3 kg)   SpO2 99%   BMI 27.62 kg/m    Physical Exam Vitals reviewed.  Constitutional:      Appearance: Normal appearance.  Skin:    Comments: Draining abscess to RLQ/waistline, mildly erythematous papular  rash to both forearms  Neurological:     Mental Status: She is alert and oriented to person, place, and time.  Psychiatric:        Mood and Affect: Mood normal.        Behavior: Behavior normal.        Thought Content: Thought content normal.        Judgment: Judgment normal.        No results found for any visits on 11/26/23.      Assessment & Plan:   Problem List Items Addressed This Visit   None Visit Diagnoses       Abscess    -  Primary - Instruct warm compresses three to four times daily. - Advise cleaning with gentle soap and water. - Continue Neosporin twice daily. - Monitor for worsening symptoms and start antibiotics if needed. - Prescribe Bactrim  twice a day for five days if no improvement. - Contact office if symptoms worsen or fail to improve.   Relevant Medications   sulfamethoxazole -trimethoprim  (BACTRIM  DS) 800-160 MG tablet     Allergic contact dermatitis, unspecified trigger     Acute urticarial rash likely from new sunscreen product.  - Recommend Claritin or Zyrtec once daily for one to two weeks. - Advise Pepcid twice daily. - Instruct use of gentle soap and lotion, avoid new skin products. - Suggest hydrocortisone cream  for itching. - Monitor for rash spreading and contact office if worsens. - Follow-up with allergist if rash recurs.        Meds ordered this encounter  Medications   sulfamethoxazole -trimethoprim  (BACTRIM  DS) 800-160 MG tablet    Sig: Take 1 tablet by mouth 2 (two) times daily for 5 days.    Dispense:  10 tablet    Refill:  0    Supervising Provider:   DOMENICA BLACKBIRD A [[id number]]    Return in about 3 months (around 02/25/2024) for chronic disease management.  Waddell KATHEE Mon, NP

## 2023-12-07 ENCOUNTER — Ambulatory Visit (HOSPITAL_COMMUNITY)
Admission: RE | Admit: 2023-12-07 | Discharge: 2023-12-07 | Disposition: A | Discharge status: Home / Self Care | Source: Ambulatory Visit | Attending: Cardiology | Admitting: Cardiology

## 2023-12-07 DIAGNOSIS — R0609 Other forms of dyspnea: Secondary | ICD-10-CM | POA: Diagnosis not present

## 2023-12-07 LAB — ECHOCARDIOGRAM COMPLETE
Area-P 1/2: 2.93 cm2
S' Lateral: 2.8 cm

## 2023-12-08 ENCOUNTER — Telehealth: Payer: Self-pay | Admitting: Internal Medicine

## 2023-12-08 NOTE — Telephone Encounter (Signed)
Patient returned call for echo results.  

## 2023-12-08 NOTE — Progress Notes (Signed)
 Called patient regarding her echocardiogram results. NA, left message to contact our office.

## 2023-12-08 NOTE — Telephone Encounter (Signed)
 [name] CHRISTELLA Beauvais, NP 12/08/2023  6:35 AM EDT     Please contact [name] Smith and let her know that her echocardiogram has been reviewed.  She was noted to have normal pumping function.  Her mitral valve was noted to be normal.  Her aortic valve was also normal.  Both her tricuspid valve and pulmonic valve were also normal.  Reassuring result.  We will continue with her current medication regimen.  Thank you    Reviewed results with pt who states understanding.  She had no further questions at the time of the call.

## 2024-01-01 ENCOUNTER — Telehealth: Payer: Self-pay

## 2024-01-01 NOTE — Telephone Encounter (Signed)
 Ordering provider: Josefa Beauvais, NP Associated diagnoses: Snoring [R06.83]  3.  Patient NOT notified of PIN (1234) on 01/01/2024   Phone note routed to covering staff for follow-up.

## 2024-02-02 ENCOUNTER — Ambulatory Visit: Attending: Internal Medicine | Admitting: Internal Medicine

## 2024-02-02 ENCOUNTER — Encounter: Payer: Self-pay | Admitting: Internal Medicine

## 2024-02-02 VITALS — BP 124/76 | HR 61 | Ht 65.0 in | Wt 168.4 lb

## 2024-02-02 DIAGNOSIS — I341 Nonrheumatic mitral (valve) prolapse: Secondary | ICD-10-CM | POA: Diagnosis not present

## 2024-02-02 DIAGNOSIS — I10 Essential (primary) hypertension: Secondary | ICD-10-CM

## 2024-02-02 DIAGNOSIS — I493 Ventricular premature depolarization: Secondary | ICD-10-CM

## 2024-02-02 NOTE — Patient Instructions (Addendum)
 Medication Instructions:  Continue all medications *If you need a refill on your cardiac medications before your next appointment, please call your pharmacy*  Lab Work: None ordered  Testing/Procedures: None ordered  Follow-Up: At [facility], you and your health needs are our priority.  As part of our continuing mission to provide you with exceptional heart care, our providers are all part of one team.  This team includes your primary Cardiologist (physician) and Advanced Practice Providers or APPs (Physician Assistants and Nurse Practitioners) who all work together to provide you with the care you need, when you need it.  Your next appointment:  9 months   Call in April to schedule August appointment     Provider:  Dr.Ross    We recommend signing up for the patient portal called MyChart.  Sign up information is provided on this After Visit Summary.  MyChart is used to connect with patients for Virtual Visits (Telemedicine).  Patients are able to view lab/test results, encounter notes, upcoming appointments, etc.  Non-urgent messages can be sent to your provider as well.   To learn more about what you can do with MyChart, go to [email or web address].

## 2024-02-02 NOTE — Telephone Encounter (Signed)
 Patient had appointment with Dr.Ross this morning.Sleep coordinator gave patient watchpat.

## 2024-02-02 NOTE — Progress Notes (Unsigned)
 Cardiology Office Note   Date:  02/02/2024  ID:  [name] Smith, DOB 1965/09/03, MRN [id number]  PCP:  Almarie Waddell NOVAK, NP  Cardiologist:   Vina Gull, MD   Pt presents for follow up of palpitations and HTN     History of Present Illness: [name] Smith is a 58 y.o. female with a history of HTN, HL, palpitations, neuropathy  SOB and dizziness She was previously followed by P Nahser.   Dec 2017  Echo LVEF 55 to 60%  G2 DD  trivial MR Palpitations   Rx metoprolol   Seen by JINNY Beauvais recently  COmplained of DOE   Echo ordered  Normal   The pt denies CP   She says on level ground her breathing is OK   When she goes up/down stairs she will get SOB   But not all the time   This weekend she was getting out Christmas ornaments and did not have a problem  Breathing was OK  She has struggled with weight   Says she gained weight after neck surgery when she was placed on Lyrica   Hard to get off   Was on phentermine  2 Weilbacher ago   When she stopped weight came back on  Diet: Breakfast   Egg, biscuit or granola bar or cereal  Sweet tea Lunch  Leftovers or sandwich   Sweet tea Dinner  Lean Cuisine  Sweet tea Snack:  Brownie   Current Meds  Medication Sig   busPIRone  (BUSPAR ) 5 MG tablet Take 1 tablet (5 mg total) by mouth 3 (three) times daily. (Patient taking differently: Take 5 mg by mouth as needed.)   CELEBREX 200 MG capsule take 1 capsule by oral route  every day as needed (90 day supply) (Patient taking differently: as needed for mild pain (pain score 1-3) or moderate pain (pain score 4-6).)   Cetirizine HCl (ZYRTEC PO) Take 1 tablet by mouth daily as needed. Allertec brand   Cholecalciferol (VITAMIN D ) 2000 UNITS CAPS Take 12,000 Units by mouth daily.   Cyanocobalamin 2500 MCG TABS Take by mouth. (Patient taking differently: Take by mouth daily.)   desonide (DESOWEN) 0.05 % ointment Apply 1 Application topically 2 (two) times daily.   HYDROcodone -acetaminophen  (NORCO/VICODIN)  5-325 MG tablet Take 1 tablet by mouth every 6 (six) hours as needed for moderate pain.   meclizine  (ANTIVERT ) 12.5 MG tablet Take 1-2 tablets by mouth 3 (three) times daily as needed for dizziness (Patient taking differently: as needed for dizziness. Take 1-2 tablets by mouth 3 (three) times daily as needed for dizziness)   metoprolol  succinate (TOPROL -XL) 25 MG 24 hr tablet TAKE 1 TABLET (25 MG TOTAL) BY MOUTH DAILY.   propranolol  (INDERAL ) 10 MG tablet Take 1 tablet (10 mg total) by mouth 4 (four) times daily as needed (tachycardia).   rosuvastatin  (CRESTOR ) 5 MG tablet TAKE 1 TABLET (5 MG TOTAL) BY MOUTH DAILY.     Allergies:   Erythromycin, Misc. sulfonamide containing compounds, Penicillins, Prednisone, Tetracyclines & related, and Lexapro  [escitalopram ]   Past Medical History:  Diagnosis Date   Allergy    Anemia    Arthritis    in hands   DDD (degenerative disc disease)    Headache    Hyperlipidemia    diet controlled   Hypertension    Neuropathy    PONV (postoperative nausea and vomiting)    Unspecified vitamin D  deficiency     Past Surgical History:  Procedure Laterality Date  ABDOMINAL HYSTERECTOMY     ANTERIOR CERVICAL DECOMP/DISCECTOMY FUSION  03/29/2012   Procedure: ANTERIOR CERVICAL DECOMPRESSION/DISCECTOMY FUSION 1 LEVEL;  Surgeon: Victory DELENA Gunnels, MD;  Location: [facility];  Service: Neurosurgery;  Laterality: Bilateral;  Cervical four-five anterior cervical discectomy with fusion    CERVICAL DISC SURGERY     CESAREAN SECTION     DIAGNOSTIC LAPAROSCOPY     TONSILLECTOMY       Social History:  The patient  reports that she quit smoking about 12 years ago. Her smoking use included cigarettes. She started smoking about 15 years ago. She has never used smokeless tobacco. She reports that she does not drink alcohol and does not use drugs.   Family History:  The patient's family history includes Atrial fibrillation in her mother; Cancer in her father and maternal  grandfather; Diabetes in her father and maternal aunt; Heart disease in her father; Hyperlipidemia in her father and mother; Hypertension in her father and mother.    ROS:  Please see the history of present illness. All other systems are reviewed and  Negative to the above problem except as noted.    PHYSICAL EXAM: VS:  BP 124/76 (BP Location: Left Arm, Patient Position: Sitting, Cuff Size: Large)   Pulse 61   Ht 5' 5 (1.651 m)   Wt 168 lb 6.4 oz (76.4 kg)   SpO2 97%   BMI 28.02 kg/m   HZW:NCzmtzphyu 58 yo  in no acute distress  HEENT: normal  Neck: no JVD, carotid bruit Cardiac: RRR; no murmur Respiratory:  clear to auscultation  GI: soft, nontender, No hepatomegaly  MS: no deformity Moving all extremities   Ext  No LE edema  2+ pulses   EKG:  EKG is ordered today.  NSR 61 bpm    Echo   Sept 2025   1. Left ventricular ejection fraction, by estimation, is 55 to 60%. Left  ventricular ejection fraction by 3D volume is 55 %. The left ventricle has  normal function. The left ventricle has no regional wall motion  abnormalities. Left ventricular diastolic   parameters were normal. The average left ventricular global longitudinal  strain is -20.3 %. The global longitudinal strain is normal.   2. Right ventricular systolic function is normal. The right ventricular  size is normal.   3. The mitral valve is grossly normal. No evidence of mitral valve  regurgitation. No evidence of mitral stenosis.   4. The aortic valve is tricuspid. Aortic valve regurgitation is not  visualized. No aortic stenosis is present.   5. The inferior vena cava is normal in size with greater than 50%  respiratory variability, suggesting right atrial pressure of 3 mmHg.    Lipid Panel    Component Value Date/Time   CHOL 134 03/09/2023 1435   TRIG 88 03/09/2023 1435   HDL 48 (L) 03/09/2023 1435   CHOLHDL 2.8 03/09/2023 1435   VLDL 14 11/28/2015 1506   LDLCALC 69 03/09/2023 1435      Wt Readings  from Last 3 Encounters:  02/02/24 168 lb 6.4 oz (76.4 kg)  11/26/23 166 lb (75.3 kg)  11/02/23 163 lb (73.9 kg)      ASSESSMENT AND PLAN:  1 Dypsnea   Erratic  With stairs but not all the time    I am not convinced this represents a cardiac problem   Recent echo normal     Would keep active     2  HTN  Good control  3   HL   Pt on Crestor    Last lipids in Dec 2024 LDLD was 69  HDL 48  Trig 88   Continued  4 MEtabolics   Last A1C 5.6 in Dec 2024 She needs to cut out sugary drinks, cut back on carbs  More protein.   No sweet snacks  That will be only way she can lose weight   Sleep study scheduled       Current medicines are reviewed at length with the patient today.  The patient does not have concerns regarding medicines.  Signed, Vina Gull, MD

## 2024-02-22 ENCOUNTER — Telehealth: Payer: Self-pay | Admitting: Internal Medicine

## 2024-02-22 MED ORDER — METOPROLOL SUCCINATE ER 25 MG PO TB24: 25.0000 mg | ORAL_TABLET | Freq: Every day | ORAL | 8 refills | Status: AC

## 2024-02-22 NOTE — Telephone Encounter (Signed)
*  STAT* If patient is at the pharmacy, call can be transferred to refill team.   1. Which medications need to be refilled? (please list name of each medication and dose if known)   metoprolol  succinate (TOPROL -XL) 25 MG 24 hr tablet   2. Would you like to learn more about the convenience, safety, & potential cost savings by using the [facility]?   3. Are you open to using the [facility] (Type [facility]. ).  4. Which pharmacy/location (including street and city if local pharmacy) is medication to be sent to?  [id number] [address], [place] - [address]   5. Do they need a 30 day or 90 day supply?   30 day  Patient stated she has 4 days left of medication.

## 2024-02-22 NOTE — Telephone Encounter (Signed)
 Refills has been sent to the pharmacy.

## 2024-03-02 ENCOUNTER — Encounter: Payer: Self-pay | Admitting: Family Medicine

## 2024-03-02 ENCOUNTER — Ambulatory Visit: Admitting: Family Medicine

## 2024-03-02 VITALS — BP 118/66 | HR 60 | Temp 97.6°F | Ht 65.0 in | Wt 169.0 lb

## 2024-03-02 DIAGNOSIS — E663 Overweight: Secondary | ICD-10-CM | POA: Diagnosis not present

## 2024-03-02 DIAGNOSIS — I10 Essential (primary) hypertension: Secondary | ICD-10-CM | POA: Diagnosis not present

## 2024-03-02 DIAGNOSIS — E785 Hyperlipidemia, unspecified: Secondary | ICD-10-CM

## 2024-03-02 DIAGNOSIS — Z0000 Encounter for general adult medical examination without abnormal findings: Secondary | ICD-10-CM | POA: Diagnosis not present

## 2024-03-02 DIAGNOSIS — E559 Vitamin D deficiency, unspecified: Secondary | ICD-10-CM | POA: Diagnosis not present

## 2024-03-02 DIAGNOSIS — Z1211 Encounter for screening for malignant neoplasm of colon: Secondary | ICD-10-CM

## 2024-03-02 NOTE — Assessment & Plan Note (Signed)
 Supplement (inconsistent use currently) and monitor.

## 2024-03-02 NOTE — Assessment & Plan Note (Signed)
 BMI 28. Discussed weight management options including GLP-1 agonists given that she has comorbidities. . No family history of medullary thyroid  cancer or multiple endocrine neoplasia. No pancreatitis. Discussed cost and insurance coverage options. Animator for coverage of Wegovy or Zepbound. - Consider self-pay options if not covered by insurance. - Schedule follow-up every three months if medication is initiated.
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
## 2024-03-02 NOTE — Assessment & Plan Note (Signed)
 Managed with rosuvastatin  5 mg. Cholesterol levels previously well controlled.  - Ordered lipid panel. - Consider discontinuing rosuvastatin  if levels remain controlled - she would like to get off medication if possible.

## 2024-03-02 NOTE — Assessment & Plan Note (Signed)
 Hypertension managed with metoprolol  25 mg extended release daily. No home monitoring or symptoms reported. - Continue metoprolol  25 mg extended release daily.

## 2024-03-03 LAB — CBC WITH DIFFERENTIAL/PLATELET
Basophils Absolute: 0 K/uL (ref 0.0–0.1)
Basophils Relative: 0.7 % (ref 0.0–3.0)
Eosinophils Absolute: 0.2 K/uL (ref 0.0–0.7)
Eosinophils Relative: 2.9 % (ref 0.0–5.0)
HCT: 36.3 % (ref 36.0–46.0)
Hemoglobin: 12.5 g/dL (ref 12.0–15.0)
Lymphocytes Relative: 32.6 % (ref 12.0–46.0)
Lymphs Abs: 1.9 K/uL (ref 0.7–4.0)
MCHC: 34.5 g/dL (ref 30.0–36.0)
MCV: 87.1 fl (ref 78.0–100.0)
Monocytes Absolute: 0.5 K/uL (ref 0.1–1.0)
Monocytes Relative: 8.4 % (ref 3.0–12.0)
Neutro Abs: 3.2 K/uL (ref 1.4–7.7)
Neutrophils Relative %: 55.4 % (ref 43.0–77.0)
Platelets: 296 K/uL (ref 150.0–400.0)
RBC: 4.16 Mil/uL (ref 3.87–5.11)
RDW: 13.2 % (ref 11.5–15.5)
WBC: 5.8 K/uL (ref 4.0–10.5)

## 2024-03-03 LAB — LIPID PANEL
Cholesterol: 112 mg/dL (ref 28–200)
HDL: 42.5 mg/dL (ref >39.00)
LDL Cholesterol: 56 mg/dL (ref 10–99)
NonHDL: 69.16
Total CHOL/HDL Ratio: 3
Triglycerides: 66 mg/dL (ref 10.0–149.0)
VLDL: 13.2 mg/dL (ref 0.0–40.0)

## 2024-03-03 LAB — COMPREHENSIVE METABOLIC PANEL WITH GFR
ALT: 11 U/L (ref 3–35)
AST: 13 U/L (ref 5–37)
Albumin: 4.5 g/dL (ref 3.5–5.2)
Alkaline Phosphatase: 68 U/L (ref 39–117)
BUN: 11 mg/dL (ref 6–23)
CO2: 29 meq/L (ref 19–32)
Calcium: 9.3 mg/dL (ref 8.4–10.5)
Chloride: 104 meq/L (ref 96–112)
Creatinine, Ser: 0.82 mg/dL (ref 0.40–1.20)
GFR: 77.46 mL/min (ref >60.00)
Glucose, Bld: 90 mg/dL (ref 70–99)
Potassium: 4.1 meq/L (ref 3.5–5.1)
Sodium: 140 meq/L (ref 135–145)
Total Bilirubin: 0.4 mg/dL (ref 0.2–1.2)
Total Protein: 6.8 g/dL (ref 6.0–8.3)

## 2024-03-03 LAB — TSH: TSH: 0.75 u[IU]/mL (ref 0.35–5.50)

## 2024-03-03 LAB — VITAMIN D 25 HYDROXY (VIT D DEFICIENCY, FRACTURES): VITD: 25.78 ng/mL — ABNORMAL LOW (ref 30.00–100.00)

## 2024-03-04 ENCOUNTER — Ambulatory Visit: Payer: Self-pay | Admitting: Family Medicine

## 2024-03-04 DIAGNOSIS — R195 Other fecal abnormalities: Secondary | ICD-10-CM

## 2024-03-09 ENCOUNTER — Other Ambulatory Visit: Payer: Self-pay | Admitting: Nurse Practitioner

## 2024-03-09 ENCOUNTER — Encounter: Payer: 59 | Admitting: Nurse Practitioner

## 2024-03-09 DIAGNOSIS — E785 Hyperlipidemia, unspecified: Secondary | ICD-10-CM

## 2024-03-11 ENCOUNTER — Encounter: Admitting: Physician Assistant

## 2024-03-21 ENCOUNTER — Encounter: Payer: 59 | Admitting: Nurse Practitioner

## 2024-03-28 ENCOUNTER — Telehealth: Payer: Self-pay | Admitting: Neurology

## 2024-03-28 DIAGNOSIS — I10 Essential (primary) hypertension: Secondary | ICD-10-CM

## 2024-03-28 DIAGNOSIS — E663 Overweight: Secondary | ICD-10-CM

## 2024-03-28 DIAGNOSIS — M5030 Other cervical disc degeneration, unspecified cervical region: Secondary | ICD-10-CM

## 2024-03-28 NOTE — Telephone Encounter (Unsigned)
 Copied from CRM #[id number]. Topic: Clinical - Medication Prior Auth >> Mar 28, 2024  9:30 AM Adelita E wrote: Reason for CRM: Patient spoke about weight loss medications at her visit in December with Waddell Mon. Patient reached out to her insurance and stated that [facility] and Zepbound  will have a $50 copay, all she would need is a prior authorization. Patient did not have a preference for which medication.

## 2024-03-29 MED ORDER — ZEPBOUND 2.5 MG/0.5ML ~~LOC~~ SOAJ: 2.5000 mg | SUBCUTANEOUS | 2 refills | Status: AC

## 2024-03-29 NOTE — Telephone Encounter (Signed)
 Zepbound prescription sent

## 2024-03-29 NOTE — Telephone Encounter (Signed)
"  Patient made aware RX sent.   "

## 2024-03-31 LAB — COLOGUARD: COLOGUARD: POSITIVE — AB

## 2024-04-22 NOTE — Telephone Encounter (Signed)
 Spoke with patient, let her know that not really a lot we can do with insurance requirements. She will reach back out to insurance/[facility]. Her BMI was too low for healthy/weight wellness clinic.   Copied from CRM [id number]. Topic: Clinical - Prescription Issue >> Apr 22, 2024 11:41 AM Revonda D wrote: Reason for CRM: Pt is requesting to speak with Waddell Mon, NP, or her nurse in regards to the Zepbound . Pt stated that when her son tried to pick up the medication the pharmacy informed him that he is unable to get the medication because the pt needs to enroll in a program with [facility]. Pt stated that she contacted the program ( [employer]) and they informed her that she would have already had to be on the medication. Pt would like a callback regarding this concern.

## 2024-08-11 ENCOUNTER — Ambulatory Visit: Admitting: Family Medicine
